# Patient Record
Sex: Female | Born: 1980 | Race: Black or African American | Hispanic: No | Marital: Married | Smoking: Never smoker
Health system: Southern US, Community
[De-identification: ages and names within clinical notes are randomized; demographics above are authoritative.]

## PROBLEM LIST (undated history)

## (undated) DIAGNOSIS — I639 Cerebral infarction, unspecified: Secondary | ICD-10-CM

## (undated) DIAGNOSIS — I10 Essential (primary) hypertension: Secondary | ICD-10-CM

## (undated) DIAGNOSIS — E785 Hyperlipidemia, unspecified: Secondary | ICD-10-CM

## (undated) DIAGNOSIS — I671 Cerebral aneurysm, nonruptured: Secondary | ICD-10-CM

## (undated) DIAGNOSIS — G935 Compression of brain: Secondary | ICD-10-CM

## (undated) DIAGNOSIS — I2699 Other pulmonary embolism without acute cor pulmonale: Secondary | ICD-10-CM

## (undated) DIAGNOSIS — J45909 Unspecified asthma, uncomplicated: Secondary | ICD-10-CM

## (undated) DIAGNOSIS — F419 Anxiety disorder, unspecified: Secondary | ICD-10-CM

## (undated) DIAGNOSIS — E039 Hypothyroidism, unspecified: Secondary | ICD-10-CM

## (undated) DIAGNOSIS — I499 Cardiac arrhythmia, unspecified: Secondary | ICD-10-CM

## (undated) DIAGNOSIS — I509 Heart failure, unspecified: Secondary | ICD-10-CM

## (undated) DIAGNOSIS — R0602 Shortness of breath: Secondary | ICD-10-CM

## (undated) DIAGNOSIS — M1990 Unspecified osteoarthritis, unspecified site: Secondary | ICD-10-CM

## (undated) HISTORY — DX: Compression of brain: G93.5

## (undated) HISTORY — DX: Anxiety disorder, unspecified: F41.9

## (undated) HISTORY — PX: CHOLECYSTECTOMY: SHX55

## (undated) HISTORY — DX: Cerebral infarction, unspecified: I63.9

## (undated) HISTORY — DX: Heart failure, unspecified: I50.9

## (undated) HISTORY — PX: TONSILLECTOMY: SUR1361

## (undated) HISTORY — DX: Shortness of breath: R06.02

## (undated) HISTORY — DX: Cerebral aneurysm, nonruptured: I67.1

## (undated) HISTORY — DX: Cardiac arrhythmia, unspecified: I49.9

## (undated) HISTORY — PX: EYE SURGERY: SHX253

## (undated) HISTORY — DX: Hyperlipidemia, unspecified: E78.5

## (undated) HISTORY — DX: Hypothyroidism, unspecified: E03.9

## (undated) HISTORY — DX: Essential (primary) hypertension: I10

## (undated) HISTORY — DX: Unspecified osteoarthritis, unspecified site: M19.90

---

## 2000-01-20 ENCOUNTER — Emergency Department (HOSPITAL_COMMUNITY): Admission: EM | Admit: 2000-01-20 | Discharge: 2000-01-20 | Payer: Self-pay | Admitting: Emergency Medicine

## 2000-01-21 ENCOUNTER — Emergency Department (HOSPITAL_COMMUNITY): Admission: EM | Admit: 2000-01-21 | Discharge: 2000-01-21 | Payer: Self-pay | Admitting: Emergency Medicine

## 2003-03-07 ENCOUNTER — Other Ambulatory Visit: Admission: RE | Admit: 2003-03-07 | Discharge: 2003-03-07 | Payer: Self-pay | Admitting: Gynecology

## 2003-09-04 ENCOUNTER — Other Ambulatory Visit: Admission: RE | Admit: 2003-09-04 | Discharge: 2003-09-04 | Payer: Self-pay | Admitting: Gynecology

## 2003-12-15 ENCOUNTER — Emergency Department (HOSPITAL_COMMUNITY): Admission: EM | Admit: 2003-12-15 | Discharge: 2003-12-15 | Payer: Self-pay | Admitting: Emergency Medicine

## 2004-03-25 ENCOUNTER — Other Ambulatory Visit: Admission: RE | Admit: 2004-03-25 | Discharge: 2004-03-25 | Payer: Self-pay | Admitting: Gynecology

## 2004-12-07 ENCOUNTER — Ambulatory Visit (HOSPITAL_COMMUNITY): Admission: RE | Admit: 2004-12-07 | Discharge: 2004-12-07 | Payer: Self-pay | Admitting: Gynecology

## 2004-12-07 ENCOUNTER — Encounter (INDEPENDENT_AMBULATORY_CARE_PROVIDER_SITE_OTHER): Payer: Self-pay | Admitting: Specialist

## 2004-12-07 ENCOUNTER — Ambulatory Visit (HOSPITAL_BASED_OUTPATIENT_CLINIC_OR_DEPARTMENT_OTHER): Admission: RE | Admit: 2004-12-07 | Discharge: 2004-12-07 | Payer: Self-pay | Admitting: Gynecology

## 2005-03-26 ENCOUNTER — Other Ambulatory Visit: Admission: RE | Admit: 2005-03-26 | Discharge: 2005-03-26 | Payer: Self-pay | Admitting: Gynecology

## 2005-11-12 ENCOUNTER — Other Ambulatory Visit: Admission: RE | Admit: 2005-11-12 | Discharge: 2005-11-12 | Payer: Self-pay | Admitting: Gynecology

## 2006-01-25 ENCOUNTER — Encounter (INDEPENDENT_AMBULATORY_CARE_PROVIDER_SITE_OTHER): Payer: Self-pay | Admitting: *Deleted

## 2006-01-25 ENCOUNTER — Inpatient Hospital Stay (HOSPITAL_COMMUNITY): Admission: AD | Admit: 2006-01-25 | Discharge: 2006-01-27 | Payer: Self-pay | Admitting: Gynecology

## 2006-03-11 ENCOUNTER — Other Ambulatory Visit: Admission: RE | Admit: 2006-03-11 | Discharge: 2006-03-11 | Payer: Self-pay | Admitting: Gynecology

## 2007-03-01 ENCOUNTER — Ambulatory Visit (HOSPITAL_COMMUNITY): Admission: RE | Admit: 2007-03-01 | Discharge: 2007-03-01 | Payer: Self-pay | Admitting: Gynecology

## 2007-04-10 ENCOUNTER — Other Ambulatory Visit: Admission: RE | Admit: 2007-04-10 | Discharge: 2007-04-10 | Payer: Self-pay | Admitting: Gynecology

## 2008-01-04 ENCOUNTER — Emergency Department (HOSPITAL_COMMUNITY): Admission: EM | Admit: 2008-01-04 | Discharge: 2008-01-04 | Payer: Self-pay | Admitting: Emergency Medicine

## 2008-04-18 ENCOUNTER — Other Ambulatory Visit: Admission: RE | Admit: 2008-04-18 | Discharge: 2008-04-18 | Payer: Self-pay | Admitting: Gynecology

## 2008-06-03 ENCOUNTER — Ambulatory Visit: Payer: Self-pay | Admitting: Gynecology

## 2008-07-24 ENCOUNTER — Ambulatory Visit: Payer: Self-pay | Admitting: Women's Health

## 2008-08-12 ENCOUNTER — Ambulatory Visit: Payer: Self-pay | Admitting: Women's Health

## 2008-09-30 ENCOUNTER — Ambulatory Visit: Payer: Self-pay | Admitting: Gynecology

## 2008-10-02 ENCOUNTER — Ambulatory Visit: Payer: Self-pay | Admitting: Gynecology

## 2009-04-21 ENCOUNTER — Other Ambulatory Visit: Admission: RE | Admit: 2009-04-21 | Discharge: 2009-04-21 | Payer: Self-pay | Admitting: Gynecology

## 2009-04-21 ENCOUNTER — Encounter: Payer: Self-pay | Admitting: Women's Health

## 2009-04-21 ENCOUNTER — Ambulatory Visit: Payer: Self-pay | Admitting: Women's Health

## 2009-06-02 ENCOUNTER — Ambulatory Visit: Payer: Self-pay | Admitting: Gynecology

## 2009-08-11 ENCOUNTER — Ambulatory Visit (HOSPITAL_COMMUNITY): Admission: RE | Admit: 2009-08-11 | Discharge: 2009-08-11 | Payer: Self-pay | Admitting: Obstetrics and Gynecology

## 2010-01-23 ENCOUNTER — Inpatient Hospital Stay (HOSPITAL_COMMUNITY): Admission: AD | Admit: 2010-01-23 | Discharge: 2010-01-23 | Payer: Self-pay | Admitting: Obstetrics and Gynecology

## 2010-02-01 ENCOUNTER — Inpatient Hospital Stay (HOSPITAL_COMMUNITY): Admission: AD | Admit: 2010-02-01 | Discharge: 2010-02-01 | Payer: Self-pay | Admitting: Obstetrics

## 2010-02-04 ENCOUNTER — Inpatient Hospital Stay (HOSPITAL_COMMUNITY): Admission: AD | Admit: 2010-02-04 | Discharge: 2010-02-06 | Payer: Self-pay | Admitting: Obstetrics and Gynecology

## 2010-02-17 ENCOUNTER — Inpatient Hospital Stay (HOSPITAL_COMMUNITY): Admission: AD | Admit: 2010-02-17 | Discharge: 2010-02-19 | Payer: Self-pay | Admitting: Obstetrics

## 2010-02-17 ENCOUNTER — Ambulatory Visit: Payer: Self-pay | Admitting: Critical Care Medicine

## 2010-02-17 ENCOUNTER — Ambulatory Visit: Payer: Self-pay | Admitting: Cardiology

## 2010-02-18 ENCOUNTER — Encounter (INDEPENDENT_AMBULATORY_CARE_PROVIDER_SITE_OTHER): Payer: Self-pay | Admitting: Obstetrics

## 2010-02-18 ENCOUNTER — Ambulatory Visit: Payer: Self-pay | Admitting: Vascular Surgery

## 2010-02-18 ENCOUNTER — Encounter (INDEPENDENT_AMBULATORY_CARE_PROVIDER_SITE_OTHER): Payer: Self-pay | Admitting: Obstetrics and Gynecology

## 2010-02-23 ENCOUNTER — Ambulatory Visit: Payer: Self-pay | Admitting: Cardiology

## 2010-02-23 LAB — CONVERTED CEMR LAB

## 2010-02-27 ENCOUNTER — Ambulatory Visit: Payer: Self-pay | Admitting: Cardiology

## 2010-02-27 LAB — CONVERTED CEMR LAB: POC INR: 2.2

## 2010-03-03 ENCOUNTER — Telehealth: Payer: Self-pay | Admitting: Critical Care Medicine

## 2010-03-03 ENCOUNTER — Ambulatory Visit: Payer: Self-pay | Admitting: Critical Care Medicine

## 2010-03-03 ENCOUNTER — Telehealth: Payer: Self-pay | Admitting: Cardiology

## 2010-03-03 ENCOUNTER — Ambulatory Visit: Payer: Self-pay | Admitting: Internal Medicine

## 2010-03-03 DIAGNOSIS — Z86718 Personal history of other venous thrombosis and embolism: Secondary | ICD-10-CM | POA: Insufficient documentation

## 2010-03-06 ENCOUNTER — Ambulatory Visit: Payer: Self-pay | Admitting: Cardiology

## 2010-03-16 ENCOUNTER — Ambulatory Visit: Payer: Self-pay | Admitting: Cardiology

## 2010-03-30 ENCOUNTER — Ambulatory Visit: Payer: Self-pay | Admitting: Cardiovascular Disease

## 2010-03-30 LAB — CONVERTED CEMR LAB: POC INR: 2.8

## 2010-04-14 ENCOUNTER — Telehealth (INDEPENDENT_AMBULATORY_CARE_PROVIDER_SITE_OTHER): Payer: Self-pay | Admitting: *Deleted

## 2010-04-20 ENCOUNTER — Ambulatory Visit: Payer: Self-pay | Admitting: Cardiology

## 2010-04-20 LAB — CONVERTED CEMR LAB: POC INR: 2.6

## 2010-05-01 ENCOUNTER — Telehealth (INDEPENDENT_AMBULATORY_CARE_PROVIDER_SITE_OTHER): Payer: Self-pay | Admitting: *Deleted

## 2010-05-05 ENCOUNTER — Encounter: Payer: Self-pay | Admitting: Critical Care Medicine

## 2010-05-05 ENCOUNTER — Ambulatory Visit: Payer: Self-pay | Admitting: Critical Care Medicine

## 2010-05-05 DIAGNOSIS — J45909 Unspecified asthma, uncomplicated: Secondary | ICD-10-CM | POA: Insufficient documentation

## 2010-05-18 ENCOUNTER — Ambulatory Visit: Payer: Self-pay | Admitting: Cardiovascular Disease

## 2010-05-19 ENCOUNTER — Ambulatory Visit: Payer: Self-pay | Admitting: Critical Care Medicine

## 2010-05-19 ENCOUNTER — Encounter: Payer: Self-pay | Admitting: Critical Care Medicine

## 2010-06-15 ENCOUNTER — Ambulatory Visit: Payer: Self-pay | Admitting: Cardiology

## 2010-06-17 ENCOUNTER — Telehealth: Payer: Self-pay | Admitting: Critical Care Medicine

## 2010-06-22 ENCOUNTER — Ambulatory Visit: Payer: Self-pay | Admitting: Critical Care Medicine

## 2010-07-13 ENCOUNTER — Ambulatory Visit: Payer: Self-pay | Admitting: Internal Medicine

## 2010-07-20 ENCOUNTER — Ambulatory Visit: Payer: Self-pay | Admitting: Cardiovascular Disease

## 2010-07-27 ENCOUNTER — Ambulatory Visit: Payer: Self-pay | Admitting: Cardiology

## 2010-08-03 ENCOUNTER — Ambulatory Visit: Payer: Self-pay | Admitting: Internal Medicine

## 2010-08-10 ENCOUNTER — Ambulatory Visit: Payer: Self-pay | Admitting: Cardiology

## 2010-08-10 LAB — CONVERTED CEMR LAB: POC INR: 3.3

## 2010-08-18 ENCOUNTER — Telehealth (INDEPENDENT_AMBULATORY_CARE_PROVIDER_SITE_OTHER): Payer: Self-pay | Admitting: *Deleted

## 2010-09-01 ENCOUNTER — Ambulatory Visit: Admission: RE | Admit: 2010-09-01 | Discharge: 2010-09-01 | Payer: Self-pay | Source: Home / Self Care

## 2010-09-01 LAB — CONVERTED CEMR LAB: POC INR: 3.3

## 2010-09-15 ENCOUNTER — Ambulatory Visit: Admission: RE | Admit: 2010-09-15 | Discharge: 2010-09-15 | Payer: Self-pay | Source: Home / Self Care

## 2010-09-29 NOTE — Assessment & Plan Note (Signed)
Summary: Pulmonary OV   Primary Kelsey Friedman Tool/Referring Kelsey Friedman:  [name]   CC:  2 wk follow up.  Pt states no improvement in breathing and still having "squeezing" feeling in chest.  .  History of Present Illness: Pulmonary OV  03/03/10: Hx of Pulmonary embolii  RUL.  Pt was postpartum.  The pt had prolonged bedrest due to dilated cervix prepartum.  Delivery was as expected vaginally.  Pt was d/c home only to return one week later with acute dyspnea 6/11.  Pt found to have RUL PE>  No DVT seen.  Pt rx with lovenox and coumadin.  Pt breastfeeding and cleared by pharmacy for coumadin.  Now noting more dyspnea and chest pain  starting one week ago and worse last two days. Notes some cough,  is dry.  Pain is on the L side.  BP is high. Pt is on the coumadin.  Notes some itching.  INR has been therapeutic per coumadin clinic.  May 05, 2010 2:50 PM When sleeps feels like throat will close off.  COmes and goes.  Still with L sided chest pain and some palpitations.  Stays on the coumadin.   INR 2.6 on 04/20/10. Notes some cough, and is dry.  No real pn drip.  No real wheeze.   03/03/10 CT was neg for PE vs 02/17/10 CT.  Pt notes some wheezing and dyspnea.  No prior hx of asthma.  No real mucus.  Notes some sinus drainage and sinus obstruction.  Pt works as a Office manager.  May 19, 2010 4:22 PM The pt feels the same.  There is not much improvement in dyspnea. No real wheeze.  Feels sore if takes a breath in.    Uses proair about twice a day at work.  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  The cough was present and now is gone.  The dyspnea is not as bad as before but is still present.    Asthma History    Initial Asthma Severity Rating:    Age range: 12+ years    Symptoms: daily    Nighttime Awakenings: 0-2/month    Interferes w/ normal activity: no limitations    SABA use (not for EIB): >2 days/week but not >1X/day    Exacerbations requiring oral systemic steroids: 0-1/year    Asthma Severity  Assessment: Moderate Persistent   Preventive Screening-Counseling & Management  Alcohol-Tobacco     Smoking Status: never  Current Medications (verified): 1)  Coumadin 5 Mg Tabs (Warfarin Sodium) .... Take As Directed By Anticoagulation Clinic. 2)  Pre-Natal Formula  Tabs (Prenatal Multivit-Min-Fe-Fa) .Marland Kitchen.. 1 Tablet By Mouth Daily 3)  Hydrochlorothiazide 25 Mg Tabs (Hydrochlorothiazide) .... Take 1 Tablet By Mouth Once A Day 4)  Meloxicam 15 Mg Tabs (Meloxicam) .... Take 1/2-1 Tablet Daily As Needed 5)  Asmanex 60 Metered Doses 220 Mcg/inh Aepb (Mometasone Furoate) .... Two Puff Daily 6)  Proair Hfa 108 (90 Base) Mcg/act  Aers (Albuterol Sulfate) .Marland Kitchen.. 1-2 Puffs Every 4-6 Hours As Needed  Allergies (verified): No Known Drug Allergies  Past History:  Past medical, surgical, family and social histories (including risk factors) reviewed, and no changes noted (except as noted below).  Past Medical History: Reviewed history from 03/03/2010 and no changes required. PULMONARY EMBOLISM, HX OF (ICD-V12.51)  Past Surgical History: Reviewed history from 03/03/2010 and no changes required. Tonsillectomy 2009 Fibroid removed 2005  Family History: Reviewed history from 03/03/2010 and no changes required. sister-cancer ?type father-deceased from heart attack  Social History: Reviewed history  from 03/03/2010 and no changes required. Patient never smoked.  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  The patient denies shortness of breath at rest, productive cough, coughing up blood, chest pain, irregular heartbeats, acid heartburn, indigestion, loss of appetite, weight change, abdominal pain, difficulty swallowing, sore throat, tooth/dental problems, headaches, nasal congestion/difficulty breathing through nose, sneezing, itching, ear ache, anxiety, depression, hand/feet swelling,  joint stiffness or pain, rash, change in color of mucus, and fever.    Vital Signs:  Patient profile:   30 year old female Height:      64 inches Weight:      182.38 pounds BMI:     31.42 O2 Sat:      98 % on Room air Temp:     98.3 degrees F oral Pulse rate:   89 / minute BP sitting:   118 / 88  (left arm) Cuff size:   regular  Vitals Entered By: Gweneth Dimitri RN (May 19, 2010 4:17 PM)  O2 Flow:  Room air CC: 2 wk follow up.  Pt states no improvement in breathing and still having "squeezing" feeling in chest.   Comments Medications reviewed with patient Daytime contact number verified with patient. Gweneth Dimitri RN  May 19, 2010 4:17 PM    Physical Exam  Additional Exam:  Gen: Pleasant, well-nourished, in no distress , normal affect ENT: no lesions, no post nasal drip Neck: No JVD, no TMG, no carotid bruits Lungs: No use of accessory muscles, no dullness to percussion,  no overt wheeze, improved  airflow Cardiovascular: RRR, heart sounds normal, no murmurs or gallops, no peripheral edema Abdomen: soft and non-tender, no HSM, BS normal Musculoskeletal: No deformities, no cyanosis or clubbing Neuro: alert, non-focal     Impression & Recommendations:  Problem # 1:  EXTRINSIC ASTHMA, UNSPECIFIED (ICD-493.00) Assessment Unchanged moderate persistent asthma,  HFA technique 50%  improved to 75% with coaching plan stay on asmanex two puff daily start foradil twice daily pulse prednisone  Problem # 2:  PULMONARY EMBOLISM, HX OF (ICD-V12.51) Assessment: Improved no evidence for pulmonary emboli recurrence plan cont coumadin Her updated medication list for this problem includes:    Coumadin 5 Mg Tabs (Warfarin sodium) .Marland Kitchen... Take as directed by anticoagulation clinic.  Orders: Est. Patient Level IV (81191)  Medications Added to Medication List This Visit: 1)  Foradil Aerolizer 12 Mcg Caps (Formoterol fumarate) .... One  capsule (1 puff) twice daily 2)   Prednisone 10 Mg Tabs (Prednisone) .... Take as directed take 4 daily for two days, then 3 daily for two days, then two daily for two days then one daily for two days then stop  Complete Medication List: 1)  Coumadin 5 Mg Tabs (Warfarin sodium) .... Take as directed by anticoagulation clinic. 2)  Pre-natal Formula Tabs (Prenatal multivit-min-fe-fa) .Marland Kitchen.. 1 tablet by mouth daily 3)  Hydrochlorothiazide 25 Mg Tabs (Hydrochlorothiazide) .... Take 1 tablet by mouth once a day 4)  Meloxicam 15 Mg Tabs (Meloxicam) .... Take 1/2-1 tablet daily as needed 5)  Asmanex 60 Metered Doses 220 Mcg/inh Aepb (Mometasone furoate) .... Two puff daily 6)  Proair Hfa 108 (90 Base) Mcg/act Aers (Albuterol sulfate) .Marland Kitchen.. 1-2 puffs every 4-6 hours as needed 7)  Foradil Aerolizer 12 Mcg Caps (Formoterol fumarate) .... One  capsule (1 puff) twice daily 8)  Prednisone 10 Mg Tabs (Prednisone) .... Take as  directed take 4 daily for two days, then 3 daily for two days, then two daily for two days then one daily for two days then stop  Other Orders: Spirometry w/Graph (94010) HFA Instruction [phone])  Patient Instructions: 1)  Start Foradil one capsule twice daily 2)  Start prednisone 10mg  Take 4 daily for two days, then 3 daily for two days, then two daily for two days then one daily for two days then stop 3)  Stay on asmanex two puff daily 4)  Return one month for recheck Prescriptions: PREDNISONE 10 MG  TABS (PREDNISONE) Take as directed Take 4 daily for two days, then 3 daily for two days, then two daily for two days then one daily for two days then stop  #20 x 0   Entered and Authorized by:   Storm Frisk MD   Signed by:   Storm Frisk MD on 05/19/2010   Method used:   [facility] to        [facility] [address] [phone]* (retail)       [address]       [place], [place]  [address]       Ph: [phone] or [phone]       Fax: [phone]   RxID:   [id number] FORADIL  AEROLIZER 12 MCG  CAPS (FORMOTEROL FUMARATE) One  capsule (1 puff) twice daily  #60 x 6   Entered and Authorized by:   Storm Frisk MD   Signed by:   Storm Frisk MD on 05/19/2010   Method used:   [facility]        [facility] [address] [phone]* (retail)       [address]       [place], [place]  [address]       Ph: [phone] or [phone]       Fax: [phone]   RxID:   [id number]   Appended Document: Pulmonary OV fax ron polite

## 2010-09-29 NOTE — Medication Information (Signed)
Summary: rov/ln  Anticoagulant Therapy  Managed by: Weston Brass, PharmD Referring MD: Ancil Boozer MD: Antoine Poche MD, Fayrene Fearing Indication 1: Pulmonary Embolism Lab Used: [facility] [place] Site: [address] INR POC 3.9 INR RANGE 2-3  Dietary changes: no    Health status changes: no    Bleeding/hemorrhagic complications: no    Recent/future hospitalizations: no    Any changes in medication regimen? no    Recent/future dental: no  Any missed doses?: no       Is patient compliant with meds? yes       Allergies: No Known Drug Allergies  Anticoagulation Management History:      The patient is taking warfarin and comes in today for a routine follow up visit.  Negative risk factors for bleeding include an age less than 54 years old.  The bleeding index is 'low risk'.  Negative CHADS2 values include Age > 17 years old.  Anticoagulation responsible provider: Antoine Poche MD, Fayrene Fearing.  INR POC: 3.9.  Cuvette Lot#: [id number].  Exp: 10/12.    Anticoagulation Management Assessment/Plan:      The patient's current anticoagulation dose is Coumadin 5 mg tabs: Take as directed by Anticoagulation Clinic..  The target INR is 2.0-3.0.  The next INR is due 03/30/2010.  Anticoagulation instructions were given to patient.  Results were reviewed/authorized by Weston Brass, PharmD.  She was notified by Dillard Cannon.         Prior Anticoagulation Instructions: INR 3.2  Take 1.5 tabs today. Change to 1.5 tabs on Sunday, Tuesday, Wednesday, Thursday, and Satuday and 2 tabs on Monday and Friday.    Current Anticoagulation Instructions: INR 3.9  Hold Coumadin today.  Then change to 1.5 tabs daily except for 2 tabs on Friday.  Re-check in 2 weeks.

## 2010-09-29 NOTE — Medication Information (Signed)
Summary: Kelsey Friedman  Anticoagulant Therapy  Managed by: Weston Brass, PharmD Referring MD: Delford Field PCP: [name]  Supervising MD: Excell Seltzer MD, Casimiro Needle Indication 1: Pulmonary Embolism Lab Used: [facility]  Site: [address] INR POC 2.4 INR RANGE 2-3  Dietary changes: no    Health status changes: yes       Details: Pt reports feeling cold lately in extremities.   Bleeding/hemorrhagic complications: yes       Details: Mirena inserted August 30th, and pt reports break through bleeding and spotting off and on since procedure.    Recent/future hospitalizations: no    Any changes in medication regimen? no    Recent/future dental: yes     Details: Oral surgery on Oct 21st (INR must be < 2.5  Any missed doses?: no       Is patient compliant with meds? yes       Allergies: No Known Drug Allergies  Anticoagulation Management History:      The patient is taking warfarin and comes in today for a routine follow up visit.  Negative risk factors for bleeding include an age less than 68 years old.  The bleeding index is 'low risk'.  Negative CHADS2 values include Age > 5 years old.  Anticoagulation responsible provider: Excell Seltzer MD, Casimiro Needle.  INR POC: 2.4.  Cuvette Lot#: [id number].  Exp: 07/2011.    Anticoagulation Management Assessment/Plan:      The patient's current anticoagulation dose is Coumadin 5 mg tabs: Take as directed by Anticoagulation Clinic..  The target INR is 2.0-3.0.  The next INR is due 07/13/2010.  Anticoagulation instructions were given to patient.  Results were reviewed/authorized by Weston Brass, PharmD.  She was notified by Haynes Hoehn, PharmD Candidate.         Prior Anticoagulation Instructions: INR 3.0  Take 1 tablet tonight (Monday). Then resume regular dose of 1 1/2 tablets everyday except 2 tablets on Friday. Re-check INR in 4 weeks.   Current Anticoagulation Instructions: INR 2.4  Continue Coumadin as scheduled:  1 and 1/2 tablets every  day of the week, except 2 tablets on Friday.  Return to clinic in 4 weeks.

## 2010-09-29 NOTE — Medication Information (Signed)
Summary: rov/mw  Anticoagulant Therapy  Managed by: Bethena Midget, RN, BSN Referring MD: Delford Field PCP: [name]  Supervising MD: Tenny Craw MD, Gunnar Fusi Indication 1: Pulmonary Embolism Lab Used: [facility] [place] Site: [address] INR POC 2.3 INR RANGE 2-3  Dietary changes: no    Health status changes: no    Bleeding/hemorrhagic complications: no    Recent/future hospitalizations: no    Any changes in medication regimen? no    Recent/future dental: no  Any missed doses?: no       Is patient compliant with meds? yes      Comments: Pt states she took 10mg s on Thursday of last week also  Allergies: No Known Drug Allergies  Anticoagulation Management History:      The patient is taking warfarin and comes in today for a routine follow up visit.  Negative risk factors for bleeding include an age less than 39 years old.  The bleeding index is 'low risk'.  Negative CHADS2 values include Age > 55 years old.  Anticoagulation responsible provider: Tenny Craw MD, Gunnar Fusi.  INR POC: 2.3.  Cuvette Lot#: [id number].  Exp: 05/2011.    Anticoagulation Management Assessment/Plan:      The patient's current anticoagulation dose is Coumadin 5 mg tabs: Take as directed by Anticoagulation Clinic..  The target INR is 2.0-3.0.  The next INR is due 08/10/2010.  Anticoagulation instructions were given to patient.  Results were reviewed/authorized by Bethena Midget, RN, BSN.  She was notified by Bethena Midget, RN, BSN.         Prior Anticoagulation Instructions: INR 1.1 Today take 2.5 tablets and tomorrow take 2 tablets. Take 1.5 tablets on tuesday and thursday. And 2 tablets all other days. recheck in 1 week.  Current Anticoagulation Instructions: INR 2.3 Change dose to 2 pills everyday except 1.5 pills on Tuesdays. Recheck in 7-10 days.

## 2010-09-29 NOTE — Medication Information (Signed)
Summary: rov/nb  Anticoagulant Therapy  Managed by: Lyna Poser, PharmD Referring MD: Delford Field PCP: [name]  Supervising MD: Riley Kill MD, Maisie Fus Indication 1: Pulmonary Embolism Lab Used: [facility] [place] Site: [address] INR POC 1.1 INR RANGE 2-3  Dietary changes: no    Health status changes: no    Bleeding/hemorrhagic complications: no    Recent/future hospitalizations: no    Any changes in medication regimen? no    Recent/future dental: no  Any missed doses?: no       Is patient compliant with meds? yes       Allergies: No Known Drug Allergies  Anticoagulation Management History:      The patient is taking warfarin and comes in today for a routine follow up visit.  Negative risk factors for bleeding include an age less than 37 years old.  The bleeding index is 'low risk'.  Negative CHADS2 values include Age > 22 years old.  Anticoagulation responsible provider: Riley Kill MD, Maisie Fus.  INR POC: 1.1.  Cuvette Lot#: [id number].  Exp: 07/2011.    Anticoagulation Management Assessment/Plan:      The patient's current anticoagulation dose is Coumadin 5 mg tabs: Take as directed by Anticoagulation Clinic..  The target INR is 2.0-3.0.  The next INR is due 08/03/2010.  Anticoagulation instructions were given to patient.  Results were reviewed/authorized by Lyna Poser, PharmD.         Prior Anticoagulation Instructions: INR 1.4 Take 2.5 tablets today, 2 tablets on Tuesdat then take 1.5 tablets everyday except 2 tablets on Monday, Wednesday, and Friday Recheck INR in 1 week  Current Anticoagulation Instructions: INR 1.1 Today take 2.5 tablets and tomorrow take 2 tablets. Take 1.5 tablets on tuesday and thursday. And 2 tablets all other days. recheck in 1 week.

## 2010-09-29 NOTE — Progress Notes (Signed)
Summary: having itching,sob, chest pain   Phone Note Call from Patient   Caller: Patient [phone] Reason for Call: Talk to Nurse Summary of Call: pt having itching, sob, and left sided chest pain x 1wk, worse last 2 days the itching kept her up last night-pls call [phone] Initial call taken by: Glynda Jaeger,  March 03, 2010 9:32 AM  Follow-up for Phone Call        I called and spoke with the pt. She states she developed itching about a week ago. She denies a rash. This started about the same time that she started her coumadin. She does take brand name coumadin. I attempted to contact [facility] and they are with pt's at this time. I will discuss the itching with our pharmacist and call her back. I have advised the pt to call Dr. Lynelle Doctor office regarding her SOB and left sided chest pain. She has an appt. with them tomorrow. I have advised her to call to make sure they do not want to see her today. I will call her back after I speak with the pharmacist.  Follow-up by: Sherri Rad, RN, BSN,  March 03, 2010 10:11 AM  Additional Follow-up for Phone Call Additional follow up Details #1::        I spoke with Sally-Pharm D. She states it is unlikely that the pt is having a rxn to coumadin since she is not having a rash and the itching is intermittent. I have relayed this to the pt. She will update [facility] on friday when she comes for a repeat INR check as to how she is feeling. Additional Follow-up by: Sherri Rad, RN, BSN,  March 04, 2010 2:45 PM

## 2010-09-29 NOTE — Medication Information (Signed)
Summary: rov/cs  Anticoagulant Therapy  Managed by: Weston Brass, PharmD Referring MD: Delford Field PCP: [name]  Supervising MD: Ladona Ridgel MD, Sharlot Gowda Indication 1: Pulmonary Embolism Lab Used: [facility] [place] Site: [address] INR POC 1.0 INR RANGE 2-3  Dietary changes: no    Health status changes: no    Bleeding/hemorrhagic complications: no    Recent/future hospitalizations: no    Any changes in medication regimen? no    Recent/future dental: no  Any missed doses?: no       Is patient compliant with meds? yes       Allergies (verified): No Known Drug Allergies  Anticoagulation Management History:      The patient is taking warfarin and comes in today for a routine follow up visit.  Negative risk factors for bleeding include an age less than 35 years old.  The bleeding index is 'low risk'.  Negative CHADS2 values include Age > 31 years old.  Anticoagulation responsible provider: Ladona Ridgel MD, Sharlot Gowda.  INR POC: 1.0.  Cuvette Lot#: [id number].  Exp: 07/2011.    Anticoagulation Management Assessment/Plan:      The patient's current anticoagulation dose is Coumadin 5 mg tabs: Take as directed by Anticoagulation Clinic..  The target INR is 2.0-3.0.  The next INR is due 07/20/2010.  Anticoagulation instructions were given to patient.  Results were reviewed/authorized by Weston Brass, PharmD.  She was notified by Hoy Register, PharmD Candidate.         Prior Anticoagulation Instructions: INR 2.4  Continue Coumadin as scheduled:  1 and 1/2 tablets every day of the week, except 2 tablets on Friday.  Return to clinic in 4 weeks.    Current Anticoagulation Instructions: INR 1.0 Take 2.5 tablets today and 2 tablets  tomorrow then resume previous dose of 1.5 tablets everyday except 2 tablets on Friday Recheck INR in 1 weeks

## 2010-09-29 NOTE — Medication Information (Signed)
Summary: rov/nb  Anticoagulant Therapy  Managed by: Weston Brass, PharmD Referring MD: Delford Field PCP: [name]  Supervising MD: Excell Seltzer MD, Casimiro Needle Indication 1: Pulmonary Embolism Lab Used: [facility] [place] Site: [address] INR POC 1.4 INR RANGE 2-3  Dietary changes: no    Health status changes: yes       Details: C/o SOB w/ chest pain today, resting resolved  Bleeding/hemorrhagic complications: no    Recent/future hospitalizations: no    Any changes in medication regimen? no    Recent/future dental: no  Any missed doses?: no       Is patient compliant with meds? yes      Comments: O2 sats: 98% to r/o PE; HR 98  Allergies: No Known Drug Allergies  Anticoagulation Management History:      The patient is taking warfarin and comes in today for a routine follow up visit.  Negative risk factors for bleeding include an age less than 37 years old.  The bleeding index is 'low risk'.  Negative CHADS2 values include Age > 31 years old.  Anticoagulation responsible provider: Excell Seltzer MD, Casimiro Needle.  INR POC: 1.4.  Cuvette Lot#: [id number].  Exp: 07/2011.    Anticoagulation Management Assessment/Plan:      The patient's current anticoagulation dose is Coumadin 5 mg tabs: Take as directed by Anticoagulation Clinic..  The target INR is 2.0-3.0.  The next INR is due 07/27/2010.  Anticoagulation instructions were given to patient.  Results were reviewed/authorized by Weston Brass, PharmD.  She was notified by Hoy Register, PharmD Candidate.         Prior Anticoagulation Instructions: INR 1.0 Take 2.5 tablets today and 2 tablets  tomorrow then resume previous dose of 1.5 tablets everyday except 2 tablets on Friday Recheck INR in 1 weeks  Current Anticoagulation Instructions: INR 1.4 Take 2.5 tablets today, 2 tablets on Tuesdat then take 1.5 tablets everyday except 2 tablets on Monday, Wednesday, and Friday Recheck INR in 1 week

## 2010-09-29 NOTE — Progress Notes (Signed)
Summary: waiting on coumadin rx  Phone Note Call from Patient   Caller: Patient Call For: wright Summary of Call: pt only has 1 tab of coumadin remaining. needs this called in asap. i advised pt that per crystal she will check w/ dr Delford Field and call pharmacy now. if not approved, please call pt at [phone] Initial call taken by: Tivis Ringer, CNA,  April 14, 2010 5:24 PM  Follow-up for Phone Call        [facility] per PW to fill coumadin as stated in coumadin rx request.  [facility] -- she verbalized understanding.    Follow-up by: Gweneth Dimitri RN,  April 14, 2010 5:27 PM

## 2010-09-29 NOTE — Medication Information (Signed)
Summary: rov/cb  Anticoagulant Therapy  Managed by: Weston Brass, PharmD Referring MD: Ancil Boozer MD: Juanda Chance MD, Janellie Tennison Indication 1: Pulmonary Embolism Lab Used: [facility] [place] Site: [address] INR POC 2.2 INR RANGE 2-3  Dietary changes: no    Health status changes: no    Bleeding/hemorrhagic complications: no    Recent/future hospitalizations: no    Any changes in medication regimen? yes       Details: lovenox d/c  Recent/future dental: no  Any missed doses?: no       Is patient compliant with meds? yes       Anticoagulation Management History:      The patient is taking warfarin and comes in today for a routine follow up visit.  Negative risk factors for bleeding include an age less than 98 years old.  The bleeding index is 'low risk'.  Negative CHADS2 values include Age > 38 years old.  Anticoagulation responsible provider: Juanda Chance MD, Smitty Cords.  INR POC: 2.2.  Cuvette Lot#: [id number].  Exp: 09/12.    Anticoagulation Management Assessment/Plan:      The patient's current anticoagulation dose is Coumadin 5 mg tabs: Take as directed by Anticoagulation Clinic..  The target INR is 2.0-3.0.  The next INR is due 03/06/2010.  Anticoagulation instructions were given to patient.  Results were reviewed/authorized by Weston Brass, PharmD.  She was notified by Weston Brass PharmD.         Prior Anticoagulation Instructions: INR 1.9. Give Lovenox 80 mg tonight and tomorrow morning, then stop Lovenox. Take Coumadin 10 mg tonight and tomorrow, then take 7.5 mg daily (1 1/2 tablets). Recheck on Friday at 10:15.  Current Anticoagulation Instructions: INR 2.2  Change dose to 1 1/2 tablets every day except 2 tablets on Monday, Wednesday and Friday.

## 2010-09-29 NOTE — Medication Information (Signed)
Summary: new to coumadin/PE/post partum 6/8  Anticoagulant Therapy  Managed by: Elaina Pattee, PharmD Supervising MD: Jens Som MD, Arlys John Indication 1: Pulmonary Embolism [place] Site: [address] INR POC 1.9 INR RANGE 2-3  Dietary changes: yes       Details: Educated on appropriate management of vit-K containing foods.  Health status changes: yes       Details: Educated on signs/sx to report. Pt is SOB at night since hospitalization. Explained this can be normal, but it worsens to call MD.  Bleeding/hemorrhagic complications: yes       Details: Educated on signs/sx of bleeding and what to do in case of falls.  Recent/future hospitalizations: yes       Details: Educated on informing providers of Coumadin.  Any changes in medication regimen? yes       Details: Educated on informing us of medication changes and appropriate OTC meds.  Recent/future dental: yes     Details: Educated on procedure for dental work.  Any missed doses?: yes     Details: Educated on importance of adherence to regimen.  Is patient compliant with meds? yes       Current Medications (verified): 1)  Coumadin 5 Mg Tabs (Warfarin Sodium) .... Take As Directed By Anticoagulation Clinic. 2)  Pre-Natal Formula  Tabs (Prenatal Multivit-Min-Fe-Fa) .Marland Kitchen.. 1 Tablet By Mouth Daily  Anticoagulation Management History:      The patient comes in today for her initial visit for anticoagulation therapy.  Negative risk factors for bleeding include an age less than 59 years old.  The bleeding index is 'low risk'.  Negative CHADS2 values include Age > 69 years old.  Anticoagulation responsible provider: Jens Som MD, Arlys John.  INR POC: 1.9.  Cuvette Lot#: [id number].  Exp: 03/2011.    Anticoagulation Management Assessment/Plan:      The patient's current anticoagulation dose is Coumadin 5 mg tabs: Take as directed by Anticoagulation Clinic..  The next INR is due 02/27/2010.  Results were reviewed/authorized by Elaina Pattee, PharmD.   She was notified by Elaina Pattee, PharmD.         Current Anticoagulation Instructions: INR 1.9. Give Lovenox 80 mg tonight and tomorrow morning, then stop Lovenox. Take Coumadin 10 mg tonight and tomorrow, then take 7.5 mg daily (1 1/2 tablets). Recheck on Friday at 10:15.

## 2010-09-29 NOTE — Assessment & Plan Note (Signed)
Summary: Pulmonary OV   Primary Yariana Hoaglund/Referring Alta Goding:  [name]   CC:  1 month follow up.  Pt states breathing has improved but still having a pulling sensation in upper left chest.  Denies wheezing, chest tightness, and cough.  .  History of Present Illness: Pulmonary OV  03/03/10: Hx of Pulmonary embolii  RUL.  Pt was postpartum.  The pt had prolonged bedrest due to dilated cervix prepartum.  Delivery was as expected vaginally.  Pt was d/c home only to return one week later with acute dyspnea 6/11.  Pt found to have RUL PE>  No DVT seen.  Pt rx with lovenox and coumadin.  Pt breastfeeding and cleared by pharmacy for coumadin.  Now noting more dyspnea and chest pain  starting one week ago and worse last two days. Notes some cough,  is dry.  Pain is on the L side.  BP is high. Pt is on the coumadin.  Notes some itching.  INR has been therapeutic per coumadin clinic.  May 05, 2010 2:50 PM When sleeps feels like throat will close off.  COmes and goes.  Still with L sided chest pain and some palpitations.  Stays on the coumadin.   INR 2.6 on 04/20/10. Notes some cough, and is dry.  No real pn drip.  No real wheeze.   03/03/10 CT was neg for PE vs 02/17/10 CT.  Pt notes some wheezing and dyspnea.  No prior hx of asthma.  No real mucus.  Notes some sinus drainage and sinus obstruction.  Pt works as a Office manager.  May 19, 2010 4:22 PM The pt feels the same.  There is not much improvement in dyspnea. No real wheeze.  Feels sore if takes a breath in.    Uses proair about twice a day at work.  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  The cough was present and now is gone.  The dyspnea is not as bad as before but is still present.  June 22, 2010 4:23 PM The pt feels some better.  There is now not much cough.  No real chest pain, but not as sore as before  The pt is now on foradil and has helped,  also on asmanex daily The pt is off prednisone.  No other new issues.   Current Medications  (verified): 1)  Coumadin 5 Mg Tabs (Warfarin Sodium) .... Take As Directed By Anticoagulation Clinic. 2)  Pre-Natal Formula  Tabs (Prenatal Multivit-Min-Fe-Fa) .Marland Kitchen.. 1 Tablet By Mouth Daily 3)  Hydrochlorothiazide 25 Mg Tabs (Hydrochlorothiazide) .... Take 1 Tablet By Mouth Once A Day 4)  Meloxicam 15 Mg Tabs (Meloxicam) .... Take 1/2-1 Tablet Daily As Needed 5)  Asmanex 60 Metered Doses 220 Mcg/inh Aepb (Mometasone Furoate) .... Two Puff Daily 6)  Proair Hfa 108 (90 Base) Mcg/act  Aers (Albuterol Sulfate) .Marland Kitchen.. 1-2 Puffs Every 4-6 Hours As Needed 7)  Foradil Aerolizer 12 Mcg Caps (Formoterol Fumarate) .... Inhale 1 Puff Two Times A Day  Allergies (verified): No Known Drug Allergies  Past History:  Past medical, surgical, family and social histories (including risk factors) reviewed, and no changes noted (except as noted below).  Past Medical History: Reviewed history from 03/03/2010 and no changes required. PULMONARY EMBOLISM, HX OF (ICD-V12.51)  Past Surgical History: Reviewed history from 03/03/2010 and no changes required. Tonsillectomy 2009 Fibroid removed 2005  Family History: Reviewed history from 03/03/2010 and no changes required. sister-cancer ?type father-deceased from heart attack  Social History: Reviewed history from 03/03/2010 and  no changes required. Patient never smoked.  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    Vital Signs:  Patient  profile:   30 year old female Height:      64 inches Weight:      185.13 pounds BMI:     31.89 O2 Sat:      98 % on Room air Temp:     97.7 degrees F oral Pulse rate:   85 / minute BP sitting:   120 / 84  (left arm) Cuff size:   regular  Vitals Entered By: Gweneth Dimitri RN (June 22, 2010 4:02 PM)  O2 Flow:  Room air CC: 1 month follow up.  Pt states breathing has improved but still having a pulling sensation in upper left chest.  Denies wheezing, chest tightness, cough.   Comments Medications reviewed with patient Daytime contact number verified with patient. Gweneth Dimitri RN  June 22, 2010 4:04 PM    Physical Exam  Additional Exam:  Gen: Pleasant, well-nourished, in no distress , normal affect ENT: no lesions, no post nasal drip Neck: No JVD, no TMG, no carotid bruits Lungs: No use of accessory muscles, no dullness to percussion,  no overt wheeze, improved  airflow Cardiovascular: RRR, heart sounds normal, no murmurs or gallops, no peripheral edema Abdomen: soft and non-tender, no HSM, BS normal Musculoskeletal: No deformities, no cyanosis or clubbing Neuro: alert, non-focal     Impression & Recommendations:  Problem # 1:  PULMONARY EMBOLISM, HX OF (ICD-V12.51) resolved plan  coumadin to continue to end of Jan 2012 Her updated medication list for this problem includes:    Coumadin 5 Mg Tabs (Warfarin sodium) .Marland Kitchen... Take as directed by anticoagulation clinic.  Orders: Est. Patient Level III (69629)  Problem # 2:  EXTRINSIC ASTHMA, UNSPECIFIED (ICD-493.00) Assessment: Improved  moderate persistent asthma,  improved with foradil  plan stay on asmanex two puff daily cont foradil twice daily  Medications Added to Medication List This Visit: 1)  Foradil Aerolizer 12 Mcg Caps (Formoterol fumarate) .... Inhale 1 puff two times a day  Complete Medication List: 1)  Coumadin 5 Mg Tabs (Warfarin sodium) .... Take as directed by anticoagulation clinic. 2)   Pre-natal Formula Tabs (Prenatal multivit-min-fe-fa) .Marland Kitchen.. 1 tablet by mouth daily 3)  Hydrochlorothiazide 25 Mg Tabs (Hydrochlorothiazide) .... Take 1 tablet by mouth once a day 4)  Meloxicam 15 Mg Tabs (Meloxicam) .... Take 1/2-1 tablet daily as needed 5)  Asmanex 60 Metered Doses 220 Mcg/inh Aepb (Mometasone furoate) .... Two puff daily 6)  Proair Hfa 108 (90 Base) Mcg/act Aers (Albuterol sulfate) .Marland Kitchen.. 1-2 puffs every 4-6 hours as needed 7)  Foradil Aerolizer 12 Mcg Caps (Formoterol fumarate) .... Inhale 1 puff two times a day  Patient Instructions: 1)  No change in medications 2)  Return in     3     months   Immunization History:  Influenza Immunization History:    Influenza:  historical (05/25/2010)   Appended Document: Pulmonary OV fax ron polite

## 2010-09-29 NOTE — Progress Notes (Signed)
Summary: call report-FYI  Phone Note Other Incoming   Caller: Rose from CT ext [phone] Summary of Call: Received call report from [facility] in [place] advising pt does not show PE or edema. CT report reviewed by "doc of the day" Dr. Kriste Basque and advised okay for pt to leave. I called Rose and advised same Initial call taken by: Zackery Barefoot CMA,  March 03, 2010 3:35 PM  Follow-up for Phone Call        noted pt aware of result  Follow-up by: Storm Frisk MD,  March 04, 2010 9:29 AM

## 2010-09-29 NOTE — Medication Information (Signed)
Summary: Kelsey Friedman  Anticoagulant Therapy  Managed by: Weston Brass, PharmD Referring MD: Delford Field PCP: [name]  Supervising MD: Excell Seltzer MD, Casimiro Needle Indication 1: Pulmonary Embolism Lab Used: [facility] [place] Site: [address] INR POC 3.0 INR RANGE 2-3  Dietary changes: no    Health status changes: no    Bleeding/hemorrhagic complications: no     Any changes in medication regimen? yes       Details: Initated Asmanex and Proair  Recent/future dental: no  Any missed doses?: no       Is patient compliant with meds? yes       Allergies: No Known Drug Allergies  Anticoagulation Management History:      The patient is taking warfarin and comes in today for a routine follow up visit.  Negative risk factors for bleeding include an age less than 3 years old.  The bleeding index is 'low risk'.  Negative CHADS2 values include Age > 18 years old.  Anticoagulation responsible provider: Excell Seltzer MD, Casimiro Needle.  INR POC: 3.0.  Cuvette Lot#: [id number].  Exp: 07/2011.    Anticoagulation Management Assessment/Plan:      The patient's current anticoagulation dose is Coumadin 5 mg tabs: Take as directed by Anticoagulation Clinic..  The target INR is 2.0-3.0.  The next INR is due 06/15/2010.  Anticoagulation instructions were given to patient.  Results were reviewed/authorized by Weston Brass, PharmD.  She was notified by Harrel Carina, PharmD candidate.         Prior Anticoagulation Instructions: INR 2.6  Continue taking 1.5 tablets (7.5mg ) every day except take 2 tablets (10mg ) on Fridays.  Recheck in 4 weeks.    Current Anticoagulation Instructions: INR 3.0  Take 1 tablet tonight (Monday). Then resume regular dose of 1 1/2 tablets everyday except 2 tablets on Friday. Re-check INR in 4 weeks.

## 2010-09-29 NOTE — Progress Notes (Signed)
Summary: OV  Phone Note Outgoing Call   Call placed by: Gweneth Dimitri RN,  May 01, 2010 4:55 PM Call placed to: Patient Summary of Call: Checking charts for Tuesday -- pt was scheduled yesterday for acute visit with PW for sxs of SOB and feeling like throat was closing.   ATC pt's home number but spoke with family member who stated she was out of town but I could reach her on cell. Called pt's cell number.  Spoke to her.  She stated she felt like she could wait until Tuesday to be seen.  Advised ER/Urgent Care if sxs worsen before OV.  She verbalized understanding.   Initial call taken by: Gweneth Dimitri RN,  May 01, 2010 4:58 PM

## 2010-09-29 NOTE — Medication Information (Signed)
Summary: rov/ln  Anticoagulant Therapy  Managed by: Cloyde Reams, RN, BSN Referring MD: Ancil Boozer MD: Eden Emms MD, Theron Arista Indication 1: Pulmonary Embolism Lab Used: [facility] [place] Site: [address] INR POC 2.8 INR RANGE 2-3  Dietary changes: no    Health status changes: no    Bleeding/hemorrhagic complications: no    Recent/future hospitalizations: no    Any changes in medication regimen? yes       Details: Hydrochlorothiazide 25mg  and Meloxicam 15mg .   Recent/future dental: no  Any missed doses?: no       Is patient compliant with meds? yes       Current Medications (verified): 1)  Coumadin 5 Mg Tabs (Warfarin Sodium) .... Take As Directed By Anticoagulation Clinic. 2)  Pre-Natal Formula  Tabs (Prenatal Multivit-Min-Fe-Fa) .Marland Kitchen.. 1 Tablet By Mouth Daily 3)  Hydrochlorothiazide 25 Mg Tabs (Hydrochlorothiazide) .... Take 1 Tablet By Mouth Once A Day 4)  Meloxicam 15 Mg Tabs (Meloxicam) .... Take 1/2-1 Tablet Daily  Allergies: No Known Drug Allergies  Anticoagulation Management History:      The patient is taking warfarin and comes in today for a routine follow up visit.  Negative risk factors for bleeding include an age less than 56 years old.  The bleeding index is 'low risk'.  Negative CHADS2 values include Age > 75 years old.  Anticoagulation responsible provider: Eden Emms MD, Theron Arista.  INR POC: 2.8.  Cuvette Lot#: [id number].  Exp: 05/2011.    Anticoagulation Management Assessment/Plan:      The patient's current anticoagulation dose is Coumadin 5 mg tabs: Take as directed by Anticoagulation Clinic..  The target INR is 2.0-3.0.  The next INR is due 04/20/2010.  Anticoagulation instructions were given to patient.  Results were reviewed/authorized by Cloyde Reams, RN, BSN.  She was notified by Cloyde Reams RN.         Prior Anticoagulation Instructions: INR 3.9  Hold Coumadin today.  Then change to 1.5 tabs daily except for 2 tabs on Friday.   Re-check in 2 weeks.    Current Anticoagulation Instructions: INR 2.8  Continue on same dosage 1.5 tablets daily except 2 tablets on Fridays.  Recheck in 3 weeks.

## 2010-09-29 NOTE — Medication Information (Signed)
Summary: rov/sp  Anticoagulant Therapy  Managed by: Weston Brass, PharmD Referring MD: Ancil Boozer MD: Antoine Poche MD, Fayrene Fearing Indication 1: Pulmonary Embolism Lab Used: [facility] [place] Site: [address] INR POC 3.2 INR RANGE 2-3  Dietary changes: no    Health status changes: no    Bleeding/hemorrhagic complications: no    Recent/future hospitalizations: no    Any changes in medication regimen? no    Recent/future dental: no  Any missed doses?: no       Is patient compliant with meds? yes       Allergies: No Known Drug Allergies  Anticoagulation Management History:      The patient is taking warfarin and comes in today for a routine follow up visit.  Negative risk factors for bleeding include an age less than 46 years old.  The bleeding index is 'low risk'.  Negative CHADS2 values include Age > 46 years old.  Anticoagulation responsible provider: Antoine Poche MD, Fayrene Fearing.  INR POC: 3.2.  Cuvette Lot#: [id number].  Exp: 09/12.    Anticoagulation Management Assessment/Plan:      The patient's current anticoagulation dose is Coumadin 5 mg tabs: Take as directed by Anticoagulation Clinic..  The target INR is 2.0-3.0.  The next INR is due 03/16/2010.  Anticoagulation instructions were given to patient.  Results were reviewed/authorized by Weston Brass, PharmD.  She was notified by Dillard Cannon.         Prior Anticoagulation Instructions: INR 2.2  Change dose to 1 1/2 tablets every day except 2 tablets on Monday, Wednesday and Friday.   Current Anticoagulation Instructions: INR 3.2  Take 1.5 tabs today. Change to 1.5 tabs on Sunday, Tuesday, Wednesday, Thursday, and Satuday and 2 tabs on Monday and Friday.

## 2010-09-29 NOTE — Assessment & Plan Note (Signed)
Summary: Pulmonary OV   CC:  Post Hospital Follow up.  Pt states breathing is worse since discharge.  States she is having increased SOB at rest, with activity, and and at night.  Pt also c/o left sided chest pain.  States pain does radiate at times to back and arm.  Marland Kitchen  History of Present Illness: Pulmonary Post Hospital  Hx of Pulmonary embolii  RUL.  Pt was postpartum.  The pt had prolonged bedrest due to dilated cervix prepartum.  Delivery was as expected vaginally.  Pt was d/c home only to return one week later with acute dyspnea 6/11.  Pt found to have RUL PE>  No DVT seen.  Pt rx with lovenox and coumadin.  Pt breastfeeding and cleared by pharmacy for coumadin.  Now noting more dyspnea and chest pain  starting one week ago and worse last two days. Notes some cough,  is dry.  Pain is on the L side.  BP is high. Pt is on the coumadin.  Notes some itching.  INR has been therapeutic per coumadin clinic.  Preventive Screening-Counseling & Management  Alcohol-Tobacco     Smoking Status: never  Current Medications (verified): 1)  Coumadin 5 Mg Tabs (Warfarin Sodium) .... Take As Directed By Anticoagulation Clinic. 2)  Pre-Natal Formula  Tabs (Prenatal Multivit-Min-Fe-Fa) .Marland Kitchen.. 1 Tablet By Mouth Daily  Allergies (verified): No Known Drug Allergies  Past History:  Past medical, surgical, family and social histories (including risk factors) reviewed, and no changes noted (except as noted below).  Past Medical History: PULMONARY EMBOLISM, HX OF (ICD-V12.51)  Past Surgical History: Tonsillectomy 2009 Fibroid removed 2005  Family History: Reviewed history and no changes required. sister-cancer ?type father-deceased from heart attack  Social History: Reviewed history and no changes required. Patient never smoked.  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  The patient denies productive cough, coughing up blood, irregular heartbeats, acid heartburn, indigestion, loss of appetite, weight change, abdominal pain, difficulty swallowing, sore throat, tooth/dental problems, headaches, nasal congestion/difficulty breathing through nose, sneezing, itching, ear ache, anxiety, depression, hand/feet swelling, joint stiffness or pain, rash, change in color of mucus, and fever.    Vital Signs:  Patient profile:   30 year old female Height:      64 inches Weight:      172 pounds BMI:     29.63 O2 Sat:      97 % on Room air Temp:     98.3 degrees F oral Pulse rate:   82 / minute BP sitting:   140 / 100  (left arm) Cuff size:   regular  Vitals Entered By: Gweneth Dimitri RN (March 03, 2010 10:59 AM)  O2 Flow:  Room air CC: [facility] Follow up.  Pt states breathing is worse since discharge.  States she is having increased SOB at rest, with activity, and at night.  Pt also c/o left sided chest pain.  States pain does radiate at times to back and arm.   Comments Medications reviewed with patient Daytime contact number verified with patient. Crystal Jones RN  March 03, 2010 10:58 AM    Physical Exam  Additional Exam:  Gen: Pleasant, well-nourished, in no distress , normal affect ENT: no lesions, no post nasal drip Neck: No JVD, no TMG, no carotid  bruits Lungs: No use of accessory muscles, no dullness to percussion, clear without rales or rhonchi Cardiovascular: RRR, heart sounds normal, no murmurs or gallops, no peripheral edema Abdomen: soft and non-tender, no HSM, BS normal Musculoskeletal: No deformities, no cyanosis or clubbing Neuro: alert, non-focal     CT of Chest  Procedure date:  03/03/2010  Findings:      IMPRESSION:   1.  No CT findings for pulmonary embolism. 2.  No significant pulmonary findings. 3.  Normal thoracic aorta.   Impression & Recommendations:  Problem # 1:   PULMONARY EMBOLISM, HX OF (ICD-V12.51) Assessment Improved Hx of Pulmonary embolism postpartum.  Dyspnea continues worrisome for recurrent PE despite ongoing coumadin use. However urgent CT angio today demonstrates RESOLUTION of PE and no new clots. suspect current symptoms due to postpartum atelectasis or gerd plan cont coumadin  no other change in treatment needs coumadin for 6months Her updated medication list for this problem includes:    Coumadin 5 Mg Tabs (Warfarin sodium) .Marland Kitchen... Take as directed by anticoagulation clinic.  Orders: Est. Patient Level IV (16109) Radiology Referral (Radiology)  Complete Medication List: 1)  Coumadin 5 Mg Tabs (Warfarin sodium) .... Take as directed by anticoagulation clinic. 2)  Pre-natal Formula Tabs (Prenatal multivit-min-fe-fa) .Marland Kitchen.. 1 tablet by mouth daily  Patient Instructions: 1)  Stay on coumadin 2)  Obtain a CT chest today, we will notify you of the results after the scan is completed prior to your release from the scanner site   Immunization History:  Influenza Immunization History:    Influenza:  historical (06/30/2009)

## 2010-09-29 NOTE — Assessment & Plan Note (Signed)
Summary: Pulmonary OV   Primary [name]/Referring [name]:  Kelsey Friedman   CC:  Acute Visit.  increased SOB "all the time, "  palpitations, achy pains in chest, and dizziness at times with activity x 1 1/2 wks.  Feeling of throat closing x 1 night approx 1 1/2 wks ago.  Would like flu vac today.Kelsey Friedman  History of Present Illness: Pulmonary OV  03/03/10: Hx of Pulmonary embolii  RUL.  Pt was postpartum.  The pt had prolonged bedrest due to dilated cervix prepartum.  Delivery was as expected vaginally.  Pt was d/c home only to return one week later with acute dyspnea 6/11.  Pt found to have RUL PE>  No DVT seen.  Pt rx with lovenox and coumadin.  Pt breastfeeding and cleared by pharmacy for coumadin.  Now noting more dyspnea and chest pain  starting one week ago and worse last two days. Notes some cough,  is dry.  Pain is on the L side.  BP is high. Pt is on the coumadin.  Notes some itching.  INR has been therapeutic per coumadin clinic.  May 05, 2010 2:50 PM When sleeps feels like throat will close off.  COmes and goes.  Still with L sided chest pain and some palpitations.  Stays on the coumadin.   INR 2.6 on 04/20/10. Notes some cough, and is dry.  No real pn drip.  No real wheeze.   03/03/10 CT was neg for PE vs 02/17/10 CT.  Pt notes some wheezing and dyspnea.  No prior hx of asthma.  No real mucus.  Notes some sinus drainage and sinus obstruction.  Pt works as a Office manager.    Preventive Screening-Counseling & Management  Alcohol-Tobacco     Smoking Status: never  Current Medications (verified): 1)  Coumadin 5 Mg Tabs (Warfarin Sodium) .... Take As Directed By Anticoagulation Clinic. 2)  Pre-Natal Formula  Tabs (Prenatal Multivit-Min-Fe-Fa) .Kelsey Friedman.. 1 Tablet By Mouth Daily 3)  Hydrochlorothiazide 25 Mg Tabs (Hydrochlorothiazide) .... Take 1 Tablet By Mouth Once A Day 4)  Meloxicam 15 Mg Tabs (Meloxicam) .... Take 1/2-1 Tablet Daily As Needed  Allergies (verified): No Known Drug  Allergies  Past History:  Past medical, surgical, family and social histories (including risk factors) reviewed, and no changes noted (except as noted below).  Past Medical History: Reviewed history from 03/03/2010 and no changes required. PULMONARY EMBOLISM, HX OF (ICD-V12.51)  Past Surgical History: Reviewed history from 03/03/2010 and no changes required. Tonsillectomy 2009 Fibroid removed 2005  Family History: Reviewed history from 03/03/2010 and no changes required. sister-cancer ?type father-deceased from heart attack  Social History: Reviewed history from 03/03/2010 and no changes required. Patient never smoked.  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  The patient denies productive cough, coughing up blood, irregular heartbeats, acid heartburn, indigestion, loss of appetite, weight change, abdominal pain, difficulty swallowing, sore throat, tooth/dental problems, headaches, sneezing, itching, ear ache, anxiety, depression, hand/feet swelling, joint stiffness or pain, rash, change in color of mucus, and fever.    Vital Signs:  Patient profile:   30 year old female Height:      64 inches Weight:      180.13 pounds BMI:     31.03 O2 Sat:      98 % on Room air  Temp:     98.2 degrees F oral Pulse rate:   84 / minute BP sitting:   108 / 84  (right arm) Cuff size:   regular  Vitals Entered By: Gweneth Dimitri RN (May 05, 2010 2:44 PM)  O2 Flow:  Room air  Serial Vital Signs/Assessments:  Comments: 3:31 PM Ambulatory Pulse Oximetry  Resting; HR_83____    02 Sat__98% RA___  Lap1 (185 feet)   HR__101___   02 Sat__99% RA___ Lap2 (185 feet)   HR__98___   02 Sat__98% RA___    Lap3 (185 feet)   HR_____   02 Sat_____  ___Test Completed without  Difficulty _x__Test Stopped due to: Pt requesting to rest d/t SOB and chest soreness. Gweneth Dimitri RN  May 05, 2010 3:31 PM  By: Gweneth Dimitri RN   CC: Acute Visit.  increased SOB "all the time,"  palpitations, achy pains in chest, dizziness at times with activity x 1 1/2 wks.  Feeling of throat closing x 1 night approx 1 1/2 wks ago.  Would like flu vac today. Comments Medications reviewed with patient Daytime contact number verified with patient. Gweneth Dimitri RN  May 05, 2010 2:43 PM    Physical Exam  Additional Exam:  Gen: Pleasant, well-nourished, in no distress , normal affect ENT: no lesions, no post nasal drip Neck: No JVD, no TMG, no carotid bruits Lungs: No use of accessory muscles, no dullness to percussion,  no overt wheeze, poor airflow Cardiovascular: RRR, heart sounds normal, no murmurs or gallops, no peripheral edema Abdomen: soft and non-tender, no HSM, BS normal Musculoskeletal: No deformities, no cyanosis or clubbing Neuro: alert, non-focal     Impression & Recommendations:  Problem # 1:  EXTRINSIC ASTHMA, UNSPECIFIED (ICD-493.00) Assessment Deteriorated Suspect ongoing airway obstruction with probable asthma as cause with airway obstruction on spirometry Doubt recurrent PE plan trial asmanex two puff daily as needed SABA pt instructed as to proper use of inhaler  Problem # 2:  PULMONARY EMBOLISM, HX OF (ICD-V12.51) Assessment: Improved Recent spells of chest pain and dyspnea likely due to asthma and note recent 03/03/10 CT angio neg for PE recurrence plan cont coumadin , 6 month therapy is plan  Her updated medication list for this problem includes:    Coumadin 5 Mg Tabs (Warfarin sodium) .Kelsey Friedman... Take as directed by anticoagulation clinic.  Medications Added to Medication List This Visit: 1)  Meloxicam 15 Mg Tabs (Meloxicam) .... Take 1/2-1 tablet daily as needed 2)  Asmanex 60 Metered Doses 220 Mcg/inh Aepb (Mometasone furoate) .... Two  puff daily 3)  Proair Hfa 108 (90 Base) Mcg/act Aers (Albuterol sulfate) .Kelsey Friedman.. 1-2 puffs every 4-6 hours as needed  Complete Medication List: 1)  Coumadin 5 Mg Tabs (Warfarin sodium) .... Take as directed by anticoagulation clinic. 2)  Pre-natal Formula Tabs (Prenatal multivit-min-fe-fa) .Kelsey Friedman.. 1 tablet by mouth daily 3)  Hydrochlorothiazide 25 Mg Tabs (Hydrochlorothiazide) .... Take 1 tablet by mouth once a day 4)  Meloxicam 15 Mg Tabs (Meloxicam) .... Take 1/2-1 tablet daily as needed 5)  Asmanex 60 Metered Doses 220 Mcg/inh Aepb (Mometasone furoate) .... Two puff daily 6)  Proair Hfa 108 (90 Base) Mcg/act Aers (Albuterol sulfate) .Kelsey Friedman.. 1-2 puffs every 4-6 hours as needed  Other Orders: Spirometry w/Graph (94010) Pulse Oximetry, Ambulatory (16109) Est. Patient Level IV (60454)  Patient Instructions: 1)  Start Asmanex two puff daily 2)  Use proair 1-2 puffs every 4hours as needed 3)  Stay on coumadin 4)  Return  two weeks for recheck Prescriptions: PROAIR HFA 108 (90 BASE) MCG/ACT  AERS (ALBUTEROL SULFATE) 1-2 puffs every 4-6 hours as needed  #1 x 6   Entered and Authorized by:   Storm Frisk MD   Signed by:   Storm Frisk MD on 05/05/2010   Method used:   Electron[facility] to        [facility] [address] [phone]* (retail)       [address]       [place], [place]  [address]       Ph: [phone] or [phone]       Fax: [phone]   RxID:   [id number] ASMANEX 60 METERED DOSES 220 MCG/INH AEPB (MOMETASONE FUROATE) Two puff daily  #1 x 6   Entered and Authorized by:   Storm Frisk MD   Signed by:   Storm Frisk MD on 05/05/2010   Method used:   [facility]        [facility] [address] [phone]* (retail)       [address]       [place], [place]  [address]       Ph: [phone] or [phone]       Fax: [phone]   RxID:   [id number]     Appended Document: Pulmonary OV fax ron polite

## 2010-09-29 NOTE — Progress Notes (Signed)
Summary: coumadin refill  Phone Note Call from Patient   Caller: Patient Call For: wright Summary of Call: pt needs rx for coumadin asap [facility] [address] pt # [phone] Initial call taken by: Tivis Ringer, CNA,  June 17, 2010 12:30 PM  Follow-up for Phone Call        refill was not received electronically, so I called in refill pt aware.Carron Curie CMA  June 17, 2010 12:45 PM

## 2010-09-29 NOTE — Medication Information (Signed)
Summary: rov/ewj  Anticoagulant Therapy  Managed by: Cloyde Reams, RN, BSN Referring MD: Ancil Boozer MD: Antoine Poche MD, Fayrene Fearing Indication 1: Pulmonary Embolism Lab Used: [facility] [place] Site: [address] INR POC 2.6 INR RANGE 2-3  Dietary changes: no    Health status changes: yes       Details: Pt has felt light-headed and dizzy as if she is going to faint (4 times / week).  She also says her veins feel cold sometimes.  Bleeding/hemorrhagic complications: no    Recent/future hospitalizations: no    Any changes in medication regimen? no    Recent/future dental: no  Any missed doses?: no       Is patient compliant with meds? yes       Allergies: No Known Drug Allergies  Anticoagulation Management History:      The patient comes in today for the following reason: e.  Negative risk factors for bleeding include an age less than 88 years old.  The bleeding index is 'low risk'.  Negative CHADS2 values include Age > 66 years old.  Anticoagulation responsible provider: Antoine Poche MD, Fayrene Fearing.  INR POC: 2.6.  Cuvette Lot#: [id number].  Exp: 05/2011.    Anticoagulation Management Assessment/Plan:      The patient's current anticoagulation dose is Coumadin 5 mg tabs: Take as directed by Anticoagulation Clinic..  The target INR is 2.0-3.0.  The next INR is due 05/18/2010.  Anticoagulation instructions were given to patient.  Results were reviewed/authorized by Cloyde Reams, RN, BSN.  She was notified by Gweneth Fritter, PharmD Candidate.         Prior Anticoagulation Instructions: INR 2.8  Continue on same dosage 1.5 tablets daily except 2 tablets on Fridays.  Recheck in 3 weeks.    Current Anticoagulation Instructions: INR 2.6  Continue taking 1.5 tablets (7.5mg ) every day except take 2 tablets (10mg ) on Fridays.  Recheck in 4 weeks.

## 2010-10-01 NOTE — Medication Information (Signed)
Summary: rov/tm  Anticoagulant Therapy  Managed by: Bethena Midget, RN, BSN Referring MD: Delford Field PCP: [name]  Supervising MD: Juanda Chance MD, Bruce Indication 1: Pulmonary Embolism Lab Used: [facility] [place] Site: [address] INR POC 3.3 INR RANGE 2-3  Dietary changes: no    Health status changes: no    Bleeding/hemorrhagic complications: no    Recent/future hospitalizations: no    Any changes in medication regimen? no    Recent/future dental: no  Any missed doses?: no       Is patient compliant with meds? yes      Comments: Pt states she is going out of town on 12/21 and will not be back til 08/31/09. She is aware of bleeding risk and to seek medical atten immediately if need be.  Allergies: No Known Drug Allergies  Anticoagulation Management History:      The patient is taking warfarin and comes in today for a routine follow up visit.  Negative risk factors for bleeding include an age less than 47 years old.  The bleeding index is 'low risk'.  Negative CHADS2 values include Age > 52 years old.  Anticoagulation responsible provider: Juanda Chance MD, Smitty Cords.  INR POC: 3.3.  Cuvette Lot#: [id number].  Exp: 08/2011.    Anticoagulation Management Assessment/Plan:      The patient's current anticoagulation dose is Coumadin 5 mg tabs: Take as directed by Anticoagulation Clinic..  The target INR is 2.0-3.0.  The next INR is due 09/01/2010.  Anticoagulation instructions were given to patient.  Results were reviewed/authorized by Bethena Midget, RN, BSN.  She was notified by Bethena Midget, RN, BSN.         Prior Anticoagulation Instructions: INR 2.3 Change dose to 2 pills everyday except 1.5 pills on Tuesdays. Recheck in 7-10 days.   Current Anticoagulation Instructions: INR 3.3 Skip today's dose then change dose to 2 pills everyday except 1.5 pills on Tuesdays and Saturdays.

## 2010-10-01 NOTE — Medication Information (Signed)
Summary: rov/tp  Anticoagulant Therapy  Managed by: Bethena Midget, RN, BSN Referring MD: Delford Field PCP: [name]  Supervising MD: Riley Kill MD, Maisie Fus Indication 1: Pulmonary Embolism Lab Used: [facility] [place] Site: [address] INR POC 1.9 INR RANGE 2-3  Dietary changes: no    Health status changes: no    Bleeding/hemorrhagic complications: no    Recent/future hospitalizations: no    Any changes in medication regimen? no    Recent/future dental: no  Any missed doses?: no       Is patient compliant with meds? yes       Allergies: No Known Drug Allergies  Anticoagulation Management History:      The patient is taking warfarin and comes in today for a routine follow up visit.  Negative risk factors for bleeding include an age less than 69 years old.  The bleeding index is 'low risk'.  Negative CHADS2 values include Age > 61 years old.  Anticoagulation responsible provider: Riley Kill MD, Maisie Fus.  INR POC: 1.9.  Cuvette Lot#: [id number].  Exp: 08/2011.    Anticoagulation Management Assessment/Plan:      The patient's current anticoagulation dose is Coumadin 5 mg tabs: Take as directed by Anticoagulation Clinic..  The target INR is 2.0-3.0.  The next INR is due 10/05/2010.  Anticoagulation instructions were given to patient.  Results were reviewed/authorized by Bethena Midget, RN, BSN.         Prior Anticoagulation Instructions: INR 3.3 Skip today's dose then change dose to 2 pills everyday except 1.5 pills on Tuesdays, Thursdays and Saturdays. Recheck in 2 weeks.   Current Anticoagulation Instructions: INR: 1.9  (goal 2-3)  Your INR is slightly low today.  Take 2 tablets tonight then resume your normal schedule of 2 tablets everyda except 1.5 tablets on Tue, Thur and Sat.  Return to clinic for another INR check in 3 weeks.

## 2010-10-01 NOTE — Progress Notes (Signed)
Summary: refill  Phone Note Call from Patient Call back at [phone]   Caller: Patient Call For: Dr. Delford Field Summary of Call: Pt phoned stated that she has called her pharmacy to request the refill for her [place] and pharmacy advised the patient to call our office told the paitent that the request had been sent to our office. Patient can be reached at [phone] Initial call taken by: Vedia Coffer,  August 18, 2010 4:50 PM  Follow-up for Phone Call        Rx refill sent to pharm.  Spoke with pt and notified this was done. Follow-up by: Vernie Murders,  August 18, 2010 5:13 PM

## 2010-10-01 NOTE — Medication Information (Signed)
Summary: rov/tm  Anticoagulant Therapy  Managed by: Bethena Midget, RN, BSN Referring MD: Delford Field PCP: [name]  Supervising MD: Jens Som MD, Arlys John Indication 1: Pulmonary Embolism Lab Used: [facility] [place] Site: [address] INR POC 3.3 INR RANGE 2-3  Dietary changes: no    Health status changes: no    Bleeding/hemorrhagic complications: no    Recent/future hospitalizations: no    Any changes in medication regimen? no    Recent/future dental: no  Any missed doses?: no       Is patient compliant with meds? yes       Allergies: No Known Drug Allergies  Anticoagulation Management History:      The patient is taking warfarin and comes in today for a routine follow up visit.  Negative risk factors for bleeding include an age less than 38 years old.  The bleeding index is 'low risk'.  Negative CHADS2 values include Age > 63 years old.  Anticoagulation responsible provider: Jens Som MD, Arlys John.  INR POC: 3.3.  Cuvette Lot#: [id number].  Exp: 10/2011.    Anticoagulation Management Assessment/Plan:      The patient's current anticoagulation dose is Coumadin 5 mg tabs: Take as directed by Anticoagulation Clinic..  The target INR is 2.0-3.0.  The next INR is due 09/15/2010.  Anticoagulation instructions were given to patient.  Results were reviewed/authorized by Bethena Midget, RN, BSN.  She was notified by Bethena Midget, RN, BSN.         Prior Anticoagulation Instructions: INR 3.3 Skip today's dose then change dose to 2 pills everyday except 1.5 pills on Tuesdays and Saturdays.   Current Anticoagulation Instructions: INR 3.3 Skip today's dose then change dose to 2 pills everyday except 1.5 pills on Tuesdays, Thursdays and Saturdays. Recheck in 2 weeks.

## 2010-10-06 ENCOUNTER — Encounter: Payer: Self-pay | Admitting: Cardiovascular Disease

## 2010-10-06 ENCOUNTER — Ambulatory Visit (INDEPENDENT_AMBULATORY_CARE_PROVIDER_SITE_OTHER): Payer: BC Managed Care – PPO | Admitting: Critical Care Medicine

## 2010-10-06 ENCOUNTER — Encounter: Payer: Self-pay | Admitting: Critical Care Medicine

## 2010-10-06 DIAGNOSIS — Z86718 Personal history of other venous thrombosis and embolism: Secondary | ICD-10-CM

## 2010-10-06 DIAGNOSIS — J45909 Unspecified asthma, uncomplicated: Secondary | ICD-10-CM

## 2010-10-15 NOTE — Assessment & Plan Note (Addendum)
Summary: Pulmonary OV   Primary Provider/Referring Provider:  [name]   CC:  3 month follow up.  Pt states breathing is doing well overall.  Occas tightness/pulling sensation in chest.  Would like to discuss stopping coumadin.Marland Kitchen  History of Present Illness: Pulmonary OV  03/03/10: Hx of Pulmonary embolii  RUL.  Pt was postpartum.  The pt had prolonged bedrest due to dilated cervix prepartum.  Delivery was as expected vaginally.  Pt was d/c home only to return one week later with acute dyspnea 6/11.  Pt found to have RUL PE>  No DVT seen.  Pt rx with lovenox and coumadin.  Pt breastfeeding and cleared by pharmacy for coumadin.  Now noting more dyspnea and chest pain  starting one week ago and worse last two days. Notes some cough,  is dry.  Pain is on the L side.  BP is high. Pt is on the coumadin.  Notes some itching.  INR has been therapeutic per coumadin clinic.  May 05, 2010 2:50 PM When sleeps feels like throat will close off.  COmes and goes.  Still with L sided chest pain and some palpitations.  Stays on the coumadin.   INR 2.6 on 04/20/10. Notes some cough, and is dry.  No real pn drip.  No real wheeze.   03/03/10 CT was neg for PE vs 02/17/10 CT.  Pt notes some wheezing and dyspnea.  No prior hx of asthma.  No real mucus.  Notes some sinus drainage and sinus obstruction.  Pt works as a Office manager.  May 19, 2010 4:22 PM The pt feels the same.  There is not much improvement in dyspnea. No real wheeze.  Feels sore if takes a breath in.    Uses proair about twice a day at work.  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  The cough was present and now is gone.  The dyspnea is not as bad as before but is still present.  June 22, 2010 4:23 PM The pt feels some better.  There is now not much cough.  No real chest pain, but not as sore as before  The pt is now on foradil and has helped,  also on asmanex daily The pt is off prednisone.  No other new issues.  October 06, 2010 3:10 PM doing  better,  still tightness, not everyday.  no real cough.  notes some wheezing.  notes some pulling sensation   Asthma History    Asthma Control Assessment:    Age range: 12+ years    Symptoms: 0-2 days/week    Nighttime Awakenings: 0-2/month    Interferes w/ normal activity: no limitations    SABA use (not for EIB): 0-2 days/week    ATAQ questionnaire: 0    FEV1 Pred: 2.718 liters (today)    Exacerbations requiring oral systemic steroids: 0-1/year    Asthma Control Assessment: Well Controlled   Current Medications (verified): 1)  Coumadin 5 Mg Tabs (Warfarin Sodium) .... Take As Directed By Anticoagulation Clinic. 2)  Pre-Natal Formula  Tabs (Prenatal Multivit-Min-Fe-Fa) .Marland Kitchen.. 1 Tablet By Mouth Daily 3)  Meloxicam 15 Mg Tabs (Meloxicam) .... Take 1/2-1 Tablet Daily As Needed 4)  Asmanex 60 Metered Doses 220 Mcg/inh Aepb (Mometasone Furoate) .... Two Puff Daily 5)  Proair Hfa 108 (90 Base) Mcg/act  Aers (Albuterol Sulfate) .Marland Kitchen.. 1-2 Puffs Every 4-6 Hours As Needed 6)  Foradil Aerolizer 12 Mcg Caps (Formoterol Fumarate) .... Inhale 1 Puff Two Times A Day  Allergies (verified):  No Known Drug Allergies  Past History:  Past medical, surgical, family and social histories (including risk factors) reviewed, and no changes noted (except as noted below).  Past Medical History: PULMONARY EMBOLISM, HX OF (ICD-V12.51) Asthma    -on LABA/ICS Dx 2011  Past Surgical History: Reviewed history from 03/03/2010 and no changes required. Tonsillectomy 2009 Fibroid removed 2005  Family History: Reviewed history from 03/03/2010 and no changes required. sister-cancer ?type father-deceased from heart attack  Social History: Reviewed history from 03/03/2010 and no changes required. Patient never smoked.  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  The patient denies shortness of breath at rest, productive  cough, non-productive cough, coughing up blood, chest pain, irregular heartbeats, acid heartburn, indigestion, loss of appetite, weight change, abdominal pain, difficulty swallowing, sore throat, tooth/dental problems, headaches, nasal congestion/difficulty breathing through nose, sneezing, itching, ear ache, anxiety, depression, hand/feet swelling, joint stiffness or pain, rash, change in color of mucus, and fever.    Vital Signs:  Patient profile:   30 year old female Height:      64 inches Weight:      182.38 pounds BMI:     31.42 O2 Sat:      99 % on Room air Temp:     98.1 degrees F oral Pulse rate:   100 / minute BP sitting:   100 / 70  (left arm) Cuff size:   large  Vitals Entered By: Gweneth Dimitri RN (October 06, 2010 3:08 PM)  O2 Flow:  Room air CC: 3 month follow up.  Pt states breathing is doing well overall.  Occas tightness/pulling sensation in chest.  Would like to discuss stopping coumadin. Comments Medications reviewed with patient Daytime contact number verified with patient. Gweneth Dimitri RN  October 06, 2010 3:08 PM    Physical Exam  Additional Exam:  Gen: Pleasant, well-nourished, in no distress , normal affect ENT: no lesions, no post nasal drip Neck: No JVD, no TMG, no carotid bruits Lungs: No use of accessory muscles, no dullness to percussion,  no overt wheeze, improved  airflow Cardiovascular: RRR, heart sounds normal, no murmurs or gallops, no peripheral edema Abdomen: soft and non-tender, no HSM, BS normal Musculoskeletal: No deformities, no cyanosis or clubbing Neuro: alert, non-focal     Pre-Spirometry FEV1    Pred: 2.718 L     Impression & Recommendations:  Problem # 1:  PULMONARY EMBOLISM, HX OF (ICD-V12.51) Assessment Improved Has improved with now 6months into rx ,  now ready to d/c coumadin plan d/c coumadin  The following medications were removed from the medication list:    Coumadin 5 Mg Tabs (Warfarin sodium) .Marland Kitchen... Take as  directed by anticoagulation clinic.  Orders: Est. Patient Level III ([address]) [address] [facility] Referral (Coumadin clinic)  Problem # 2:  EXTRINSIC ASTHMA, UNSPECIFIED (ICD-493.00) Assessment: Improved  moderate persistent asthma,  improved with foradil  plan stay on asmanex two puff daily cont foradil twice daily  Complete Medication List: 1)  Pre-natal Formula Tabs (Prenatal multivit-min-fe-fa) .Marland Kitchen.. 1 tablet by mouth daily 2)  Meloxicam 15 Mg Tabs (Meloxicam) .... Take 1/2-1 tablet daily as needed 3)  Asmanex 60 Metered Doses 220 Mcg/inh Aepb (Mometasone furoate) .... Two puff daily 4)  Proair Hfa 108 (90 Base) Mcg/act Aers (Albuterol sulfate) .Marland Kitchen.. 1-2 puffs every 4-6 hours as needed 5)  Foradil Aerolizer 12 Mcg Caps (Formoterol  fumarate) .... Inhale 1 puff two times a day  Patient Instructions: 1)  Stop coumadin 2)  Stay on Foradil and Asmanex 3)  Return 4 months  Prescriptions: FORADIL AEROLIZER 12 MCG CAPS (FORMOTEROL FUMARATE) Inhale 1 puff two times a day  #60 x 6   Entered and Authorized by:   Storm Frisk MD   Signed by:   Storm Frisk MD on 10/06/2010   Method used:   [facility] to        [facility] [address] [phone]* (retail)       [address]       [place], [place]  [address]       Ph: [phone] or [phone]       Fax: [phone]   RxID:   [id number] PROAIR HFA 108 (90 BASE) MCG/ACT  AERS (ALBUTEROL SULFATE) 1-2 puffs every 4-6 hours as needed  #1 x 6   Entered and Authorized by:   Storm Frisk MD   Signed by:   Storm Frisk MD on 10/06/2010   Method used:   [facility]        [facility] [address] [phone]* (retail)       [address]       [place], [place]  [address]       Ph: [phone] or [phone]       Fax: [phone]   RxID:   [id number] ASMANEX 60 METERED DOSES 220 MCG/INH AEPB (MOMETASONE FUROATE) Two puff daily  #1 x 6   Entered and Authorized by:    Storm Frisk MD   Signed by:   Storm Frisk MD on 10/06/2010   Method used:   [facility]        [facility] [address] [phone]* (retail)       [address]       [place], [place]  [address]       Ph: [phone] or [phone]       Fax: [phone]   RxID:   [id number]     Appended Document: Pulmonary OV fax ron polite

## 2010-10-15 NOTE — Medication Information (Signed)
Summary: Coumadin Clinic  Anticoagulant Therapy  Managed by: Inactive Referring MD: Delford Field PCP: [name]  Supervising MD: Eden Emms MD, Theron Arista Indication 1: Pulmonary Embolism Lab Used: [facility] [place] Site: [address] INR RANGE 2-3          Comments: Coumadin discontinued  per Dr Jonni Sanger.   Allergies: No Known Drug Allergies  Anticoagulation Management History:      Negative risk factors for bleeding include an age less than 16 years old.  The bleeding index is 'low risk'.  Negative CHADS2 values include Age > 71 years old.  Anticoagulation responsible provider: Eden Emms MD, Theron Arista.  Exp: 08/2011.    Anticoagulation Management Assessment/Plan:      The target INR is 2.0-3.0.  The next INR is due 10/05/2010.  Anticoagulation instructions were given to patient.  Results were reviewed/authorized by Inactive.         Prior Anticoagulation Instructions: INR: 1.9  (goal 2-3)  Your INR is slightly low today.  Take 2 tablets tonight then resume your normal schedule of 2 tablets everyda except 1.5 tablets on Tue, Thur and Sat.  Return to clinic for another INR check in 3 weeks.

## 2010-10-28 ENCOUNTER — Encounter: Payer: Self-pay | Admitting: Critical Care Medicine

## 2010-10-28 DIAGNOSIS — I2699 Other pulmonary embolism without acute cor pulmonale: Secondary | ICD-10-CM

## 2010-10-28 DIAGNOSIS — Z7901 Long term (current) use of anticoagulants: Secondary | ICD-10-CM | POA: Insufficient documentation

## 2010-11-15 LAB — HOMOCYSTEINE: Homocysteine: 8.4 umol/L (ref 4.0–15.4)

## 2010-11-15 LAB — CBC
HCT: 40.1 % (ref 36.0–46.0)
MCHC: 33.5 g/dL (ref 30.0–36.0)
MCV: 91.3 fL (ref 78.0–100.0)
RBC: 4.39 MIL/uL (ref 3.87–5.11)
RDW: 13.5 % (ref 11.5–15.5)
WBC: 7.7 10*3/uL (ref 4.0–10.5)

## 2010-11-15 LAB — PROTHROMBIN GENE MUTATION

## 2010-11-15 LAB — COMPREHENSIVE METABOLIC PANEL
ALT: 27 U/L (ref 0–35)
Albumin: 3.2 g/dL — ABNORMAL LOW (ref 3.5–5.2)
Creatinine, Ser: 0.77 mg/dL (ref 0.4–1.2)
Total Bilirubin: 0.9 mg/dL (ref 0.3–1.2)

## 2010-11-15 LAB — LUPUS ANTICOAGULANT PANEL
DRVVT: 44 secs (ref 36.2–44.3)
PTT Lupus Anticoagulant: 39.3 secs (ref 30.0–45.6)

## 2010-11-15 LAB — PROTEIN S ACTIVITY: Protein S Activity: 78 % (ref 69–129)

## 2010-11-15 LAB — BETA-2-GLYCOPROTEIN I ABS, IGG/M/A
Beta-2-Glycoprotein I IgA: 3 A Units (ref <20)
Beta-2-Glycoprotein I IgM: 0 M Units (ref <20)

## 2010-11-15 LAB — PROTEIN C ACTIVITY: Protein C Activity: 121 % (ref 75–133)

## 2010-11-15 LAB — FACTOR 5 LEIDEN

## 2010-11-15 LAB — PROTEIN S, TOTAL: Protein S Ag, Total: 95 % (ref 70–140)

## 2010-11-15 LAB — PROTEIN C, TOTAL: Protein C, Total: 78 % (ref 70–140)

## 2010-11-16 LAB — URINE CULTURE: Special Requests: NEGATIVE

## 2010-11-16 LAB — COMPREHENSIVE METABOLIC PANEL
Alkaline Phosphatase: 171 U/L — ABNORMAL HIGH (ref 39–117)
BUN: 3 mg/dL — ABNORMAL LOW (ref 6–23)
CO2: 19 mEq/L (ref 19–32)
Calcium: 8.9 mg/dL (ref 8.4–10.5)
GFR calc non Af Amer: >60 mL/min (ref >60)
Glucose, Bld: 93 mg/dL (ref 70–99)
Total Protein: 6.2 g/dL (ref 6.0–8.3)

## 2010-11-16 LAB — CBC
HCT: 35.2 % — ABNORMAL LOW (ref 36.0–46.0)
HCT: 40.6 % (ref 36.0–46.0)
Hemoglobin: 12.3 g/dL (ref 12.0–15.0)
Hemoglobin: 14.2 g/dL (ref 12.0–15.0)
MCHC: 34.9 g/dL (ref 30.0–36.0)
MCV: 90.1 fL (ref 78.0–100.0)
Platelets: 211 10*3/uL (ref 150–400)
RDW: 14.7 % (ref 11.5–15.5)
WBC: 22 10*3/uL — ABNORMAL HIGH (ref 4.0–10.5)

## 2010-11-16 LAB — RPR: RPR Ser Ql: NONREACTIVE

## 2010-11-16 LAB — LACTATE DEHYDROGENASE: LDH: 180 U/L (ref 94–250)

## 2010-12-01 LAB — CBC
HCT: 42 % (ref 36.0–46.0)
Hemoglobin: 14.2 g/dL (ref 12.0–15.0)
MCHC: 33.9 g/dL (ref 30.0–36.0)
Platelets: 312 10*3/uL (ref 150–400)
RDW: 13 % (ref 11.5–15.5)

## 2010-12-03 ENCOUNTER — Emergency Department (INDEPENDENT_AMBULATORY_CARE_PROVIDER_SITE_OTHER): Payer: BC Managed Care – PPO

## 2010-12-03 ENCOUNTER — Inpatient Hospital Stay (HOSPITAL_COMMUNITY): Payer: BC Managed Care – PPO

## 2010-12-03 ENCOUNTER — Ambulatory Visit (HOSPITAL_COMMUNITY)
Admission: RE | Admit: 2010-12-03 | Discharge: 2010-12-04 | Disposition: A | Discharge status: Home / Self Care | Payer: BC Managed Care – PPO | Source: Ambulatory Visit | Attending: General Surgery | Admitting: General Surgery

## 2010-12-03 ENCOUNTER — Emergency Department (HOSPITAL_BASED_OUTPATIENT_CLINIC_OR_DEPARTMENT_OTHER)
Admission: EM | Admit: 2010-12-03 | Discharge: 2010-12-03 | Disposition: A | Discharge status: Nursing Home (Includes ICF) | Payer: BC Managed Care – PPO | Source: Home / Self Care | Attending: Emergency Medicine | Admitting: Emergency Medicine

## 2010-12-03 ENCOUNTER — Other Ambulatory Visit: Payer: Self-pay | Admitting: General Surgery

## 2010-12-03 DIAGNOSIS — R1031 Right lower quadrant pain: Secondary | ICD-10-CM | POA: Insufficient documentation

## 2010-12-03 DIAGNOSIS — R1013 Epigastric pain: Secondary | ICD-10-CM | POA: Insufficient documentation

## 2010-12-03 DIAGNOSIS — K8000 Calculus of gallbladder with acute cholecystitis without obstruction: Principal | ICD-10-CM | POA: Insufficient documentation

## 2010-12-03 DIAGNOSIS — R112 Nausea with vomiting, unspecified: Secondary | ICD-10-CM | POA: Insufficient documentation

## 2010-12-03 DIAGNOSIS — Z79899 Other long term (current) drug therapy: Secondary | ICD-10-CM | POA: Insufficient documentation

## 2010-12-03 DIAGNOSIS — K810 Acute cholecystitis: Secondary | ICD-10-CM

## 2010-12-03 DIAGNOSIS — J45909 Unspecified asthma, uncomplicated: Secondary | ICD-10-CM | POA: Insufficient documentation

## 2010-12-03 DIAGNOSIS — R1011 Right upper quadrant pain: Secondary | ICD-10-CM | POA: Insufficient documentation

## 2010-12-03 DIAGNOSIS — Z86718 Personal history of other venous thrombosis and embolism: Secondary | ICD-10-CM | POA: Insufficient documentation

## 2010-12-03 DIAGNOSIS — R110 Nausea: Secondary | ICD-10-CM | POA: Insufficient documentation

## 2010-12-03 LAB — COMPREHENSIVE METABOLIC PANEL WITH GFR
ALT: 25 U/L (ref 0–35)
AST: 20 U/L (ref 0–37)
Albumin: 4.5 g/dL (ref 3.5–5.2)
Alkaline Phosphatase: 102 U/L (ref 39–117)
BUN: 7 mg/dL (ref 6–23)
CO2: 23 meq/L (ref 19–32)
Calcium: 9.1 mg/dL (ref 8.4–10.5)
Chloride: 109 meq/L (ref 96–112)
Creatinine, Ser: 0.7 mg/dL (ref 0.4–1.2)
GFR calc Af Amer: >60 mL/min (ref >60)
GFR calc non Af Amer: >60 mL/min (ref >60)
Glucose, Bld: 83 mg/dL (ref 70–99)
Potassium: 3.7 meq/L (ref 3.5–5.1)
Sodium: 147 meq/L — ABNORMAL HIGH (ref 135–145)
Total Bilirubin: 2 mg/dL — ABNORMAL HIGH (ref 0.3–1.2)
Total Protein: 8.2 g/dL (ref 6.0–8.3)

## 2010-12-03 LAB — DIFFERENTIAL
Basophils Relative: 0 % (ref 0–1)
Eosinophils Absolute: 0.1 10*3/uL (ref 0.0–0.7)
Eosinophils Relative: 1 % (ref 0–5)
Lymphs Abs: 2.1 10*3/uL (ref 0.7–4.0)
Monocytes Absolute: 0.6 10*3/uL (ref 0.1–1.0)
Monocytes Relative: 7 % (ref 3–12)
Neutrophils Relative %: 70 % (ref 43–77)

## 2010-12-03 LAB — CBC
MCH: 28.7 pg (ref 26.0–34.0)
MCHC: 34.5 g/dL (ref 30.0–36.0)
MCV: 83 fL (ref 78.0–100.0)
Platelets: 364 10*3/uL (ref 150–400)
RBC: 5.06 MIL/uL (ref 3.87–5.11)

## 2010-12-03 LAB — PROTIME-INR
INR: 1.02 (ref 0.00–1.49)
Prothrombin Time: 13.6 seconds (ref 11.6–15.2)

## 2010-12-03 LAB — URINALYSIS, ROUTINE W REFLEX MICROSCOPIC
Bilirubin Urine: NEGATIVE
Hgb urine dipstick: NEGATIVE
Ketones, ur: NEGATIVE mg/dL
Nitrite: NEGATIVE
Protein, ur: NEGATIVE mg/dL
Specific Gravity, Urine: 1.017 (ref 1.005–1.030)
Urobilinogen, UA: 0.2 mg/dL (ref 0.0–1.0)

## 2010-12-03 LAB — SURGICAL PCR SCREEN
MRSA, PCR: NEGATIVE
Staphylococcus aureus: NEGATIVE

## 2010-12-03 LAB — LIPASE, BLOOD: Lipase: 57 U/L (ref 23–300)

## 2010-12-14 NOTE — H&P (Signed)
NAME:  Kelsey Friedman, Kelsey Friedman NO.:  [id number]  MEDICAL RECORD NO.:  [id number]           PATIENT TYPE:  O  LOCATION:  1528                         FACILITY:  [facility]  PHYSICIAN:  Anselm Pancoast. Destenie Ingber, M.D.DATE OF BIRTH:  February 07, 1981  DATE OF ADMISSION:  12/03/2010 DATE OF DISCHARGE:                             HISTORY & PHYSICAL   CHIEF COMPLAINT:  Abdominal pain.  HISTORY:  Kelsey Friedman is a 30 year old female mother of one 10 months ago who this morning awoke at about 4:00 a.m. with severe epigastric pain and with the pain not subsiding went to the Urgent Care over on [address]. She had laboratory studies then later this morning had an ultrasound of the gallbladder that showed a stone kind of impacted in the neck of the gallbladder with some edematous changes.  The patient has a history of PE approximately 10 months ago, but was taken off of her Coumadin as no further problems in February.  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  The patient was seen.  The pain appeared to be recurrent and they called [place] at about 12:15 about could they send her on in for consideration and surgical management at this time.  I was in agreement and it was 4 hours getting her here, even though it was only from 68, but she finally arrived and we put her in kind of a short stay. We converted her over to our computer system, Melvern Banker and then on physical examination she is mildly tender in the right upper quadrant, but certainly not an acute surgical abdomen.  Her laboratory studies, serum electrolytes were normal.  White count was 9500, hematocrit was 42.  They had done a prothrombin time and a urinalysis that was unremarkable.  I recommended that we go ahead and proceed with a laparoscopy of the gallbladder with a cholangiogram and the patient was in agreement.  PAST MEDICAL HISTORY:  The PE, which was documented with a chest angiogram or actually a chest CT and then  she has had another little episode of pain 4 or 5 months ago that showed no evidence of any clots in the lungs at that time.  MEDICATIONS:  She is on an albuterol inhaler and prenatal vitamins.  PHYSICAL EXAMINATION:  GENERAL:  She is a pleasant, slightly overweight female in no acute distress. VITAL SIGNS:  In the emergency room at 68.  She is not febrile.  Pulse was 70 to 85.  Blood pressure was 128/87, respirations 16.  EYES, EARS, NOSE AND THROAT:  Unremarkable. LUNGS:  Clear. CARDIOVASCULAR:  Normal sinus rhythm. ABDOMEN:  Mildly tender in the right upper quadrant, not tender in the lower abdomen. PELVIC/RECTAL:  No pelvic or rectal examination was performed. EXTREMITIES:  Unremarkable.  ADMISSION IMPRESSION:  Acute cholecystitis, early.  PLAN:  Plan on a laparoscopy of the gallbladder with cholangiogram this evening.  She will probably spend the night, but we will let that decision be postoperatively.  She is not allergic to penicillin.     Anselm Pancoast. Zachery Dakins, M.D.     WJW/MEDQ  D:  12/03/2010  T:  12/04/2010  Job:  [id number]  Electronically Signed by Consuello Bossier M.D. on 12/14/2010 08:54:57 AM

## 2010-12-14 NOTE — Op Note (Signed)
NAME:  [name], [name] NO.:  [id number]  MEDICAL RECORD NO.:  [id number]           PATIENT TYPE:  O  LOCATION:  1528                         FACILITY:  [facility]  PHYSICIAN:  Anselm Pancoast. Govanni Plemons, M.D.DATE OF BIRTH:  1981/01/05  DATE OF PROCEDURE:  12/03/2010 DATE OF DISCHARGE:                              OPERATIVE REPORT   PREOPERATIVE DIAGNOSIS:  Acute cholecystitis with stones.  POSTOPERATIVE DIAGNOSIS:  Acute cholecystitis with stones.  OPERATION:  Laparoscopic cholecystectomy with cholangiogram.  SURGEON:  Anselm Pancoast. Zachery Dakins, M.D.  ASSISTANT:  Nurse.  HISTORY:  Kelsey Friedman is a 30 year old female, mother of one 20-month- old who had an acute onset of abdominal pain this morning at approximately 4:00 a.m.  The pain persisted.  She went to the emergency room over on [address] and the patient's laboratory studies were basically unremarkable.  She has a past history of a pulmonary embolus and her pain was all in the right upper quadrant and not the chest.  They got an ultrasound of the gallbladder that showed a stone with an edematous gallbladder and we were called at about 12:15.  Her pain was reoccurring and nausea when they called and she desired to be transferred over so that we could go ahead and proceed with her cholecystectomy today.  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  She is not allergic to penicillin.  PROCEDURE IN DETAIL:  She was given 3 grams of Unasyn and PAS stockings. The patient was taken to the operative suite.  Induction of general anesthesia by Dr. Council Mechanic and the abdomen was prepped with Betadine surgical scrub solution and draped in the sterile manner.  A small incision was made below the umbilicus.  She weighs about 180 pounds. The fascia was identified, picked up between two Kochers and a small opening carefully made into the peritoneal cavity.  A pursestring suture of 0 Vicryl was placed  and a Hasson cannula was introduced.  The gallbladder was quite edematous, but not tense and the upper 10-mm trocar was placed in the subxiphoid after anesthetizing the fascia and two lateral 5-mm trocars.  The gallbladder retracted upward and outward. The peritoneum over the proximal portion of the gallbladder was opened. The cystic duct was identified and a clip was placed at the junction of the cystic duct and gallbladder.  I placed a cholangiocath through a little stab wound and on trying to open the cystic duct, the scissors were dull and I could not get any cut in and then we switched to a new pair and a little opening was made.  Threading the catheter initially was difficult because it had kind of been chewed where the little opening had been made, but finally I was able to kind of open it up and get a catheter in place, held in place with a clip.  The cholangiogram showed a very small biliary system, good flow into the duodenum and you could see the bifurcation in the liver.  The catheter was removed. Three clips were placed across the cystic duct and the duct was then divided.  The  cystic artery was identified proximally and clipped x2, singly, distally and divided and then the gallbladder was freed from its bed predominantly with the hook electrocautery.  Good hemostasis was obtained and the gallbladder was placed in an EndoCatch bag.  The camera was switched to the upper 10-mm trocar after the fluid had been aspirated and then the bag containing the gallbladder withdrawn.  There was kind of a large stone within the gallbladder, but it came out without enlarging the fascia and then the pursestring suture was tied. I did place an additional stitch in the umbilicus and then anesthetized the fascia of the umbilicus.  The subcutaneous wounds after the ports had been removed, there was no evidence of any bleeding.  The little site where the cholangiocath had been inserted was slightly  bleeding and I touched it with cautery.  I put a single stitch in the fascia in the upper 10-mm trocar.  The subcutaneous wounds were closed with 4-0 Vicryl.  Benzoin and Steri-Strips on the skin.  The patient tolerated the procedure nicely.  The estimated blood loss was minimal.  Sponge and needle counts were correct x2.  I think that the patient is planning on spending the night and should be able to be discharged tomorrow. Hopefully we will have no problems.  I do not think that she needs any additional antibiotics.     Anselm Pancoast. Zachery Dakins, M.D.     WJW/MEDQ  D:  12/03/2010  T:  12/04/2010  Job:  [id number]  Electronically Signed by Consuello Bossier M.D. on 12/14/2010 08:55:06 AM

## 2010-12-14 NOTE — H&P (Signed)
NAME:  Kelsey Friedman, Kelsey Friedman NO.:  [id number]  MEDICAL RECORD NO.:  [id number]           PATIENT TYPE:  O  LOCATION:  1528                         FACILITY:  [facility]  PHYSICIAN:  Anselm Pancoast. Tonie Elsey, M.D.DATE OF BIRTH:  [birth year]  DATE OF ADMISSION:  12/03/2010 DATE OF DISCHARGE:                             HISTORY & PHYSICAL   CHIEF COMPLAINT:  Abdominal pain.  BRIEF HISTORY:  The patient is a 30 year old African-American female who has had 3 to 4 days of pain, it kind of waxes and wanes.  It was not related to food or activity.  This morning, she woke up around 4:30 a.m., had a bad taste in her mouth, had pain in her right upper quadrant and nausea and vomiting.  Despite this, she went on to work but continued to have discomfort and saw the school nurse who sent her to the ER.  Labs at the [facility] ER facility showed  protime of 13.6 and INR 1.02, a lipase 57.  Sodium 147, potassium is 3.7, chloride is 109, CO2 is 23, BUN is 7, creatinine 0.7, glucose 83, total bilirubin is 2. Alk phos is 102, SGPT is 20, SGOT is 25.  White count is 9.5, hemoglobin is 14.5, hematocrit is 42, platelets are 164,000.  Abdominal ultrasound shows gallbladder was contracted with shadowing stones, increased echogenicity, gallbladder wall thickened 6 mm, positive Murphy's sign, bile duct was 3 mm.  PAST MEDICAL HISTORY: 1. History of right PE, June 2011. 2. History of asthma, the last time she was given inhaler was 2 weeks     ago.  SURGICAL HISTORY:  She had one hysteroscopic myomectomy in 2006.  She has had 1 child by vaginal birth.  FAMILY HISTORY:  Mother is living with high blood pressure.  Father died at 19 with acute MI.  She has 4 brothers and 2 sisters, all are in good health except for one sister who has had cancer and is currently doing well.  SOCIAL HISTORY:  Tobacco, none.  Alcohol, none.  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  REVIEW OF SYSTEMS:  Fever:  None.  Skin:   No changes.  PSYCH:  No changes.  Weight:  No changes.  CEREBROVASCULAR:  No headaches, dizziness, syncope, presyncope, stroke or seizure.  PULMONARY:  No orthopnea, PND, DOE.  GI:  Negative for GERD, nausea, vomiting, diarrhea, constipation or blood prior to today.  GU:  Negative. CARDIAC:  No history of chest pain or palpitations, cardiac workup. LOWER EXTREMITIES: Without edema or claudication.  MUSCULOSKELETAL:  No joint changes.  ENDOCRINE:  No changes.  CURRENT MEDICATIONS:  She has albuterol inhaler she uses p.r.n.  ALLERGIES:  None.  PHYSICAL EXAMINATION:  GENERAL:  This is a well-nourished, well- developed African-American female, in no acute distress. VITAL SIGNS:  Height is 64 inches, weight is 176 pounds.  Temperature is 96.9, heart rate is 70, blood pressure is 124/87, respiratory rate is 18, sats 98% on room air. HEENT:  Head:  Normocephalic.  Eyes, ears, nose and throat are all within normal limits. NECK:  No bruits, no JVD, no thyromegaly. CHEST:  Clear to  auscultation percussion.  Normal respiratory effort. No tenderness on palpation. CARDIAC:  Normal S1 and S2. ABDOMEN:  Soft, nontender.  Positive bowel sounds.  No palpable splenomegaly.  She has had pain medicines and right now it does not hurt to press on her.  No abscesses, hernias, or masses noted. GU:  Deferred. LYMPHADENOPATHY:  None palpated. MUSCULOSKELETAL:  No changes. SKIN:  No changes. NEUROLOGIC: No changes. PSYCH:  No changes.  IMPRESSION: 1. Cholelithiasis/cholecystitis with elevated liver function tests and     pain. 2. Asthma. 3. History of pulmonary embolism.  PLAN:  She is going to the OR as soon as we can get her there.     Eber Hong, P.A.   ______________________________ Anselm Pancoast. Zachery Dakins, M.D.    WDJ/MEDQ  D:  12/03/2010  T:  12/04/2010  Job:  [id number]  Electronically Signed by Sherrie George P.A. on 12/09/2010 06:00:21 PM Electronically Signed by Consuello Bossier M.D. on 12/14/2010 08:54:46 AM

## 2010-12-21 ENCOUNTER — Ambulatory Visit
Admission: RE | Admit: 2010-12-21 | Discharge: 2010-12-21 | Disposition: A | Discharge status: Home / Self Care | Payer: BC Managed Care – PPO | Source: Ambulatory Visit | Attending: Internal Medicine | Admitting: Internal Medicine

## 2010-12-21 ENCOUNTER — Other Ambulatory Visit: Payer: Self-pay | Admitting: Internal Medicine

## 2010-12-21 DIAGNOSIS — R0602 Shortness of breath: Secondary | ICD-10-CM

## 2010-12-21 DIAGNOSIS — G809 Cerebral palsy, unspecified: Secondary | ICD-10-CM

## 2010-12-21 MED ORDER — IOHEXOL 300 MG/ML  SOLN
125.0000 mL | Freq: Once | INTRAMUSCULAR | Status: AC | PRN
  Administered 2010-12-21: 125 mL via INTRAVENOUS

## 2011-01-15 NOTE — Discharge Summary (Signed)
NAME:  Kelsey Friedman, SEELIG NO.:  [id number]   MEDICAL RECORD NO.:  [id number]          PATIENT TYPE:  INP   LOCATION:  9317                          FACILITY:  [facility]   PHYSICIAN:  Juan H. Lily Peer, M.D.DATE OF BIRTH:  12-Nov-1980   DATE OF ADMISSION:  01/25/2006  DATE OF DISCHARGE:  01/27/2006                                 DISCHARGE SUMMARY   HISTORY:  A 30 year old gravida 1 at 22-1/[redacted] weeks gestation was seen in the  office for anatomical screen was found to have an hydramnios and on further  questioning the patient may have been leaking fluid for quite some time for  a few days and had a viral syndrome.  She was admitted with the cervix 2 cm  dilated, 90% effaced, minus one station.  GC, chlamydia culture and GBS  culture pending at time of this dictation.   The patient was admitted and induced.  The patient was induced.  She had  been placed on Unasyn 3 grams IV q.6 h for prophylaxis.  The patient  delivered a female infant with Apgars of one and one.  The  baby  subsequently succumbed.  The the fetus was shown to the parents and passed  off.  They have requested for burial services and will plan on doing so  after the death certificate is signed.  The patient then was switched from  Unasyn to Augmentin 875 mg p.o. b.i.d. due to the fact she has mild  leukocytosis.  Her hemoglobin and hematocrit postop day #1 was 11.9 and 34.6  respectively.  White blood count 19,000, platelet count 371,000.  She was  afebrile.  Her lochia decreased and she was ready to be discharged home.   ASSESSMENT:  A 30 year old gravida 1, para 1, preterm premature rupture of  membranes.  Was induced and delivered a nonviable fetus.  The patient been  placed on prophylactic antibiotic and will be discharged home on Augmentin  875 mg p.o. b.i.d. for 5 days.  She was given a prescription of Lortab to  take one tablet q.4-6 h p.r.n. pain (7.5/500).  She is to follow up in 2  weeks for postop  visit.  She is instructed continue her prenatal vitamins as  well.  Her blood type was O+.  RPR was negative.  GC and chlamydia culture  GBS culture pending at time of dictation.      Juan H. Lily Peer, M.D.  Electronically Signed     JHF/MEDQ  D:  01/27/2006  T:  01/27/2006  Job:  [id number]

## 2011-01-15 NOTE — H&P (Signed)
NAME:  Kelsey Friedman, Kelsey Friedman NO.:  [id number]   MEDICAL RECORD NO.:  [id number]          PATIENT TYPE:  AMB   LOCATION:  [facility]                         FACILITY:  [facility]   PHYSICIAN:  Timothy P. Fontaine, M.D.DATE OF BIRTH:  1981/03/22   DATE OF ADMISSION:  DATE OF DISCHARGE:                                HISTORY & PHYSICAL   The patient being admitted to [facility] for surgery December 07, 2004 at 7:30 a.m.   CHIEF COMPLAINT:  Irregular vaginal bleeding.   HISTORY OF PRESENT ILLNESS:  A 30 year old G0 on oral contraceptives who  presents complaining of irregular vaginal bleeding. The patient underwent  initial ultrasound evaluation with suspicion of an intracavitary  abnormality. She underwent a subsequent sonohysterogram which showed a 14 x  9 x 7 mm intracavitary mass, questionable myoma versus large polyp. The  remainder of the ultrasound was normal and she is admitted at this time for  a hysteroscopic evaluation and resection.   PAST MEDICAL HISTORY:  Uncomplicated.   PAST SURGICAL HISTORY:  None.   ALLERGIES:  No known medicines.   CURRENT MEDICATIONS:  Oral contraceptives.   REVIEW OF SYSTEMS:  Noncontributory.   ADMISSION PHYSICAL EXAMINATION:  HEENT:  Normal.  LUNGS:  Clear.  CARDIAC:  Regular rate; no rubs, murmurs, or gallops.  ABDOMEN:  Benign.  PELVIC:  External, BUS, vagina normal. Cervix normal. Uterus normal size,  midline, mobile, nontender. Adnexa without masses or tenderness.   ASSESSMENT AND PLAN:  A 30 year old gravida 0 with irregular bleeding,  ultrasound suggestive of a submucous myoma versus polyp. The situation was  reviewed with the patient and her husband and the plan is for a  hysteroscopic evaluation and resection. I reviewed what is involved with the  procedure to include the expected intraoperative/postoperative courses. I  discussed the risks to include bleeding; transfusion; infection; uterine  perforation;  damage to internal organs including bowel, bladder, ureters,  vessels and nerves necessitating major  exploratory reparative surgeries and future reparative surgeries including  ostomy formation. I also reviewed the issues of distention media absorption  and the potential metabolic complications associated with this. The  patient's questions were answered to her satisfaction and she is ready to  proceed with the surgery.      TPF/MEDQ  D:  12/04/2004  T:  12/04/2004  Job:  [id number]

## 2011-01-15 NOTE — Op Note (Signed)
NAME:  Kelsey Friedman, Kelsey Friedman NO.:  [id number]   MEDICAL RECORD NO.:  [id number]          PATIENT TYPE:  AMB   LOCATION:  [facility]                         FACILITY:  [facility]   PHYSICIAN:  Timothy P. Fontaine, M.D.DATE OF BIRTH:  09-04-1980   DATE OF PROCEDURE:  12/07/2004  DATE OF DISCHARGE:                                 OPERATIVE REPORT   PREOPERATIVE DIAGNOSES:  SubMucous myoma.   POSTOPERATIVE DIAGNOSES:  SubMucous myoma.   PROCEDURE:  Hysteroscopic myomectomy D&C.   SURGEON:  Timothy P. Fontaine, M.D.   ANESTHESIA:  General.   ESTIMATED BLOOD LOSS:  Minimal.   COMPLICATIONS:  None.   SORBITOL DISCREPANCY:  40 mL.   SPECIMENS:  1.  Endometrial curetting.  2.  Myoma fragments.   FINDINGS:  EUA uterus normal size, midline and mobile. Adnexa without  masses. Hysteroscopic single submucous myoma filling cavity resected in its  completeness. Cavity otherwise noted to be normal noting tubal ostia not  visualized.   DESCRIPTION OF PROCEDURE:  The patient was taken to the operating room,  underwent general anesthesia and was placed in the low dorsal lithotomy  position and received a perineal vaginal preparation with Betadine solution.  The bladder emptied with in and out Foley catheterization per nursing  personnel and the patient was draped in the usual fashion. An EUA was  performed. The cervix was visualized with a speculum, anterior lip grasped  with a single tooth tenaculum and the cervix was gently and gradually  dilated to admit the operative hysteroscope. Hysteroscopy was performed with  findings of a single myoma filling the cavity. This myoma was resected using  the right angle loop resectoscope in sequential passes in its entirety.  Subsequent hysteroscopy showed an empty normal appearing cavity although the  tubal ostia were not visualized. A sharp curettage was then performed.  Rehysteroscopy showed an empty cavity, good distention, no evidence of  perforation. The instruments were removed, adequate hemostasis visualized,  patient placed in supine position, awakened without difficulty having  tolerated the procedure well.      TPF/MEDQ  D:  12/07/2004  T:  12/07/2004  Job:  [id number]

## 2011-01-15 NOTE — H&P (Signed)
NAME:  Kelsey Friedman, Kelsey Friedman NO.:  [id number]   MEDICAL RECORD NO.:  [id number]          PATIENT TYPE:  INP   LOCATION:  9169                          FACILITY:  [facility]   PHYSICIAN:  Ivor Costa. Farrel Gobble, M.D. DATE OF BIRTH:  1980-11-05   DATE OF ADMISSION:  01/25/2006  DATE OF DISCHARGE:                                HISTORY & PHYSICAL   CHIEF COMPLAINT:  PPROM with labor at 22 weeks.   HISTORY OF PRESENT ILLNESS:  The patient is a 30 year old G1 with an LMP of  August 22, 2005, estimated date of confinement was May 29, 2006,  estimated gestational age is 65 and 1/7 weeks, who presented to office for a  followup anatomy screen.  Her previous anatomy screen done at 53 and 3 was  incomplete on the cardiacum profile.  The patient, when she presented,  reported some abdominal pain.  Her ultrasound was remarkable for  anhydramnios.  The patient was then transferred to the examining room.  On  further discussion with the patient, she had what sounds like a viral  illness with nausea, vomiting, that began on the 18th for a day or two, then  sometime around that time had a low-grade fever as well.  She has presented  now with some diarrhea.  The patient states she noticed an increase in  discharge, mostly noted with coughing and/or vomiting.  The patient states  that she began having waves of pain last evening which became more intense  upon presentation today.  Her pregnancy was essentially uncomplicated.  She  had first trimester screening which was normal, including a normal second  trimester AFP.   PAST OBSTETRIC/GYNECOLOGIC HISTORY:  Significant for a hysteroscopic  myomectomy in 2006.  Otherwise, her cycles were regular.   PAST MEDICAL HISTORY:  Negative.   PAST SURGICAL HISTORY:  As mentioned above.   She is O positive, antibody negative, RPR nonreactive, rubella immune,  hepatitis B surface antigen nonreactive, HIV nonreactive, refer to the  [place].   PHYSICAL EXAMINATION:  VITAL SIGNS:  She is afebrile.  Her vitals were  stable.  HEART:  Regular rate.  LUNGS:  Clear to auscultation.  ABDOMEN:  Gravid, soft, nontender, without rebound.  SPECULUM EXAM:  There is no discharge in the vault.  The cervix could not be  visualized in its entirety.  Gonorrhea and chlamydia cultures were done as  well as GBS via sterile speculum.  A sterile vaginal exam showed the cervix  to be 2 cm, 90%, -1 in posterior.   ASSESSMENT:  Preterm premature rupture of fetal membranes at 22 weeks with  signs of labor with cervical dilation.  The findings were discussed with the  patient and her husband who are both appropriately upset.  Delivery  at this point would be inevitable as the cervix is dilated and effaced.  We  will, therefore, send them over to labor and delivery for IV antibiotics and  induction of labor.  The nonviable nature of this pregnancy was also  discussed with them and they are not interested in resuscitative efforts.  Ivor Costa. Farrel Gobble, M.D.  Electronically Signed     THL/MEDQ  D:  01/25/2006  T:  01/25/2006  Job:  [id number]

## 2011-04-27 ENCOUNTER — Other Ambulatory Visit: Payer: Self-pay | Admitting: Internal Medicine

## 2011-04-27 ENCOUNTER — Ambulatory Visit
Admission: RE | Admit: 2011-04-27 | Discharge: 2011-04-27 | Disposition: A | Discharge status: Home / Self Care | Payer: BC Managed Care – PPO | Source: Ambulatory Visit | Attending: Internal Medicine | Admitting: Internal Medicine

## 2011-04-27 DIAGNOSIS — R52 Pain, unspecified: Secondary | ICD-10-CM

## 2011-05-26 LAB — POCT I-STAT, CHEM 8
BUN: 8
Calcium, Ion: 1.15
Chloride: 107
Creatinine, Ser: 0.8
Glucose, Bld: 108 — ABNORMAL HIGH

## 2011-05-26 LAB — COMPREHENSIVE METABOLIC PANEL
Albumin: 3.7
BUN: 7
Calcium: 9
Chloride: 106
Creatinine, Ser: 0.87
Total Bilirubin: 1

## 2011-05-26 LAB — CBC
HCT: 40.6
MCHC: 33.8
MCV: 88.4
Platelets: 298
RDW: 13.1

## 2011-05-26 LAB — DIFFERENTIAL
Basophils Absolute: 0
Lymphocytes Relative: 20
Monocytes Absolute: 0.5
Neutro Abs: 5.8

## 2011-05-26 LAB — LIPASE, BLOOD: Lipase: 20

## 2011-05-26 LAB — POCT PREGNANCY, URINE: Operator id: 285841

## 2011-05-26 LAB — URINE CULTURE

## 2011-05-26 LAB — URINALYSIS, ROUTINE W REFLEX MICROSCOPIC
Bilirubin Urine: NEGATIVE
Glucose, UA: NEGATIVE
Ketones, ur: NEGATIVE
Protein, ur: NEGATIVE
pH: 5.5

## 2011-05-26 LAB — URINE MICROSCOPIC-ADD ON

## 2011-06-22 ENCOUNTER — Ambulatory Visit: Payer: Self-pay | Admitting: Internal Medicine

## 2011-06-22 DIAGNOSIS — I2699 Other pulmonary embolism without acute cor pulmonale: Secondary | ICD-10-CM

## 2011-06-22 DIAGNOSIS — Z7901 Long term (current) use of anticoagulants: Secondary | ICD-10-CM

## 2011-07-10 IMAGING — RF DG CHOLANGIOGRAM OPERATIVE
1 series · 3 of 3 positions shown · non-contrast
Comparison: Abdominal ultrasound from the same day.

Fluoroscopy time of 0.2 minutes was utilized.

CLINICAL DATA: 30-year-old female undergoing cholecystectomy.

INTRAOPERATIVE CHOLANGIOGRAM
TECHNIQUE: Cholangiographic images from the C-arm fluoroscopic
device were submitted for interpretation post-operatively.  Please
see the procedural report for the amount of contrast and the
fluoroscopy time utilized.

[Series 1: run · 3 of 65 frames shown]
[frame 10/65]
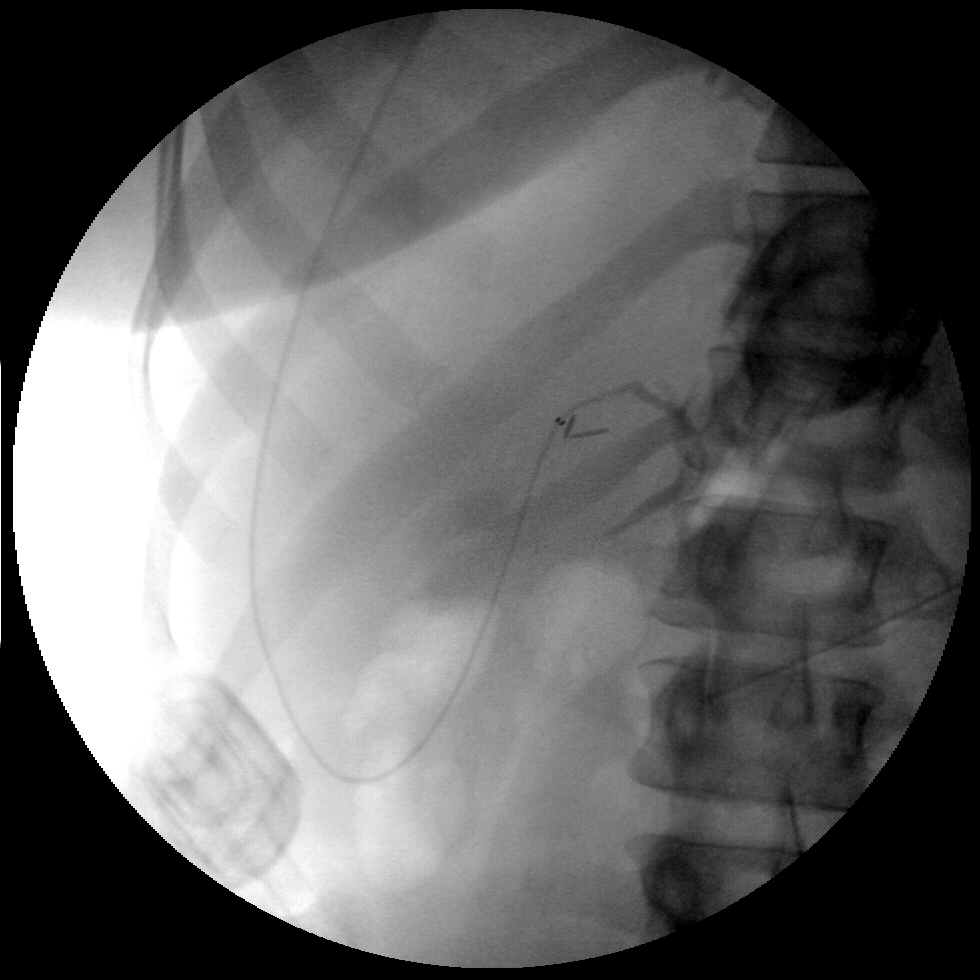
[frame 33/65]
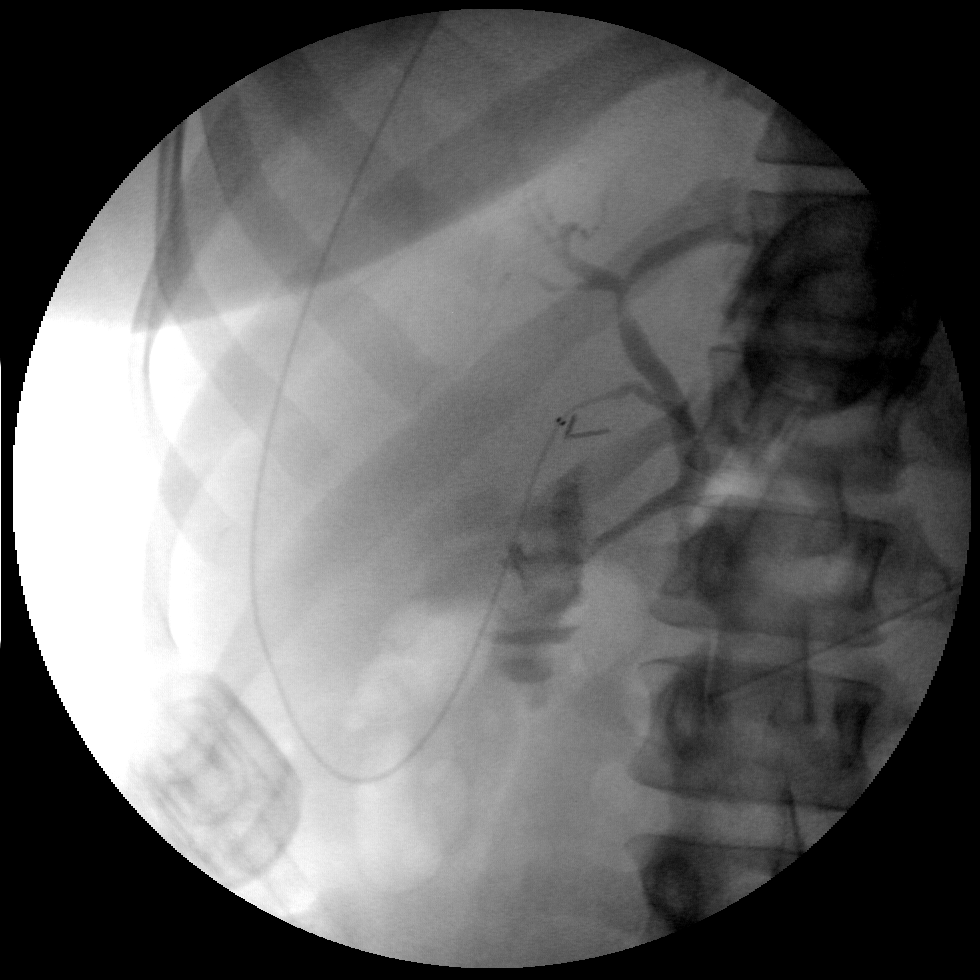
[frame 56/65]
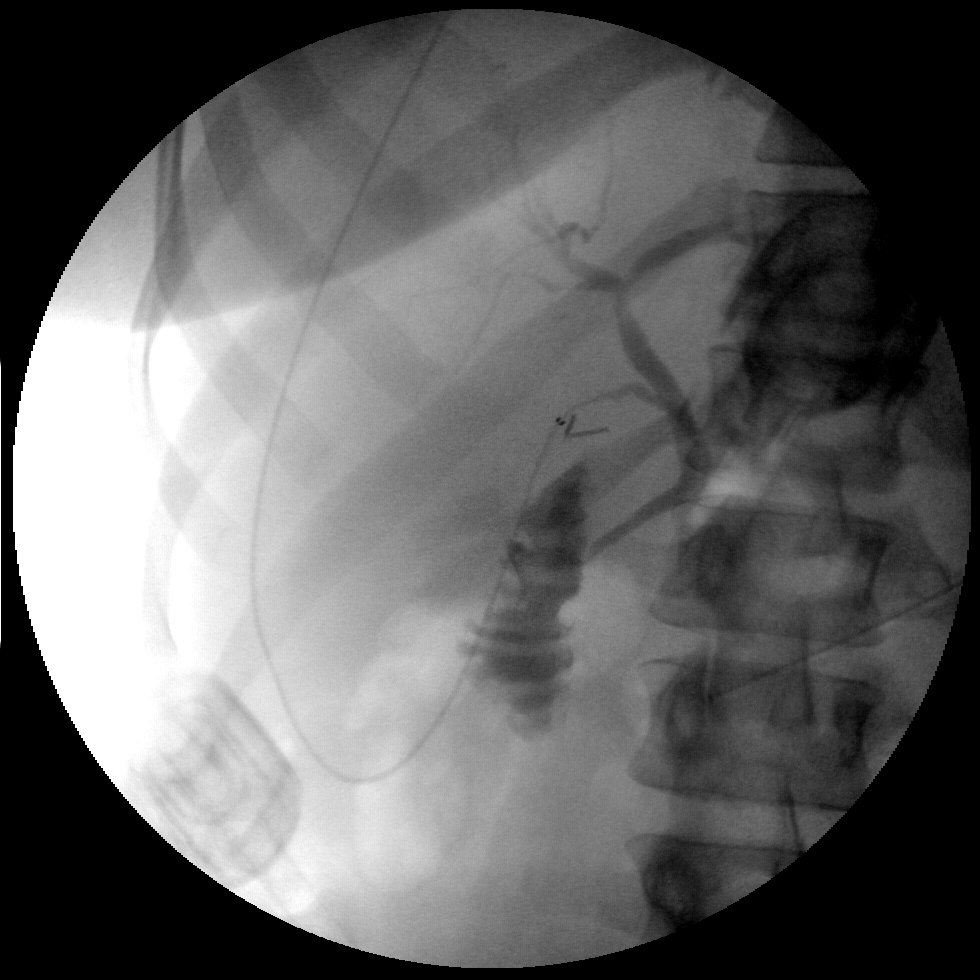

[3 of 3 positions shown; findings below may reference images not displayed]

FINDINGS: Multiple intraoperative fluoroscopic images of the right
upper quadrant.  Surgical clips at the level of the cystic duct
which is cannulated.  Contrast is injected.  Intrahepatic and
extrahepatic biliary tree are nondilated.  There is prompt emptying
of contrast to the duodenum.  No filling defect or extravasation
identified.
IMPRESSION: Negative intraoperative cholangiogram.

## 2013-04-23 ENCOUNTER — Other Ambulatory Visit: Payer: Self-pay | Admitting: Internal Medicine

## 2013-04-23 DIAGNOSIS — R209 Unspecified disturbances of skin sensation: Secondary | ICD-10-CM

## 2013-04-28 ENCOUNTER — Ambulatory Visit
Admission: RE | Admit: 2013-04-28 | Discharge: 2013-04-28 | Disposition: A | Discharge status: Home / Self Care | Payer: BC Managed Care – PPO | Source: Ambulatory Visit | Attending: Internal Medicine | Admitting: Internal Medicine

## 2013-04-28 DIAGNOSIS — R209 Unspecified disturbances of skin sensation: Secondary | ICD-10-CM

## 2014-03-04 ENCOUNTER — Encounter (HOSPITAL_COMMUNITY): Payer: Self-pay | Admitting: Emergency Medicine

## 2014-03-04 ENCOUNTER — Emergency Department (HOSPITAL_COMMUNITY)
Admission: EM | Admit: 2014-03-04 | Discharge: 2014-03-04 | Disposition: A | Discharge status: Home / Self Care | Payer: BC Managed Care – PPO | Attending: Emergency Medicine | Admitting: Emergency Medicine

## 2014-03-04 ENCOUNTER — Emergency Department (HOSPITAL_COMMUNITY): Payer: BC Managed Care – PPO

## 2014-03-04 ENCOUNTER — Telehealth: Payer: Self-pay | Admitting: Internal Medicine

## 2014-03-04 DIAGNOSIS — Z3202 Encounter for pregnancy test, result negative: Secondary | ICD-10-CM | POA: Insufficient documentation

## 2014-03-04 DIAGNOSIS — Z791 Long term (current) use of non-steroidal anti-inflammatories (NSAID): Secondary | ICD-10-CM | POA: Insufficient documentation

## 2014-03-04 DIAGNOSIS — Z86711 Personal history of pulmonary embolism: Secondary | ICD-10-CM | POA: Insufficient documentation

## 2014-03-04 DIAGNOSIS — R0602 Shortness of breath: Secondary | ICD-10-CM

## 2014-03-04 DIAGNOSIS — J45901 Unspecified asthma with (acute) exacerbation: Secondary | ICD-10-CM | POA: Insufficient documentation

## 2014-03-04 HISTORY — DX: Unspecified asthma, uncomplicated: J45.909

## 2014-03-04 HISTORY — DX: Other pulmonary embolism without acute cor pulmonale: I26.99

## 2014-03-04 LAB — I-STAT TROPONIN, ED: Troponin i, poc: 0 ng/mL (ref 0.00–0.08)

## 2014-03-04 LAB — CBC
HCT: 41.6 % (ref 36.0–46.0)
Hemoglobin: 13.9 g/dL (ref 12.0–15.0)
MCH: 29.3 pg (ref 26.0–34.0)
MCHC: 33.4 g/dL (ref 30.0–36.0)
MCV: 87.6 fL (ref 78.0–100.0)
PLATELETS: 318 10*3/uL (ref 150–400)
RBC: 4.75 MIL/uL (ref 3.87–5.11)
RDW: 12.5 % (ref 11.5–15.5)
WBC: 8.6 10*3/uL (ref 4.0–10.5)

## 2014-03-04 LAB — PREGNANCY, URINE: PREG TEST UR: NEGATIVE

## 2014-03-04 LAB — BASIC METABOLIC PANEL
Anion gap: 15 (ref 5–15)
BUN: 11 mg/dL (ref 6–23)
CALCIUM: 9.2 mg/dL (ref 8.4–10.5)
CO2: 23 meq/L (ref 19–32)
Chloride: 102 mEq/L (ref 96–112)
Creatinine, Ser: 0.79 mg/dL (ref 0.50–1.10)
GFR calc Af Amer: >90 mL/min (ref >90)
GFR calc non Af Amer: >90 mL/min (ref >90)
Glucose, Bld: 81 mg/dL (ref 70–99)
POTASSIUM: 3.8 meq/L (ref 3.7–5.3)
SODIUM: 140 meq/L (ref 137–147)

## 2014-03-04 LAB — PRO B NATRIURETIC PEPTIDE: PRO B NATRI PEPTIDE: 46.1 pg/mL (ref 0–125)

## 2014-03-04 LAB — D-DIMER, QUANTITATIVE: D-Dimer, Quant: <0.27 ug/mL-FEU (ref 0.00–0.48)

## 2014-03-04 MED ORDER — ACETAMINOPHEN 500 MG PO TABS
1000.0000 mg | ORAL_TABLET | Freq: Once | ORAL | Status: AC
  Administered 2014-03-04: 1000 mg via ORAL
  Filled 2014-03-04: qty 2

## 2014-03-04 NOTE — ED Notes (Signed)
Pt c/o chest pain and SOB, states about 4 years ago she had a PE and this feels the same. Pt states last night her left arm was slightly numb.

## 2014-03-04 NOTE — ED Provider Notes (Signed)
CSN: [id number]     Arrival date & time 03/04/14  1127 History   First MD Initiated Contact with Patient 03/04/14 1135     Chief Complaint  Patient presents with  . Chest Pain  . Shortness of Breath     (Consider location/radiation/quality/duration/timing/severity/associated sxs/prior Treatment) Patient is a 33 y.o. female presenting with shortness of breath.  Shortness of Breath Severity:  Moderate Onset quality:  Gradual Duration:  3 days Timing:  Constant Progression:  Unchanged Chronicity:  Recurrent Context comment:  Started with right side chest pain a few days ago, that symptom has resolved, but SOB has persisted.  Relieved by:  Nothing Exacerbated by: worse at night. Associated symptoms: chest pain (Initially sharp and right sided.  That resolved.  Now central and pressure like. )   Associated symptoms: no diaphoresis   Associated symptoms comment:  Palpitations, left arm tingling (both now resolved)   Past Medical History  Diagnosis Date  . PE (pulmonary embolism)   . Asthma    Past Surgical History  Procedure Laterality Date  . Cholecystectomy    . Tonsillectomy     No family history on file. History  Substance Use Topics  . Smoking status: Never Smoker   . Smokeless tobacco: Not on file  . Alcohol Use: Yes   OB History   Grav Para Term Preterm Abortions TAB SAB Ect Mult Living                 Review of Systems  Constitutional: Negative for diaphoresis.  Respiratory: Positive for shortness of breath.   Cardiovascular: Positive for chest pain (Initially sharp and right sided.  That resolved.  Now central and pressure like. ).  All other systems reviewed and are negative.     Allergies  Review of patient's allergies indicates no known allergies.  Home Medications   Prior to Admission medications   Medication Sig Start Date End Date Taking? Authorizing Provider  naproxen sodium (ANAPROX) 220 MG tablet Take 220 mg by mouth 2 (two) times daily as  needed (pain).   Yes Historical Provider, MD   BP 126/95  Pulse 75  Temp(Src) 98.6 F (37 C) (Oral)  Resp 20  SpO2 100% Physical Exam  Nursing note and vitals reviewed. Constitutional: She is oriented to person, place, and time. She appears well-developed and well-nourished. No distress.  HENT:  Head: Normocephalic and atraumatic.  Mouth/Throat: Oropharynx is clear and moist.  Eyes: Conjunctivae are normal. Pupils are equal, round, and reactive to light. No scleral icterus.  Neck: Neck supple.  Cardiovascular: Normal rate, regular rhythm, normal heart sounds and intact distal pulses.   No murmur heard. Pulmonary/Chest: Effort normal and breath sounds normal. No stridor. No respiratory distress. She has no rales.  Abdominal: Soft. Bowel sounds are normal. She exhibits no distension. There is no tenderness.  Musculoskeletal: Normal range of motion.  Neurological: She is alert and oriented to person, place, and time.  Skin: Skin is warm and dry. No rash noted.  Psychiatric: She has a normal mood and affect. Her behavior is normal.    ED Course  Procedures (including critical care time) Labs Review Labs Reviewed  CBC  BASIC METABOLIC PANEL  PRO B NATRIURETIC PEPTIDE  PREGNANCY, URINE  D-DIMER, QUANTITATIVE  I-STAT TROPOININ, ED    Imaging Review Dg Chest 2 View  03/04/2014   CLINICAL DATA:  Shortness of breath and chest pain with history of asthma and pulmonary embolism  EXAM: CHEST  2 VIEW  COMPARISON:  PA and lateral chest x-ray of February 17, 2010 and CT scan of the chest of December 21, 2010.  FINDINGS: The lungs are adequately inflated. There is no focal infiltrate. The heart and pulmonary vascularity are within the limits of normal. There is no pleural effusion. The bony thorax is unremarkable.  IMPRESSION: There is no acute cardiopulmonary abnormality.   Electronically Signed   By: David  [place]   On: 03/04/2014 13:09  All radiology studies independently viewed by me.      EKG  Interpretation   Date/Time:  Monday March 04 2014 11:38:04 EDT Ventricular Rate:  73 PR Interval:  144 QRS Duration: 81 QT Interval:  391 QTC Calculation: 431 R Axis:   55 Text Interpretation:  Sinus rhythm Baseline wander in lead(s) V1 No old  tracing to compare Confirmed by [facility]  MD, TREY ([id number]) on 03/04/2014  12:51:40 PM      MDM   Final diagnoses:  Shortness of breath    33 yo female with shortness of breath and chest pain.  Hx of PE associated with pregnancy, completed course of coumadin 3+years ago.  Her symptoms are atypical for PE, ACS, dissection.  Unremarkable exam.    I think PE unlikely, but due to prior PE, will check D Dimer.    Workup negative.  Pt remains well appearing, no respiratory distress, clear lung sounds.  Appears stable for dc.  Return precautions given and PCP follow up advised.   Candyce ChurnJohn David Sherryl Valido III, MD 03/04/14 [phone]

## 2014-03-04 NOTE — Telephone Encounter (Signed)
Called spoke with pt. appt scheduled to see RA tomorrow at 3:30. Nothing fruther needed

## 2014-03-04 NOTE — Discharge Instructions (Signed)

## 2014-03-05 ENCOUNTER — Ambulatory Visit (INDEPENDENT_AMBULATORY_CARE_PROVIDER_SITE_OTHER): Payer: BC Managed Care – PPO | Admitting: Pulmonary Disease

## 2014-03-05 ENCOUNTER — Encounter: Payer: Self-pay | Admitting: Pulmonary Disease

## 2014-03-05 ENCOUNTER — Other Ambulatory Visit: Payer: BC Managed Care – PPO

## 2014-03-05 VITALS — BP 110/76 | HR 98 | Temp 98.0°F | Ht 64.25 in | Wt 175.2 lb

## 2014-03-05 DIAGNOSIS — J45909 Unspecified asthma, uncomplicated: Secondary | ICD-10-CM

## 2014-03-05 MED ORDER — ALBUTEROL SULFATE 108 (90 BASE) MCG/ACT IN AEPB: 2.0000 | INHALATION_SPRAY | Freq: Four times a day (QID) | RESPIRATORY_TRACT | Status: DC | PRN

## 2014-03-05 MED ORDER — PREDNISONE 10 MG PO TABS: ORAL_TABLET | ORAL | Status: DC

## 2014-03-05 MED ORDER — BUDESONIDE-FORMOTEROL FUMARATE 160-4.5 MCG/ACT IN AERO: 2.0000 | INHALATION_SPRAY | Freq: Two times a day (BID) | RESPIRATORY_TRACT | Status: DC

## 2014-03-05 NOTE — Assessment & Plan Note (Addendum)
You have persistent asthma -it is surprising that she has not had more symptoms. No clear trigger apparent. Take 4 tabs  daily with food x 4 days, then 3 tabs daily x 4 days, then 2 tabs daily x 4 days, then 1 tab daily x4 days then stop. #40 Symbicort 160 2 puffs twice daily (sample) - Rinse mouth after use Albuterol MDI 2 puffs every 6h as needed Call for Rx if this works. Allergy blood test

## 2014-03-05 NOTE — Progress Notes (Signed)
Subjective:    Patient ID: Kelsey Friedman, female    DOB: [birth year], 33 y.o.   MRN: [id number]  HPI  PCP - polite  33 year old never smoker presents for evaluation of dyspnea and heavy breathing with occasional wheezing. She has a history of pulmonary embolism in the right upper lobe noted on CT angiogram 02/17/10, immediately postpartum. Curiously, followup CT in June 03/03/10 did not show any emboli. Venous duplex was negative for DVT. Spirometry on 05/05/10 showed moderate airway obstruction with FEV1 of 1.47-54% with ratio 64. She was diagnosed with asthma, started on inhaled steroid and Foradil and followup spirometry on 05/19/10 showed FEV1 of 1.65-61% with ratio 71.  Spirometry shows severe airway obstruction with FEV1 of 0.75 and ratio of 37. There is some flattening of the expiratory loop, which was also noted on the spirometry from 05/05/10 She returned from [place] to [place] 2 weeks ago and developed increased dyspnea. Has been noted heavy breathing and occasional wheezing. She denies sputum production, or fevers. She developed substernal chest pain on 03/04/10 and went to the emergency room, d-dimer was negative, chest x-ray did not show any infiltrates or effusions Is no history of leg swelling, cough or hemoptysis She denies obvious symptoms of heartburn or postnasal drip  Past Medical History  Diagnosis Date  . PE (pulmonary embolism)   . Asthma     Past Surgical History  Procedure Laterality Date  . Cholecystectomy    . Tonsillectomy      No Known Allergies  History   Social History  . Marital Status: Married    Spouse Name: N/A    Number of Children: N/A  . Years of Education: N/A   Occupational History  . Not on file.   Social History Main Topics  . Smoking status: Never Smoker   . Smokeless tobacco: Not on file  . Alcohol Use: Yes     Comment: socially  . Drug Use: No  . Sexual Activity: Not on file   Other Topics Concern  . Not on file    Social History Narrative  . No narrative on file    Family History  Problem Relation Age of Onset  . Emphysema Maternal Uncle   . Allergies Daughter   . Cancer Sister     hodgkin     Review of Systems  Constitutional: Positive for unexpected weight change. Negative for fever.  HENT: Negative for congestion, dental problem, ear pain, nosebleeds, postnasal drip, rhinorrhea, sinus pressure, sneezing, sore throat and trouble swallowing.   Eyes: Negative for redness and itching.  Respiratory: Positive for cough and shortness of breath. Negative for chest tightness and wheezing.   Cardiovascular: Positive for chest pain and palpitations. Negative for leg swelling.  Gastrointestinal: Negative for nausea and vomiting.  Genitourinary: Negative for dysuria.  Musculoskeletal: Negative for joint swelling.  Skin: Negative for rash.  Neurological: Negative for headaches.  Hematological: Does not bruise/bleed easily.  Psychiatric/Behavioral: Negative for dysphoric mood. The patient is not nervous/anxious.        Objective:   Physical Exam  Gen. Pleasant, well-nourished, in no distress, normal affect ENT - no lesions, no post nasal drip Neck: No JVD, no thyromegaly, no carotid bruits Lungs: no use of accessory muscles, no dullness to percussion, decreased right base without rales or rhonchi  Cardiovascular: Rhythm regular, heart sounds  normal, no murmurs or gallops, no peripheral edema Abdomen: soft and non-tender, no hepatosplenomegaly, BS normal. Musculoskeletal: No deformities, no cyanosis or clubbing  Neuro:  alert, non focal       Assessment & Plan:

## 2014-03-05 NOTE — Patient Instructions (Signed)
You have persistent asthma Take 4 tabs  daily with food x 4 days, then 3 tabs daily x 4 days, then 2 tabs daily x 4 days, then 1 tab daily x4 days then stop. #40 Symbicort 160 2 puffs twice daily (sample) - Rinse mouth after use Albuterol MDI 2 puffs every 6h as needed Call for Rx if this works. Allergy blood test

## 2014-03-06 ENCOUNTER — Institutional Professional Consult (permissible substitution): Payer: BC Managed Care – PPO | Admitting: Internal Medicine

## 2014-03-06 LAB — ALLERGY FULL PROFILE
ALTERNARIA ALTERNATA: 15.8 kU/L — AB
Allergen, D pternoyssinus,d7: 1.75 kU/L — ABNORMAL HIGH
Allergen,Goose feathers, e70: <0.1 kU/L
Aspergillus fumigatus, m3: 3.25 kU/L — ABNORMAL HIGH
Bahia Grass: 5.79 kU/L — ABNORMAL HIGH
Bermuda Grass: 0.9 kU/L — ABNORMAL HIGH
Box Elder IgE: 2.11 kU/L — ABNORMAL HIGH
CANDIDA ALBICANS: 16 kU/L — AB
CURVULARIA LUNATA: 1.91 kU/L — AB
Common Ragweed: 0.63 kU/L — ABNORMAL HIGH
D. farinae: 2.61 kU/L — ABNORMAL HIGH
DOG DANDER: 0.14 kU/L — AB
Elm IgE: 5.14 kU/L — ABNORMAL HIGH
FESCUE: 7.22 kU/L — AB
G005 Rye, Perennial: 6.02 kU/L — ABNORMAL HIGH
G009 RED TOP: 6.85 kU/L — AB
GOLDENROD: 0.88 kU/L — AB
HELMINTHOSPORIUM HALODES: 3.5 kU/L — AB
House Dust Hollister: 0.11 kU/L — ABNORMAL HIGH
IgE (Immunoglobulin E), Serum: 266.8 IU/mL — ABNORMAL HIGH (ref 0.0–180.0)
LAMB'S QUARTERS CLASS: 0.38 kU/L — AB
Oak: 2.07 kU/L — ABNORMAL HIGH
Plantain: 2.56 kU/L — ABNORMAL HIGH
Stemphylium Botryosum: 10.3 kU/L — ABNORMAL HIGH
Sycamore Tree: 3.68 kU/L — ABNORMAL HIGH
Timothy Grass: 6.2 kU/L — ABNORMAL HIGH

## 2014-03-07 ENCOUNTER — Telehealth: Payer: Self-pay | Admitting: Pulmonary Disease

## 2014-03-07 NOTE — Telephone Encounter (Signed)
Strong positive to grass, mold Start zyrtec daily (if not on)   I spoke with patient about results and she verbalized understanding and had no questions

## 2014-03-07 NOTE — Progress Notes (Signed)
Quick Note:  lmomtcb for pt ______ 

## 2014-03-18 ENCOUNTER — Ambulatory Visit (INDEPENDENT_AMBULATORY_CARE_PROVIDER_SITE_OTHER): Payer: BC Managed Care – PPO | Admitting: Adult Health

## 2014-03-18 ENCOUNTER — Other Ambulatory Visit: Payer: Self-pay | Admitting: Pulmonary Disease

## 2014-03-18 ENCOUNTER — Encounter: Payer: Self-pay | Admitting: Adult Health

## 2014-03-18 VITALS — BP 106/80 | HR 88 | Temp 98.0°F | Ht 64.0 in | Wt 177.0 lb

## 2014-03-18 DIAGNOSIS — J45909 Unspecified asthma, uncomplicated: Secondary | ICD-10-CM

## 2014-03-18 MED ORDER — BUDESONIDE-FORMOTEROL FUMARATE 160-4.5 MCG/ACT IN AERO: 2.0000 | INHALATION_SPRAY | Freq: Two times a day (BID) | RESPIRATORY_TRACT | Status: DC

## 2014-03-18 MED ORDER — LEVALBUTEROL HCL 0.63 MG/3ML IN NEBU
0.6300 mg | INHALATION_SOLUTION | Freq: Once | RESPIRATORY_TRACT | Status: AC
  Administered 2014-03-18: 0.63 mg via RESPIRATORY_TRACT

## 2014-03-18 MED ORDER — MONTELUKAST SODIUM 10 MG PO TABS: 10.0000 mg | ORAL_TABLET | Freq: Every day | ORAL | Status: DC

## 2014-03-18 NOTE — Progress Notes (Signed)
Reviewed & agree with plan  

## 2014-03-18 NOTE — Addendum Note (Signed)
Addended by: Nicanor AlconNAGLE, Teng Decou K on: 03/18/2014 11:29 AM   Modules accepted: Orders

## 2014-03-18 NOTE — Assessment & Plan Note (Addendum)
Slow to resolve asthma flare w/ severe airflow obstruction on PFT  Allergy profile very posiitive with high IgE xopenex neb x 1   Plan  Increase Symbicort 160/4.535mcg 2 puffs Twice daily . -Rinse after use.  Add Singulair 10mg  At bedtime   Continue on Zyrtec 10mg  At bedtime  .  We are referring you to Allergy doctor.  Use ProAir HFA 2 puffs every 4hr as needed. -this is your rescue inhaler  Prednisone taper over next week.  follow up Dr. Vassie LollAlva  In 3 weeks and As needed   Please contact office for sooner follow up if symptoms do not improve or worsen or seek emergency care

## 2014-03-18 NOTE — Patient Instructions (Signed)
Increase Symbicort 160/4.355mcg 2 puffs Twice daily . -Rinse after use.  Add Singulair 10mg  At bedtime   Continue on Zyrtec 10mg  At bedtime  .  We are referring you to Allergy doctor.  Use ProAir HFA 2 puffs every 4hr as needed. -this is your rescue inhaler  Prednisone taper over next week.  follow up Dr. Vassie LollAlva  In 3 weeks and As needed   Please contact office for sooner follow up if symptoms do not improve or worsen or seek emergency care

## 2014-03-18 NOTE — Progress Notes (Signed)
Subjective:    Patient ID: Kelsey Friedman, female    DOB: 07/24/1981, 34 y.o.   MRN: [id number]  HPI  PCP - polite  33 year old never smoker presents for evaluation of dyspnea and heavy breathing with occasional wheezing. She has a history of pulmonary embolism in the right upper lobe noted on CT angiogram 02/17/10, immediately postpartum. Curiously, followup CT in June 03/03/10 did not show any emboli. Venous duplex was negative for DVT. Spirometry on 05/05/10 showed moderate airway obstruction with FEV1 of 1.47-54% with ratio 64. She was diagnosed with asthma, started on inhaled steroid and Foradil and followup spirometry on 05/19/10 showed FEV1 of 1.65-61% with ratio 71.  Spirometry shows severe airway obstruction with FEV1 of 0.75 and ratio of 37. There is some flattening of the expiratory loop, which was also noted on the spirometry from 05/05/10 She returned from a family vacation to [place] 2 weeks ago and developed increased dyspnea. Has been noted heavy breathing and occasional wheezing. She denies sputum production, or fevers. She developed substernal chest pain on 03/04/10 and went to the emergency room, d-dimer was negative, chest x-ray did not show any infiltrates or effusions Is no history of leg swelling, cough or hemoptysis She denies obvious symptoms of heartburn or postnasal drip >>pred taper , rx symbicort   03/18/2014 Follow up  Patient returns for a one-week followup. Patient had an asthma exacerbation. Last visit. Was given a prednisone taper. Started on Symbicort Twice daily   Since last visit. Patient is feeling better but not back to baseline. Only taking Symbicort daily . Using ProAir daily .  Patient denies any hemoptysis, orthopnea, PND, or leg swelling Chest x-ray on July 6 showed no acute process Allergy profile was strongly positive, IgE 266 Started on Zyrtec daily Was on recent vacation, returned with cough, wheezing Seen in ER , CXR neg , D. Dimer neg.  She denies  any calf pain or leg swelling.   Review of Systems Constitutional:   No  weight loss, night sweats,  Fevers, chills, fatigue, or  lassitude.  HEENT:   No headaches,  Difficulty swallowing,  Tooth/dental problems, or  Sore throat,                No sneezing, itching, ear ache,  +nasal congestion, post nasal drip,   CV:  No chest pain,  Orthopnea, PND, swelling in lower extremities, anasarca, dizziness, palpitations, syncope.   GI  No heartburn, indigestion, abdominal pain, nausea, vomiting, diarrhea, change in bowel habits, loss of appetite, bloody stools.   Resp:   No chest wall deformity  Skin: no rash or lesions.  GU: no dysuria, change in color of urine, no urgency or frequency.  No flank pain, no hematuria   MS:  No joint pain or swelling.  No decreased range of motion.  No back pain.  Psych:  No change in mood or affect. No depression or anxiety.  No memory loss.         Objective:   Physical Exam  GEN: A/Ox3; pleasant , NAD, well nourished   HEENT:  [place]/AT,  EACs-clear, TMs-wnl, NOSE-clear, THROAT-clear, no lesions, no postnasal drip or exudate noted.   NECK:  Supple w/ fair ROM; no JVD; normal carotid impulses w/o bruits; no thyromegaly or nodules palpated; no lymphadenopathy.  RESP  Faint exp wheeze no accessory muscle use, no dullness to percussion  CARD:  RRR, no m/r/g  , no peripheral edema, pulses intact, no cyanosis or clubbing.  GI:  Soft & nt; nml bowel sounds; no organomegaly or masses detected.  Musco: Warm bil, no deformities or joint swelling noted.   Neuro: alert, no focal deficits noted.    Skin: Warm, no lesions or rashes        Assessment & Plan:

## 2014-03-18 NOTE — Addendum Note (Signed)
Addended by: Nicanor AlconNAGLE, Nikolay Demetriou K on: 03/18/2014 11:33 AM   Modules accepted: Orders

## 2014-03-19 ENCOUNTER — Other Ambulatory Visit: Payer: Self-pay | Admitting: Pulmonary Disease

## 2014-03-19 ENCOUNTER — Encounter: Payer: Self-pay | Admitting: Adult Health

## 2014-03-19 MED ORDER — ALBUTEROL SULFATE 108 (90 BASE) MCG/ACT IN AEPB: 2.0000 | INHALATION_SPRAY | Freq: Four times a day (QID) | RESPIRATORY_TRACT | Status: DC | PRN

## 2014-03-19 NOTE — Telephone Encounter (Signed)
Please advise Dr. Alva thanks 

## 2014-03-20 ENCOUNTER — Other Ambulatory Visit: Payer: Self-pay | Admitting: Pulmonary Disease

## 2014-03-20 NOTE — Telephone Encounter (Signed)
Pt refill request sent by patient: Message     Original authorizing provider: Oretha MilchALVA,RAKESH V., MD        Kelsey Friedman would like a refill of the following medications:    predniSONE (DELTASONE) 10 MG tablet Oretha Milch[ALVA,RAKESH V., MD]        Preferred pharmacy: [id number] - [place], [place] - [address]      Disp Refills Start End    predniSONE (DELTASONE) 10 MG tablet 40 tablet 0 03/20/2014     Sig: Take 4 tabs daily x 4 days, 3 tabs daily x 4 days, 2 tabs daily x 4 days, 1 tab daily x 4 days then stop-take w/ food    Pt just seen 7.20.15 by TP and given another pred taper.  Email sent to pt questioning this.

## 2014-03-21 ENCOUNTER — Encounter: Payer: Self-pay | Admitting: Pulmonary Disease

## 2014-03-21 ENCOUNTER — Ambulatory Visit (INDEPENDENT_AMBULATORY_CARE_PROVIDER_SITE_OTHER): Payer: BC Managed Care – PPO

## 2014-03-21 DIAGNOSIS — J45909 Unspecified asthma, uncomplicated: Secondary | ICD-10-CM

## 2014-03-21 MED ORDER — PREDNISONE 10 MG PO TABS: ORAL_TABLET | ORAL | Status: DC

## 2014-03-21 NOTE — Telephone Encounter (Signed)
SUre- OK to give sample or Rx

## 2014-03-26 ENCOUNTER — Encounter: Payer: Self-pay | Admitting: Pulmonary Disease

## 2014-03-28 ENCOUNTER — Other Ambulatory Visit: Payer: Self-pay | Admitting: Adult Health

## 2014-04-01 ENCOUNTER — Ambulatory Visit (INDEPENDENT_AMBULATORY_CARE_PROVIDER_SITE_OTHER)
Admission: RE | Admit: 2014-04-01 | Discharge: 2014-04-01 | Disposition: A | Discharge status: Home / Self Care | Payer: BC Managed Care – PPO | Source: Ambulatory Visit | Attending: Adult Health | Admitting: Adult Health

## 2014-04-01 ENCOUNTER — Encounter: Payer: Self-pay | Admitting: Adult Health

## 2014-04-01 ENCOUNTER — Other Ambulatory Visit (INDEPENDENT_AMBULATORY_CARE_PROVIDER_SITE_OTHER): Payer: BC Managed Care – PPO

## 2014-04-01 ENCOUNTER — Ambulatory Visit (INDEPENDENT_AMBULATORY_CARE_PROVIDER_SITE_OTHER): Payer: BC Managed Care – PPO | Admitting: Adult Health

## 2014-04-01 VITALS — BP 118/78 | HR 97 | Temp 98.3°F | Ht 64.25 in | Wt 179.2 lb

## 2014-04-01 DIAGNOSIS — R0989 Other specified symptoms and signs involving the circulatory and respiratory systems: Secondary | ICD-10-CM

## 2014-04-01 DIAGNOSIS — J45909 Unspecified asthma, uncomplicated: Secondary | ICD-10-CM

## 2014-04-01 DIAGNOSIS — R0609 Other forms of dyspnea: Secondary | ICD-10-CM

## 2014-04-01 DIAGNOSIS — R0600 Dyspnea, unspecified: Secondary | ICD-10-CM

## 2014-04-01 LAB — CBC WITH DIFFERENTIAL/PLATELET
Basophils Absolute: 0 10*3/uL (ref 0.0–0.1)
Basophils Relative: 0.3 % (ref 0.0–3.0)
EOS PCT: 2 % (ref 0.0–5.0)
Eosinophils Absolute: 0.2 10*3/uL (ref 0.0–0.7)
HCT: 43.7 % (ref 36.0–46.0)
Hemoglobin: 14.6 g/dL (ref 12.0–15.0)
Lymphocytes Relative: 19 % (ref 12.0–46.0)
Lymphs Abs: 2.1 10*3/uL (ref 0.7–4.0)
MCHC: 33.4 g/dL (ref 30.0–36.0)
MCV: 90.1 fl (ref 78.0–100.0)
MONO ABS: 0.9 10*3/uL (ref 0.1–1.0)
MONOS PCT: 7.9 % (ref 3.0–12.0)
Neutro Abs: 7.8 10*3/uL — ABNORMAL HIGH (ref 1.4–7.7)
Neutrophils Relative %: 70.8 % (ref 43.0–77.0)
PLATELETS: 308 10*3/uL (ref 150.0–400.0)
RBC: 4.85 Mil/uL (ref 3.87–5.11)
RDW: 14.1 % (ref 11.5–15.5)
WBC: 11 10*3/uL — AB (ref 4.0–10.5)

## 2014-04-01 LAB — D-DIMER, QUANTITATIVE (NOT AT ARMC): D DIMER QUANT: 0.35 ug{FEU}/mL (ref 0.00–0.48)

## 2014-04-01 MED ORDER — LEVALBUTEROL HCL 0.63 MG/3ML IN NEBU
0.6300 mg | INHALATION_SOLUTION | Freq: Once | RESPIRATORY_TRACT | Status: AC
  Administered 2014-04-01: 0.63 mg via RESPIRATORY_TRACT

## 2014-04-01 NOTE — Progress Notes (Signed)
Reviewed & agree with plan  

## 2014-04-01 NOTE — Assessment & Plan Note (Signed)
Slow to resolve Exacerbation w/ severe airflow obstruction previous on spirometry -spirometry today  04/01/2014 is much improved  She has a very strong allergy profile which we discussed may be a strong trigger for her. Keep allergy referral as directed.  Would continue on current regimen  xopenex neb given in office  Will hold on additional steroids as no audible wheezes on exam today and spirometry is improved , advised on steroid dangers  Will repeat cxr today to r/o acute process.  Check d dimer as she has hx of PE and has IUD , if positive check ct chest angio  Check cbc w/ diff looking at eosinophil count   Plan  Continue Symbicort 160/4.565mcg 2 puffs Twice daily . -Rinse after use.  Continue Singulair 10mg  At bedtime   Continue on Zyrtec 10mg  At bedtime  .  Keep you referral with Allergy doctor.  Use ProAir HFA 2 puffs every 4hr as needed. -this is your rescue inhaler  Need to get office visit with family doctor regarding persistent chest wall pain.  Labs and chest xray today  Follow up Dr. Vassie LollAlva  In 6 weeks and As needed   Please contact office for sooner follow up if symptoms do not improve or worsen or seek emergency care

## 2014-04-01 NOTE — Patient Instructions (Signed)
Continue Symbicort 160/4.215mcg 2 puffs Twice daily . -Rinse after use.  Continue Singulair 10mg  At bedtime   Continue on Zyrtec 10mg  At bedtime  .  Keep you referral with Allergy doctor.  Use ProAir HFA 2 puffs every 4hr as needed. -this is your rescue inhaler  Need to get office visit with family doctor regarding persistent chest wall pain.  Labs and chest xray today  Follow up Dr. Vassie LollAlva  In 6 weeks and As needed   Please contact office for sooner follow up if symptoms do not improve or worsen or seek emergency care

## 2014-04-01 NOTE — Progress Notes (Signed)
Subjective:    Patient ID: Kelsey Friedman, female    DOB: 1981/05/03, 33 y.o.   MRN: [id number]  HPI  PCP - polite  33 year old never smoker with IOV for  evaluation of dyspnea and heavy breathing with occasional wheezing. She has a history of pulmonary embolism in the right upper lobe noted on CT angiogram 02/17/10, immediately postpartum.  Curiously, followup CT in June 03/03/10 did not show any emboli.  Venous duplex was negative for DVT. Spirometry on 05/05/10 showed moderate airway obstruction with FEV1 of 1.47-54% with ratio 64.  She was diagnosed with asthma, started on inhaled steroid and Foradil and followup spirometry on 05/19/10 showed FEV1 of 1.65-61% with ratio 71.  03/05/14 IOV  Spirometry shows severe airway obstruction with FEV1 of 0.75 and ratio of 37. There is some flattening of the expiratory loop, which was also noted on the spirometry from 05/05/10 She returned from a family vacation to [place] 2 weeks ago and developed increased dyspnea. Has been noted heavy breathing and occasional wheezing. She denies sputum production, or fevers. She developed substernal chest pain on 03/04/14 and went to the emergency room, d-dimer was negative, chest x-ray did not show any infiltrates or effusions Is no history of leg swelling, cough or hemoptysis She denies obvious symptoms of heartburn or postnasal drip >>pred taper , rx symbicort   03/18/14 Follow up  Patient returns for a one-week followup. Patient had an asthma exacerbation. Last visit. Was given a prednisone taper. Started on Symbicort Twice daily   Since last visit. Patient is feeling better but not back to baseline. Only taking Symbicort daily . Using ProAir daily .  Patient denies any hemoptysis, orthopnea, PND, or leg swelling Chest x-ray on July 6 showed no acute process Allergy profile was strongly positive, IgE 266 Started on Zyrtec daily Was on recent vacation, returned with cough, wheezing Seen in ER , CXR neg , D. Dimer  neg.  She denies any calf pain or leg swelling. >>increased Symbicort 160 Twice daily , added singulair  And referred to Allergist , tx pred taper   04/01/2014 Acute OV  Patient presents today for an acute office visit. Patient returns for persistent symptoms of cough, wheezing, and shortness, of breath. Last visit. Patient was instructed to increase her Symbicort 2 puffs twice daily. Singulair was added to her regimen. She was continued on Zyrtec. Allergy profile was strongly positive with an elevated IgE at 266. She was referred to our allergy specialist. She was given another prednisone taper .  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 She has been doing her peak flow meters with target goal. Around 450 her best peak flow has been around 250. In office spirometry today. Has improved with an FEV1 at 1.62 L, 61%, ratio of 70, mid-flows are improved but remained decreased at 39% (. This has improved substantially from her last spirometry with an FEV1 at 28% and mid-flows at, 13%) Chest x-ray was repeated today and showed no acute process noted. Previous labs showed a negative d-dimer as patient has a history of PE and has a current IUD. She denies any exertional chest pain, orthopnea, PND, leg swelling, calf pain, fever or discolored mucus. She says, that she is taking her medications as directed. Patient is using her rescue inhaler on average 1-2 times daily. Patient complains of intermittent chest wall pain, along the upper left  side of her chest for last few months.    It is my belief pt recorded our visit today on her phone without asking my permission or notifying me prior to this.    Review of Systems  Constitutional:   No  weight loss, night sweats,  Fevers, chills, fatigue, or  lassitude.  HEENT:   No headaches,  Difficulty swallowing,  Tooth/dental problems, or   Sore throat,                No sneezing, itching, ear ache,  +nasal congestion, post nasal drip,   CV:  No    Orthopnea, PND, swelling in lower extremities, anasarca, dizziness, palpitations, syncope.   GI  No heartburn, indigestion, abdominal pain, nausea, vomiting, diarrhea, change in bowel habits, loss of appetite, bloody stools.   Resp:   No chest wall deformity  Skin: no rash or lesions.  GU: no dysuria, change in color of urine, no urgency or frequency.  No flank pain, no hematuria   MS:  No joint pain or swelling.  No decreased range of motion.  No back pain.  Psych:  No change in mood or affect. No depression or anxiety.  No memory loss.         Objective:   Physical Exam   GEN: A/Ox3; pleasant , NAD, well nourished   HEENT:  [place]/AT,  EACs-clear, TMs-wnl, NOSE-clear, THROAT-clear, no lesions, no postnasal drip or exudate noted.   NECK:  Supple w/ fair ROM; no JVD; normal carotid impulses w/o bruits; no thyromegaly or nodules palpated; no lymphadenopathy.  RESP  Improved aeration, no wheezing noted.  no accessory muscle use, no dullness to percussion  CARD:  RRR, no m/r/g  , no peripheral edema, pulses intact, no cyanosis or clubbing.  GI:   Soft & nt; nml bowel sounds; no organomegaly or masses detected.  Musco: Warm bil, no deformities or joint swelling noted.   Neuro: alert, no focal deficits noted.    Skin: Warm, no lesions or rashes        Assessment & Plan:

## 2014-04-03 NOTE — Progress Notes (Signed)
Quick Note:  Called spoke with patient, advised of cxr results / recs as stated by TP. Pt verbalized her understanding and denied any questions. ______ 

## 2014-04-03 NOTE — Progress Notes (Signed)
Quick Note:  Called spoke with patient, advised of lab results / recs as stated by TP. Pt verbalized her understanding and denied any questions. ______ 

## 2014-04-29 ENCOUNTER — Ambulatory Visit (INDEPENDENT_AMBULATORY_CARE_PROVIDER_SITE_OTHER): Payer: BC Managed Care – PPO | Admitting: Internal Medicine

## 2014-04-29 ENCOUNTER — Other Ambulatory Visit: Payer: BC Managed Care – PPO

## 2014-04-29 ENCOUNTER — Other Ambulatory Visit: Payer: Self-pay | Admitting: Pulmonary Disease

## 2014-04-29 ENCOUNTER — Encounter: Payer: Self-pay | Admitting: Internal Medicine

## 2014-04-29 ENCOUNTER — Ambulatory Visit (INDEPENDENT_AMBULATORY_CARE_PROVIDER_SITE_OTHER): Payer: BC Managed Care – PPO | Admitting: Pulmonary Disease

## 2014-04-29 VITALS — BP 146/90 | HR 89 | Ht 64.0 in | Wt 183.0 lb

## 2014-04-29 DIAGNOSIS — R0602 Shortness of breath: Secondary | ICD-10-CM

## 2014-04-29 DIAGNOSIS — R0600 Dyspnea, unspecified: Secondary | ICD-10-CM

## 2014-04-29 DIAGNOSIS — J45909 Unspecified asthma, uncomplicated: Secondary | ICD-10-CM

## 2014-04-29 DIAGNOSIS — J3089 Other allergic rhinitis: Secondary | ICD-10-CM

## 2014-04-29 DIAGNOSIS — J309 Allergic rhinitis, unspecified: Secondary | ICD-10-CM

## 2014-04-29 DIAGNOSIS — J302 Other seasonal allergic rhinitis: Secondary | ICD-10-CM

## 2014-04-29 DIAGNOSIS — I2699 Other pulmonary embolism without acute cor pulmonale: Secondary | ICD-10-CM

## 2014-04-29 LAB — PULMONARY FUNCTION TEST
DL/VA % pred: 129 %
DL/VA: 6.28 ml/min/mmHg/L
DLCO UNC % PRED: 86 %
DLCO UNC: 21.3 ml/min/mmHg
FEF 25-75 PRE: 2.31 L/s
FEF 25-75 Post: 3.53 L/sec
FEF2575-%Change-Post: 53 %
FEF2575-%PRED-POST: 116 %
FEF2575-%Pred-Pre: 76 %
FEV1-%Change-Post: 13 %
FEV1-%Pred-Post: 86 %
FEV1-%Pred-Pre: 76 %
FEV1-Post: 2.3 L
FEV1-Pre: 2.02 L
FEV1FVC-%Change-Post: 9 %
FEV1FVC-%Pred-Pre: 99 %
FEV6-%Change-Post: 7 %
FEV6-%Pred-Post: 79 %
FEV6-%Pred-Pre: 74 %
FEV6-POST: 2.49 L
FEV6-PRE: 2.32 L
FEV6FVC-%PRED-POST: 102 %
FEV6FVC-%Pred-Pre: 102 %
FVC-%CHANGE-POST: 3 %
FVC-%PRED-POST: 78 %
FVC-%Pred-Pre: 75 %
FVC-PRE: 2.41 L
FVC-Post: 2.49 L
POST FEV6/FVC RATIO: 100 %
PRE FEV1/FVC RATIO: 84 %
PRE FEV6/FVC RATIO: 100 %
Post FEV1/FVC ratio: 92 %
RV % pred: 66 %
RV: 1.01 L
TLC % PRED: 69 %
TLC: 3.54 L

## 2014-04-29 NOTE — Progress Notes (Signed)
04/29/14- 33 yoF never smoker referred for allergy evaluation by Dr Alva/ [facility].  Husband and daughter here Complains of chest tightness, easy DOE, weakness with onset around time of PE 2011. Some nasal congestion but no recognized problem with exposure to dust, animals, seasonal pollens, and not much change with weather. 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 Better on prednisone and while vacationing in [place]. Mila [place] may bother her eyes. Zyrtec doesn't do much.  She tries to keep house clean- daughter has asthma/ allergy.  Spirometry 04/01/14- moderate obstruction, FEV1 61%. Pizza made throat tight, eyes itch.  Environment- house, dog, no smokers, no mold, has CA, cat. Allergy Profile 03/05/14- Total IgE 266.8w with elevation of most allergens on the panel.  Prior to Admission medications   Medication Sig Start Date End Date Taking? Authorizing Provider  Albuterol Sulfate (PROAIR RESPICLICK) 108 (90 BASE) MCG/ACT AEPB Inhale 2 puffs into the lungs every 6 (six) hours as needed. 03/19/14  Yes Oretha Milch, MD  budesonide-formoterol (SYMBICORT) 160-4.5 MCG/ACT inhaler Inhale 2 puffs into the lungs 2 (two) times daily. 03/18/14  Yes Tammy S Parrett, NP  cetirizine (ZYRTEC) 10 MG tablet Take 10 mg by mouth daily.   Yes Historical Provider, MD  montelukast (SINGULAIR) 10 MG tablet Take 1 tablet (10 mg total) by mouth at bedtime. 03/18/14  Yes Tammy S Parrett, NP  Multiple Vitamin (MULTIVITAMIN) tablet Take 1 tablet by mouth daily.   Yes Historical Provider, MD   Past Medical History  Diagnosis Date  . PE (pulmonary embolism)   . Asthma    Past Surgical History  Procedure Laterality Date  . Cholecystectomy    . Tonsillectomy     Family History  Problem Relation Age of Onset  . Emphysema Maternal Uncle   . Allergies Daughter   . Cancer Sister     hodgkin   History   Social History  . Marital Status: Married    Spouse  Name: N/A    Number of Children: N/A  . Years of Education: N/A   Occupational History  . Not on file.   Social History Main Topics  . Smoking status: Never Smoker   . Smokeless tobacco: Never Used  . Alcohol Use: Yes     Comment: socially  . Drug Use: No  . Sexual Activity: Not on file   Other Topics Concern  . Not on file   Social History Narrative  . No narrative on file   ROS-see HPI Constitutional:   No-   weight loss, night sweats, fevers, chills, fatigue, lassitude. HEENT:   No-  headaches, difficulty swallowing, tooth/dental problems, sore throat,       No-  sneezing, itching, ear ache, nasal congestion, post nasal drip,  CV:  No-   chest pain, orthopnea, PND, swelling in lower extremities, anasarca,                                  dizziness, palpitations Resp: +shortness of breath with exertion or at rest.              No-   productive cough,  No non-productive cough,  No- coughing up of blood.              No-   change in color of mucus.  No- wheezing.   Skin: No-   rash or  lesions. GI:  No-   heartburn, indigestion, abdominal pain, nausea, vomiting, diarrhea,                 change in bowel habits, loss of appetite GU: No-   dysuria, change in color of urine, no urgency or frequency.  No- flank pain. MS:  No-   joint pain or swelling.  No- decreased range of motion.  No- back pain. Neuro-     nothing unusual Psych:  No- change in mood or affect. No depression or anxiety.  No memory loss.  OBJ- Physical Exam General- Alert, Oriented, Affect-appropriate, Distress- none acute Skin- rash-none, lesions- none, excoriation- none Lymphadenopathy- none Head- atraumatic            Eyes- Gross vision intact, PERRLA, conjunctivae and secretions clear            Ears- Hearing, canals-normal            Nose- Clear, no-Septal dev, mucus, polyps, erosion, perforation             Throat- Mallampati III , mucosa clear , drainage- none, tonsils- atrophic Neck- flexible ,  trachea midline, no stridor , thyroid nl, carotid no bruit Chest - symmetrical excursion , unlabored           Heart/CV- RRR , no murmur , no gallop  , no rub, nl s1 s2                           - JVD- none , edema- none, stasis changes- none, varices- none           Lung- clear to P&A, wheeze- none, cough- none , dullness-none, rub- none           Chest wall-  Abd- tender-no, distended-no, bowel sounds-present, HSM- no Br/ Gen/ Rectal- Not done, not indicated Extrem- cyanosis- none, clubbing, none, atrophy- none, strength- nl Neuro- grossly intact to observation

## 2014-04-29 NOTE — Assessment & Plan Note (Signed)
She felt better in [place], with rescue inhaler,and while on prednisone, suggesting an asthma/ bronchitis. Watch change of seasons pending office f/u/ Consider full PFT.

## 2014-04-29 NOTE — Patient Instructions (Signed)
We are going to be watching to try to understand if allergies actually trigger your breathing problems. See if you notice that you are worse in some places, or with some kinds of activity or exposures.  Order- lab- Food IgE profile  Consider the environmental allergy precautions for your own bedroom.

## 2014-04-29 NOTE — Progress Notes (Signed)
PFT done today. 

## 2014-04-29 NOTE — Assessment & Plan Note (Signed)
She doesn't recognize obvious symptom association with the allergens elevated on her profile. Unless it will make a difference, it is harder to push testing with either traditional allergy shots or Xolair in mind. Plan- Food allergy profile, reviewed environmental allergy controls. She will watch for patterns and we will reconsider at next ov.

## 2014-04-29 NOTE — Assessment & Plan Note (Signed)
She dates chest discomfort, rightly or wrongly, to around the time she had PE. She doesn't have R side failure signs. All clot had resolved on CT 2012. Symptoms may no longer represent an organic process.

## 2014-04-30 ENCOUNTER — Encounter: Payer: Self-pay | Admitting: Adult Health

## 2014-04-30 LAB — ALLERGEN FOOD PROFILE SPECIFIC IGE
APPLE: 0.39 kU/L — AB
CORN: 0.79 kU/L — AB
Chicken IgE: <0.1 kU/L
Egg White IgE: <0.1 kU/L
Fish Cod: <0.1 kU/L
IgE (Immunoglobulin E), Serum: 329.8 IU/mL — ABNORMAL HIGH (ref 0.0–180.0)
PEANUT IGE: 0.49 kU/L — AB
SOYBEAN IGE: 0.59 kU/L — AB
Shrimp IgE: 0.92 kU/L — ABNORMAL HIGH
TOMATO IGE: 0.26 kU/L — AB
Tuna IgE: <0.1 kU/L
Wheat IgE: 0.95 kU/L — ABNORMAL HIGH

## 2014-04-30 NOTE — Progress Notes (Signed)
Quick Note:  LMOM TCB x1. ______ 

## 2014-05-08 ENCOUNTER — Telehealth: Payer: Self-pay | Admitting: Internal Medicine

## 2014-05-08 NOTE — Telephone Encounter (Signed)
Notes Recorded by Ronny Bacon, CMA on 05/08/2014 at 9:15 AM LMTCB Notes Recorded by Marcellus Scott, CMA on 05/03/2014 at 5:07 PM lmomtcb x 2 Notes Recorded by Ronny Bacon, CMA on 04/30/2014 at 5:13 PM LMTCB Notes Recorded by Waymon Budge, MD on 04/30/2014 at 4:55 PM Food IgE profile- higher than expected allergy antibodies to a number of common food groups. Consider this a "watch list" to pay attention to. If you find a food causes you problems, then you avoid that food. Ok to eat these and any foods that don't cause symptoms like itching, swelling, cramps, tight throat, wheeze or fainting.   Pt aware of results. Ans will "watch" for any symptoms of foods listed.

## 2014-05-15 ENCOUNTER — Ambulatory Visit: Payer: BC Managed Care – PPO | Admitting: Pulmonary Disease

## 2014-06-18 ENCOUNTER — Ambulatory Visit: Payer: BC Managed Care – PPO | Admitting: Internal Medicine

## 2014-08-07 ENCOUNTER — Other Ambulatory Visit: Payer: Self-pay | Admitting: Nurse Practitioner

## 2014-08-07 ENCOUNTER — Ambulatory Visit
Admission: RE | Admit: 2014-08-07 | Discharge: 2014-08-07 | Disposition: A | Discharge status: Home / Self Care | Payer: BC Managed Care – PPO | Source: Ambulatory Visit | Attending: Nurse Practitioner | Admitting: Nurse Practitioner

## 2014-08-07 DIAGNOSIS — M549 Dorsalgia, unspecified: Secondary | ICD-10-CM

## 2014-08-07 DIAGNOSIS — M542 Cervicalgia: Secondary | ICD-10-CM

## 2014-08-07 DIAGNOSIS — M545 Low back pain: Secondary | ICD-10-CM

## 2014-08-26 ENCOUNTER — Other Ambulatory Visit: Payer: Self-pay | Admitting: Pulmonary Disease

## 2015-06-30 ENCOUNTER — Emergency Department: Payer: BC Managed Care – PPO

## 2015-06-30 ENCOUNTER — Encounter: Payer: Self-pay | Admitting: *Deleted

## 2015-06-30 ENCOUNTER — Emergency Department
Admission: EM | Admit: 2015-06-30 | Discharge: 2015-06-30 | Disposition: A | Discharge status: Home / Self Care | Payer: BC Managed Care – PPO | Attending: Emergency Medicine | Admitting: Emergency Medicine

## 2015-06-30 DIAGNOSIS — Y9389 Activity, other specified: Secondary | ICD-10-CM | POA: Insufficient documentation

## 2015-06-30 DIAGNOSIS — Z79899 Other long term (current) drug therapy: Secondary | ICD-10-CM | POA: Insufficient documentation

## 2015-06-30 DIAGNOSIS — Y998 Other external cause status: Secondary | ICD-10-CM | POA: Insufficient documentation

## 2015-06-30 DIAGNOSIS — Y92410 Unspecified street and highway as the place of occurrence of the external cause: Secondary | ICD-10-CM | POA: Diagnosis not present

## 2015-06-30 DIAGNOSIS — S199XXA Unspecified injury of neck, initial encounter: Secondary | ICD-10-CM | POA: Insufficient documentation

## 2015-06-30 DIAGNOSIS — S29001A Unspecified injury of muscle and tendon of front wall of thorax, initial encounter: Secondary | ICD-10-CM | POA: Diagnosis not present

## 2015-06-30 DIAGNOSIS — S39012A Strain of muscle, fascia and tendon of lower back, initial encounter: Secondary | ICD-10-CM | POA: Insufficient documentation

## 2015-06-30 DIAGNOSIS — T148XXA Other injury of unspecified body region, initial encounter: Secondary | ICD-10-CM

## 2015-06-30 DIAGNOSIS — S79912A Unspecified injury of left hip, initial encounter: Secondary | ICD-10-CM | POA: Diagnosis not present

## 2015-06-30 DIAGNOSIS — S3992XA Unspecified injury of lower back, initial encounter: Secondary | ICD-10-CM | POA: Diagnosis present

## 2015-06-30 DIAGNOSIS — Z7951 Long term (current) use of inhaled steroids: Secondary | ICD-10-CM | POA: Insufficient documentation

## 2015-06-30 MED ORDER — KETOROLAC TROMETHAMINE 60 MG/2ML IM SOLN
30.0000 mg | Freq: Once | INTRAMUSCULAR | Status: AC
  Administered 2015-06-30: 30 mg via INTRAMUSCULAR
  Filled 2015-06-30: qty 2

## 2015-06-30 MED ORDER — IBUPROFEN 200 MG PO TABS: 600.0000 mg | ORAL_TABLET | Freq: Four times a day (QID) | ORAL | Status: DC | PRN

## 2015-06-30 MED ORDER — DIAZEPAM 5 MG PO TABS: 5.0000 mg | ORAL_TABLET | Freq: Two times a day (BID) | ORAL | Status: DC | PRN

## 2015-06-30 MED ORDER — OXYCODONE-ACETAMINOPHEN 5-325 MG PO TABS: 1.0000 | ORAL_TABLET | Freq: Four times a day (QID) | ORAL | Status: DC | PRN

## 2015-06-30 NOTE — ED Notes (Signed)
Patient transported to X-ray 

## 2015-06-30 NOTE — Discharge Instructions (Signed)
You were prescribed a medication that is potentially sedating. Do not drink alcohol, drive or participate in any other potentially dangerous activities while taking this medication as it may make you sleepy. Do not take this medication with any other sedating medications, either prescription or over-the-counter. If you were prescribed Percocet or Vicodin, do not take these with acetaminophen (Tylenol) as it is already contained within these medications.   Opioid pain medications (or "narcotics") can be habit forming.  Use it as little as possible to achieve adequate pain control.  Do not use or use it with extreme caution if you have a history of opiate abuse or dependence.  If you are on a pain contract with your primary care doctor or a pain specialist, be sure to let them know you were prescribed this medication today from the [facility] Department.  This medication is intended for your use only - do not give any to anyone else and keep it in a secure place where nobody else, especially children and pets, have access to it.  It will also cause or worsen constipation, so you may want to consider taking an over-the-counter stool softener while you are taking this medication.  Muscle Strain A muscle strain is an injury that occurs when a muscle is stretched beyond its normal length. Usually a small number of muscle fibers are torn when this happens. Muscle strain is rated in degrees. First-degree strains have the least amount of muscle fiber tearing and pain. Second-degree and third-degree strains have increasingly more tearing and pain.  Usually, recovery from muscle strain takes 1-2 weeks. Complete healing takes 5-6 weeks.  CAUSES  Muscle strain happens when a sudden, violent force placed on a muscle stretches it too far. This may occur with lifting, sports, or a fall.  RISK FACTORS Muscle strain is especially common in athletes.  SIGNS AND SYMPTOMS At the site of the muscle strain,  there may be:  Pain.  Bruising.  Swelling.  Difficulty using the muscle due to pain or lack of normal function. DIAGNOSIS  Your health care provider will perform a physical exam and ask about your medical history. TREATMENT  Often, the best treatment for a muscle strain is resting, icing, and applying cold compresses to the injured area.  HOME CARE INSTRUCTIONS   Use the PRICE method of treatment to promote muscle healing during the first 2-3 days after your injury. The PRICE method involves:  Protecting the muscle from being injured again.  Restricting your activity and resting the injured body part.  Icing your injury. To do this, put ice in a plastic bag. Place a towel between your skin and the bag. Then, apply the ice and leave it on from 15-20 minutes each hour. After the third day, switch to moist heat packs.  Apply compression to the injured area with a splint or elastic bandage. Be careful not to wrap it too tightly. This may interfere with blood circulation or increase swelling.  Elevate the injured body part above the level of your heart as often as you can.  Only take over-the-counter or prescription medicines for pain, discomfort, or fever as directed by your health care provider.  Warming up prior to exercise helps to prevent future muscle strains. SEEK MEDICAL CARE IF:   You have increasing pain or swelling in the injured area.  You have numbness, tingling, or a significant loss of strength in the injured area. MAKE SURE YOU:   Understand these instructions.  Will watch  your condition.  Will get help right away if you are not doing well or get worse.   This information is not intended to replace advice given to you by your health care provider. Make sure you discuss any questions you have with your health care provider.   Document Released: 08/16/2005 Document Revised: 06/06/2013 Document Reviewed: 03/15/2013 Elsevier Interactive Patient Education 2016  Elsevier Inc.  Lumbosacral Strain Lumbosacral strain is a strain of any of the parts that make up your lumbosacral vertebrae. Your lumbosacral vertebrae are the bones that make up the lower third of your backbone. Your lumbosacral vertebrae are held together by muscles and tough, fibrous tissue (ligaments).  CAUSES  A sudden blow to your back can cause lumbosacral strain. Also, anything that causes an excessive stretch of the muscles in the low back can cause this strain. This is typically seen when people exert themselves strenuously, fall, lift heavy objects, bend, or crouch repeatedly. RISK FACTORS  Physically demanding work.  Participation in pushing or pulling sports or sports that require a sudden twist of the back (tennis, golf, baseball).  Weight lifting.  Excessive lower back curvature.  Forward-tilted pelvis.  Weak back or abdominal muscles or both.  Tight hamstrings. SIGNS AND SYMPTOMS  Lumbosacral strain may cause pain in the area of your injury or pain that moves (radiates) down your leg.  DIAGNOSIS Your health care provider can often diagnose lumbosacral strain through a physical exam. In some cases, you may need tests such as X-ray exams.  TREATMENT  Treatment for your lower back injury depends on many factors that your clinician will have to evaluate. However, most treatment will include the use of anti-inflammatory medicines. HOME CARE INSTRUCTIONS   Avoid hard physical activities (tennis, racquetball, waterskiing) if you are not in proper physical condition for it. This may aggravate or create problems.  If you have a back problem, avoid sports requiring sudden body movements. Swimming and walking are generally safer activities.  Maintain good posture.  Maintain a healthy weight.  For acute conditions, you may put ice on the injured area.  Put ice in a plastic bag.  Place a towel between your skin and the bag.  Leave the ice on for 20 minutes, 2-3 times a  day.  When the low back starts healing, stretching and strengthening exercises may be recommended. SEEK MEDICAL CARE IF:  Your back pain is getting worse.  You experience severe back pain not relieved with medicines. SEEK IMMEDIATE MEDICAL CARE IF:   You have numbness, tingling, weakness, or problems with the use of your arms or legs.  There is a change in bowel or bladder control.  You have increasing pain in any area of the body, including your belly (abdomen).  You notice shortness of breath, dizziness, or feel faint.  You feel sick to your stomach (nauseous), are throwing up (vomiting), or become sweaty.  You notice discoloration of your toes or legs, or your feet get very cold. MAKE SURE YOU:   Understand these instructions.  Will watch your condition.  Will get help right away if you are not doing well or get worse.   This information is not intended to replace advice given to you by your health care provider. Make sure you discuss any questions you have with your health care provider.   Document Released: 05/26/2005 Document Revised: 09/06/2014 Document Reviewed: 04/04/2013 Elsevier Interactive Patient Education [employer] Elsevier Inc.
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
## 2017-06-16 ENCOUNTER — Encounter: Payer: Self-pay | Admitting: Allergy & Immunology

## 2017-06-16 ENCOUNTER — Ambulatory Visit (INDEPENDENT_AMBULATORY_CARE_PROVIDER_SITE_OTHER): Payer: BC Managed Care – PPO | Admitting: Allergy & Immunology

## 2017-06-16 VITALS — BP 114/66 | HR 76 | Temp 98.6°F | Resp 16 | Ht 64.0 in | Wt 169.8 lb

## 2017-06-16 DIAGNOSIS — T7800XA Anaphylactic reaction due to unspecified food, initial encounter: Secondary | ICD-10-CM | POA: Diagnosis not present

## 2017-06-16 DIAGNOSIS — T7800XD Anaphylactic reaction due to unspecified food, subsequent encounter: Secondary | ICD-10-CM

## 2017-06-16 DIAGNOSIS — J302 Other seasonal allergic rhinitis: Secondary | ICD-10-CM | POA: Diagnosis not present

## 2017-06-16 DIAGNOSIS — J3089 Other allergic rhinitis: Secondary | ICD-10-CM | POA: Diagnosis not present

## 2017-06-16 DIAGNOSIS — J4530 Mild persistent asthma, uncomplicated: Secondary | ICD-10-CM | POA: Insufficient documentation

## 2017-06-16 MED ORDER — FLUTICASONE FUROATE 100 MCG/ACT IN AEPB: 1.0000 | INHALATION_SPRAY | Freq: Every day | RESPIRATORY_TRACT | 3 refills | Status: DC

## 2017-06-16 MED ORDER — ALBUTEROL SULFATE HFA 108 (90 BASE) MCG/ACT IN AERS: 2.0000 | INHALATION_SPRAY | RESPIRATORY_TRACT | 1 refills | Status: DC | PRN

## 2017-06-16 MED ORDER — EPINEPHRINE 0.3 MG/0.3ML IJ SOAJ: 0.3000 mg | Freq: Once | INTRAMUSCULAR | 2 refills | Status: AC

## 2017-06-16 NOTE — Progress Notes (Signed)
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 Anaphylactic shock due to food, initial encounter - We will get lab work today to clarify allergy to wheat, nuts, and soy - A prescription for AUVI-Q epinephrine pen has been provided and directions for use have been demonstrated - And allergy action plan has been provided - Avoid wheat, soy, and tree nuts until after lab work results  2. Mild persistent asthma without complication - Continue to use albuterol (proAir) 2 puffs every 4 hours as needed for shortness of breath of wheeze - Begin Arnuity Ellipta 1 puff every day Asthma control goals:   Full participation in all desired activities (may need albuterol before activity)  Albuterol use two time or less a week on average (not counting use with activity)  Cough interfering with sleep two time or less a month  Oral steroids no more than once a year  No hospitalizations  3. Seasonal allergic rhinitis due to pollen - Avoidance measures provided - Continue over-the-counter Claritin as needed - Her allergic rhinitis symptoms are not a particularly bad part of her clinical history, therefore I anticipate that immunotherapy would not be necessary.   4. Follow-up: Follow-up with lab results. Follow-up in the office in 8 weeks.      Subjective:   Kelsey Friedman is a 36 y.o. female presenting today for evaluation of  Chief Complaint  Patient presents with  . Allergic Reaction    onset last month     Kelsey Friedman has a history of the following: Patient Active  Problem List   Diagnosis Date Noted  . Seasonal and perennial allergic rhinitis 04/29/2014  . Pulmonary embolism (HCC) 10/28/2010  . Extrinsic asthma, unspecified 05/05/2010    History obtained from: chart review and patient interview.  Kelsey Friedman was referred by Renford Dills, MD.     Kelsey Friedman is a 36 y.o. female presenting for suspected food allergies. She reports symptoms including throat swelling, tongue swelling, tingling and itching under her skin, shortness of breath, abdominal pain, and diarrhea immediately after eating. She is unable to identify any particular food as she eats bread with every meal. She denies elevated blood pressures or headaches or flushing during these episodes. She believes she has been experiencing these symptoms for a while but has really started paying attention to have they're affecting her in August. She called in September to make an appointment that she had had a recent reaction and had taken Benadryl so was not able to get skin testing. She reports runny nose, sneezing, nasal tightness, and stuffy nose is worse in the spring and summer. She uses Claritin on an as-needed basis which she reports as somewhat helpful Her eyes become itchy during the spring and summer requiring her to remove her contacts and wear glasses on Sundays. She occasionally uses Visine which is not very helpful. She does not have any eczema or hives.  Kelsey Friedman reports a history of a pulmonary embolus that occurred 7 years ago shortly after the birth of her daughter for which she was immediately started on Lovenox and transitioned over to  Coumadin which she took for 6 months. Shortly after that she was prescribed albuterol and an inhaled Corticosteroid which she had used infrequently. She reports shortness of breath approximately every other day, no wheezing, some chest tightness, coughing once a week at night, and stopping activity to catch her breath. She contends that she feels like she  is breathing well today.  Family history is positive for food allergies and her daughter including meat, all nuts, watermelon, and cantaloupe. There is no family history of asthma. She reports one niece with eczema.  Otherwise, there is no history of other atopic diseases, including asthma, drug allergies, food allergies, environmental allergies, stinging insect allergies, or urticaria. There is no significant infectious history. Vaccinations are up to date.    Past Medical History: Patient Active Problem List   Diagnosis Date Noted  . Seasonal and perennial allergic rhinitis 04/29/2014  . Pulmonary embolism (HCC) 10/28/2010  . Extrinsic asthma, unspecified 05/05/2010    Medication List:  Allergies as of 06/16/2017   No Known Allergies     Medication List       Accurate as of 06/16/17 11:16 AM. Always use your most recent med list.          cetirizine 10 MG tablet Commonly known as:  ZYRTEC Take 10 mg by mouth daily.   diphenhydrAMINE 25 mg capsule Commonly known as:  BENADRYL Take 25 mg by mouth every 6 (six) hours as needed.   multivitamin tablet Take 1 tablet by mouth daily.   PROAIR HFA 108 (90 Base) MCG/ACT inhaler Generic drug:  albuterol Inhale into the lungs every 6 (six) hours as needed for wheezing or shortness of breath.      Environmental History: Kelsey Friedman lives in a home that is 38 years old with no concern for water damage or mildew. The flooring is carpet throughout with gas heating and central cooling. There are no animals located in or around the house. She does not use dust mite covers. There is no concern for fumes, chemicals, cigarette smoke. She does report that she is exposed to dust.  Past Surgical History: Past Surgical History:  Procedure Laterality Date  . CHOLECYSTECTOMY    . TONSILLECTOMY       Family History: Family History  Problem Relation Age of Onset  . Emphysema Maternal Uncle   . Allergies Daughter   . Cancer Sister         hodgkin  . Allergic rhinitis Neg Hx   . Angioedema Neg Hx   . Asthma Neg Hx   . Eczema Neg Hx   . Urticaria Neg Hx      Social History: Kelsey Friedman lives at home with her husband and her daughter. 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  Review of Systems: a 14-point review of systems is pertinent for what is mentioned in HPI.  Otherwise, all other systems were negative. Constitutional: negative other than that listed in the HPI Eyes: negative other than that listed in the HPI Ears, nose, mouth, throat, and face: negative other than that listed in the HPI Respiratory: negative other than that listed in the HPI Cardiovascular: negative other than that listed in the HPI Gastrointestinal: negative other than that listed in the HPI Genitourinary: negative other than that listed in the HPI Integument: negative other than that listed in the HPI Hematologic: negative other than that listed in the HPI Musculoskeletal: negative other than that listed in the HPI Neurological: negative other than that listed in  the HPI Allergy/Immunologic: negative other than that listed in the HPI    Objective:   Blood pressure 114/66, pulse 76, temperature 98.6 F (37 C), temperature source Oral, resp. rate 16, height 5\' 4"  (1.626 m), weight 169 lb 12.8 oz (77 kg). Body mass index is 29.15 kg/m.   Physical Exam:  General: Alert, interactive, in no acute distress. Eyes: No conjunctival injection present on the right and No conjunctival injection present on the left. PERRL bilaterally. EOMI without pain. No photophobia.  Ears: Right TM pearly gray with normal light reflex and Left TM pearly gray with normal light reflex.  Nose/Throat: External nose within normal limits and septum midline. Turbinates minimally edematous with clear discharge. Posterior oropharynx unremarkable without cobblestoning in the posterior oropharynx. Tonsils unremarklable without exudates.  Tongue without thrush and Geographic  tongue present. Neck: Supple without thyromegaly. Trachea midline. Adenopathy: no enlarged lymph nodes appreciated in the occipital, axillary, epitrochlear, inguinal, or popliteal regions. Lungs: Clear to auscultation without wheezing, rhonchi or rales. No increased work of breathing. CV: Normal S1/S2. No murmurs. Capillary refill <2 seconds.  Abdomen: Nondistended, nontender. No guarding or rebound tenderness. Bowel sounds present in all fields  Skin: Warm and dry, without lesions or rashes. Extremities:  No clubbing, cyanosis or edema. Neuro:   Grossly intact. No focal deficits appreciated. Responsive to questions.   Spirometry: results abnormal (FEV1: 1.89%, FVC: 2.17%, FEV1/FVC: 0.87%). Post test reveals FEV1: 1.85, FVC: 2.21, FEV1R:    Spirometry consistent with mild obstructive disease. Albuterol nebulizer treatment given in clinic with no improvement.  Allergy Studies: Indoor/Outdoor Percutaneous Adult Environmental Panel: positive to bahia grass, [place] grass, [place] blue grass, meadow fescue grass, perennial rye grass, sweet vernal grass, short ragweed, lamb's quarters, sheep sorrel, rough pigweed, common mugwort, ash, birch, American beech, Box elder, red cedar, [place] cottonwood, elm, oak, pecan pollen, [place] sycamore, black walnut pollen, Alternaria, Aspergillus, Drechslera, Aureobasidium, Botrytis, epicoccum and cockroach. Otherwise negative with adequate controls.  Selected Foods Panel: equivocal to wheat and almond. Negative to peanut, milk, egg, casein, shellfish mix, fish mix, cashew, pecan, walnut, hazelnut, and [place] nut. Otherwise negative with adequate controls.   I performed a history and physical examination of the patient and discussed her management with the Nurse Practitioner. I reviewed the Nurse Practitioner's note and agree with the documented findings and plan of care. The note in its entirety was edited by myself, including the physical exam, assessment, and  plan. Kelsey Friedman is having reactions from unknown triggers that are likely histamine mediated, as they respond promptly to Benadryl treatment. Thankfully, none of these episodes have required an ED visit or any serious intervention such as epinephrine. 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 However, typically OAS does not present with a systemic reaction (she is having body wide pruritis in conjunction with her more localized oral and throat symptoms). There is a possibility that she has two different underlying syndromes co-occurring. She did have some mild reactions to tree nuts as well as wheat, and we will get some blood work to further clarify this. Unfortunately, we did not get skin testing for soy due to an error in ordering, but we will get blood work to confirm these anyway.  Kelsey BondsJoel Gallagher, MD [facility]

## 2017-06-16 NOTE — Patient Instructions (Addendum)
1. Anaphylactic shock due to food, initial encounter - We will get lab work today to clarify allergy to wheat, nuts, and soy - A prescription for AUVI-Q epinephrine pen has been provided and directions for use have been demonstrated - And allergy action plan has been provided - Avoid wheat, soy, and tree nuts until after lab work results  2. Mild persistent asthma without complication - Continue to use albuterol (proAir) 2 puffs every 4 hours as needed for shortness of breath of wheeze - Begin Arnuity Ellipta 1 puff every day Asthma control goals:   Full participation in all desired activities (may need albuterol before activity)  Albuterol use two time or less a week on average (not counting use with activity)  Cough interfering with sleep two time or less a month  Oral steroids no more than once a year  No hospitalizations   3. Seasonal allergic rhinitis due to pollen - Avoidance measures provided - Continue over-the-counter Claritin as needed  Control of Mold Allergen Mold and fungi can grow on a variety of surfaces provided certain temperature and moisture conditions exist.  Outdoor molds grow on plants, decaying vegetation and soil.  The major outdoor mold, Alternaria and Cladosporium, are found in very high numbers during hot and dry conditions.  Generally, a late Summer - Fall peak is seen for common outdoor fungal spores.  Rain will temporarily lower outdoor mold spore count, but counts rise rapidly when the rainy period ends.  The most important indoor molds are Aspergillus and Penicillium.  Dark, humid and poorly ventilated basements are ideal sites for mold growth.  The next most common sites of mold growth are the bathroom and the kitchen.  Outdoor MicrosoftMold Control 1. Use air conditioning and keep windows closed 2. Avoid exposure to decaying vegetation. 3. Avoid leaf raking. 4. Avoid grain handling. 5. Consider wearing a face mask if working in moldy areas.  Indoor Mold  Control 1. Maintain humidity below 50%. 2. Clean washable surfaces with 5% bleach solution. 3. Remove sources e.g. Contaminated carpets.   Reducing Pollen Exposure The American Academy of Allergy, Asthma and Immunology suggests the following steps to reduce your exposure to pollen during allergy seasons.    1. Do not hang sheets or clothing out to dry; pollen may collect on these items. 2. Do not mow lawns or spend time around freshly cut grass; mowing stirs up pollen. 3. Keep windows closed at night.  Keep car windows closed while driving. 4. Minimize morning activities outdoors, a time when pollen counts are usually at their highest. 5. Stay indoors as much as possible when pollen counts or humidity is high and on windy days when pollen tends to remain in the air longer. 6. Use air conditioning when possible.  Many air conditioners have filters that trap the pollen spores. 7. Use a HEPA room air filter to remove pollen form the indoor air you breathe.    4. Follow-up: Follow-up with lab results. Follow-up in the office in 8 weeks.

## 2017-06-21 LAB — ALPHA-GAL PANEL
BEEF CLASS INTERPRETATION: 0
Beef (Bos spp) IgE: <0.1 kU/L (ref <0.35)
Class Interpretation: 0
Lamb/Mutton (Ovis spp) IgE: <0.1 kU/L (ref <0.35)
PORK CLASS INTERPRETATION: 0
Pork (Sus spp) IgE: <0.1 kU/L (ref <0.35)

## 2017-06-21 LAB — CBC WITH DIFFERENTIAL/PLATELET
Basophils Absolute: 0 10*3/uL (ref 0.0–0.2)
Basos: 0 %
EOS (ABSOLUTE): 0.1 10*3/uL (ref 0.0–0.4)
Eos: 2 %
Hematocrit: 41.9 % (ref 34.0–46.6)
Hemoglobin: 14.1 g/dL (ref 11.1–15.9)
IMMATURE GRANULOCYTES: 0 %
Immature Grans (Abs): 0 10*3/uL (ref 0.0–0.1)
LYMPHS ABS: 1.9 10*3/uL (ref 0.7–3.1)
Lymphs: 29 %
MCH: 30.1 pg (ref 26.6–33.0)
MCHC: 33.7 g/dL (ref 31.5–35.7)
MCV: 90 fL (ref 79–97)
MONOS ABS: 0.4 10*3/uL (ref 0.1–0.9)
Monocytes: 6 %
NEUTROS PCT: 63 %
Neutrophils Absolute: 4.1 10*3/uL (ref 1.4–7.0)
PLATELETS: 277 10*3/uL (ref 150–379)
RBC: 4.68 x10E6/uL (ref 3.77–5.28)
RDW: 14 % (ref 12.3–15.4)
WBC: 6.6 10*3/uL (ref 3.4–10.8)

## 2017-06-21 LAB — ALLERGY PANEL 18, NUT MIX GROUP
ALLERGEN COCONUT IGE: 0.12 kU/L — AB
Hazelnut (Filbert) IgE: <0.1 kU/L
Peanut IgE: 0.15 kU/L — AB
Pecan Nut IgE: <0.1 kU/L
SESAME SEED IGE: 2.44 kU/L — AB

## 2017-06-21 LAB — ALLERGEN, WHEAT, F4: Wheat IgE: 1.12 kU/L — AB

## 2017-06-21 LAB — TRYPTASE: Tryptase: 4 ug/L (ref 2.2–13.2)

## 2017-06-21 LAB — ALLERGEN SOYBEAN: SOYBEAN IGE: 0.78 kU/L — AB

## 2017-08-11 ENCOUNTER — Ambulatory Visit: Payer: BC Managed Care – PPO | Admitting: Allergy & Immunology

## 2017-11-02 ENCOUNTER — Other Ambulatory Visit: Payer: Self-pay

## 2017-11-02 NOTE — Telephone Encounter (Signed)
Received fax for refill for Arnuity 100. Patient was last seen 06/16/2017. Patient was to return in 8 weeks. Patient needs office visit.

## 2019-05-04 ENCOUNTER — Ambulatory Visit
Admission: RE | Admit: 2019-05-04 | Discharge: 2019-05-04 | Disposition: A | Discharge status: Home / Self Care | Payer: BC Managed Care – PPO | Source: Ambulatory Visit | Attending: Internal Medicine | Admitting: Internal Medicine

## 2019-05-04 ENCOUNTER — Other Ambulatory Visit: Payer: Self-pay | Admitting: Internal Medicine

## 2019-05-04 DIAGNOSIS — R1084 Generalized abdominal pain: Secondary | ICD-10-CM

## 2019-05-14 ENCOUNTER — Other Ambulatory Visit: Payer: Self-pay | Admitting: Internal Medicine

## 2019-05-14 DIAGNOSIS — R1084 Generalized abdominal pain: Secondary | ICD-10-CM

## 2019-05-15 ENCOUNTER — Ambulatory Visit
Admission: RE | Admit: 2019-05-15 | Discharge: 2019-05-15 | Disposition: A | Discharge status: Home / Self Care | Payer: BC Managed Care – PPO | Source: Ambulatory Visit | Attending: Internal Medicine | Admitting: Internal Medicine

## 2019-05-15 DIAGNOSIS — R1084 Generalized abdominal pain: Secondary | ICD-10-CM

## 2019-05-15 MED ORDER — IOPAMIDOL (ISOVUE-300) INJECTION 61%
100.0000 mL | Freq: Once | INTRAVENOUS | Status: AC | PRN
  Administered 2019-05-15: 100 mL via INTRAVENOUS

## 2019-10-25 ENCOUNTER — Ambulatory Visit: Payer: BC Managed Care – PPO | Attending: Family

## 2019-10-25 DIAGNOSIS — Z23 Encounter for immunization: Secondary | ICD-10-CM | POA: Insufficient documentation

## 2019-10-25 NOTE — Progress Notes (Signed)
   Covid-19 Vaccination Clinic  Name:  Kelsey Friedman    MRN: [id number] DOB: Jan 15, 1981  10/25/2019  [name] was observed post Covid-19 immunization for 15 minutes without incidence. She was provided with Vaccine Information Sheet and instruction to access the V-Safe system.   [name] was instructed to call 911 with any severe reactions post vaccine: Marland Kitchen Difficulty breathing  . Swelling of your face and throat  . A fast heartbeat  . A bad rash all over your body  . Dizziness and weakness    Immunizations Administered    Name Date Dose VIS Date Route   Moderna COVID-19 Vaccine 10/25/2019  1:50 PM 0.5 mL 07/31/2019 Intramuscular   Manufacturer: Moderna   Lot: [id number]   NDC: 83073-543-01

## 2019-11-27 ENCOUNTER — Ambulatory Visit: Payer: BC Managed Care – PPO | Attending: Family

## 2019-11-27 DIAGNOSIS — Z23 Encounter for immunization: Secondary | ICD-10-CM

## 2019-11-27 NOTE — Progress Notes (Signed)
   Covid-19 Vaccination Clinic  Name:  Kelsey Friedman    MRN: [id number] DOB: 08-Dec-1980  11/27/2019  [name] was observed post Covid-19 immunization for 15 minutes without incident. She was provided with Vaccine Information Sheet and instruction to access the V-Safe system.   [name] was instructed to call 911 with any severe reactions post vaccine: Marland Kitchen Difficulty breathing  . Swelling of face and throat  . A fast heartbeat  . A bad rash all over body  . Dizziness and weakness   Immunizations Administered    Name Date Dose VIS Date Route   Moderna COVID-19 Vaccine 11/27/2019  1:32 PM 0.5 mL 07/31/2019 Intramuscular   Manufacturer: Moderna   Lot: [id number]   NDC: 43014-840-39

## 2020-07-07 ENCOUNTER — Ambulatory Visit: Payer: Self-pay | Attending: Internal Medicine

## 2020-07-07 DIAGNOSIS — Z23 Encounter for immunization: Secondary | ICD-10-CM

## 2020-07-07 NOTE — Progress Notes (Signed)
   Covid-19 Vaccination Clinic  Name:  Kelsey Friedman    MRN: [id number] DOB: [birth year]  07/07/2020  [name] was observed post Covid-19 immunization for 15 minutes without incident. She was provided with Vaccine Information Sheet and instruction to access the V-Safe system.   [name] was instructed to call 911 with any severe reactions post vaccine: Marland Kitchen Difficulty breathing  . Swelling of face and throat  . A fast heartbeat  . A bad rash all over body  . Dizziness and weakness

## 2021-01-19 ENCOUNTER — Ambulatory Visit: Payer: BC Managed Care – PPO | Attending: Family

## 2021-01-19 DIAGNOSIS — Z23 Encounter for immunization: Secondary | ICD-10-CM

## 2021-01-19 NOTE — Progress Notes (Signed)
   Covid-19 Vaccination Clinic  Name:  Kelsey Friedman    MRN: [id number] DOB: [birth year]  01/19/2021  Kelsey Friedman was observed post Covid-19 immunization for 15 minutes without incident. She was provided with Vaccine Information Sheet and instruction to access the V-Safe system.   Kelsey Friedman was instructed to call 911 with any severe reactions post vaccine: Marland Kitchen Difficulty breathing  . Swelling of face and throat  . A fast heartbeat  . A bad rash all over body  . Dizziness and weakness   Immunizations Administered    Name Date Dose VIS Date Route   Moderna Covid-19 Booster Vaccine 01/19/2021  9:15 AM 0.25 mL 06/18/2020 Intramuscular   Manufacturer: Moderna   Lot: [id number]   NDC: 52481-859-09

## 2021-03-05 ENCOUNTER — Ambulatory Visit
Admission: RE | Admit: 2021-03-05 | Discharge: 2021-03-05 | Disposition: A | Discharge status: Home / Self Care | Payer: BC Managed Care – PPO | Source: Ambulatory Visit | Attending: Internal Medicine | Admitting: Internal Medicine

## 2021-03-05 ENCOUNTER — Other Ambulatory Visit: Payer: Self-pay

## 2021-03-05 ENCOUNTER — Other Ambulatory Visit: Payer: Self-pay | Admitting: Internal Medicine

## 2021-03-05 DIAGNOSIS — R0600 Dyspnea, unspecified: Secondary | ICD-10-CM

## 2021-03-05 MED ORDER — IOPAMIDOL (ISOVUE-370) INJECTION 76%
75.0000 mL | Freq: Once | INTRAVENOUS | Status: AC | PRN
  Administered 2021-03-05: 75 mL via INTRAVENOUS

## 2021-06-16 ENCOUNTER — Ambulatory Visit: Payer: BC Managed Care – PPO | Attending: Family

## 2021-06-16 DIAGNOSIS — Z23 Encounter for immunization: Secondary | ICD-10-CM

## 2021-06-16 NOTE — Progress Notes (Signed)
   Covid-19 Vaccination Clinic  Name:  Kelsey Friedman    MRN: [id number] DOB: 12-23-1980  06/16/2021  [name] was observed post Covid-19 immunization for 15 minutes without incident. She was provided with Vaccine Information Sheet and instruction to access the V-Safe system.   [name] was instructed to call 911 with any severe reactions post vaccine: Difficulty breathing  Swelling of face and throat  A fast heartbeat  A bad rash all over body  Dizziness and weakness

## 2022-08-03 ENCOUNTER — Encounter: Payer: Self-pay | Admitting: Internal Medicine

## 2022-08-03 ENCOUNTER — Ambulatory Visit (INDEPENDENT_AMBULATORY_CARE_PROVIDER_SITE_OTHER): Payer: BC Managed Care – PPO

## 2022-08-03 ENCOUNTER — Ambulatory Visit: Payer: BC Managed Care – PPO | Attending: Internal Medicine | Admitting: Internal Medicine

## 2022-08-03 VITALS — BP 136/94 | HR 78 | Ht 64.25 in | Wt 178.6 lb

## 2022-08-03 DIAGNOSIS — I471 Supraventricular tachycardia, unspecified: Secondary | ICD-10-CM

## 2022-08-03 DIAGNOSIS — Z79899 Other long term (current) drug therapy: Secondary | ICD-10-CM

## 2022-08-03 DIAGNOSIS — R002 Palpitations: Secondary | ICD-10-CM | POA: Diagnosis not present

## 2022-08-03 DIAGNOSIS — R0683 Snoring: Secondary | ICD-10-CM

## 2022-08-03 NOTE — Patient Instructions (Signed)
Medication Instructions:   *If you need a refill on your cardiac medications before your next appointment, please call your pharmacy*   Lab Work:  If you have labs (blood work) drawn today and your tests are completely normal, you will receive your results only by: MyChart Message (if you have MyChart) OR A paper copy in the mail If you have any lab test that is abnormal or we need to change your treatment, we will call you to review the results.   Testing/Procedures: 30 day  Your physician has recommended that you wear an event monitor. Event monitors are medical devices that record the heart's electrical activity. Doctors most often [place] these monitors to diagnose arrhythmias. Arrhythmias are problems with the speed or rhythm of the heartbeat. The monitor is a small, portable device. You can wear one while you do your normal daily activities. This is usually used to diagnose what is causing palpitations/syncope (passing out).  Your physician has recommended that you have a pulmonary function test. Pulmonary Function Tests are a group of tests that measure how well air moves in and out of your lungs.  Your physician has recommended that you have a home sleep study. This test records several body functions during sleep, including: brain activity, eye movement, oxygen and carbon dioxide blood levels, heart rate and rhythm, breathing rate and rhythm, the flow of air through your mouth and nose, snoring, body muscle movements, and chest and belly movement.    Follow-Up: At [facility], you and your health needs are our priority.  As part of our continuing mission to provide you with exceptional heart care, we have created designated Provider Care Teams.  These Care Teams include your primary Cardiologist (physician) and Advanced Practice Providers (APPs -  Physician Assistants and Nurse Practitioners) who all work together to provide you with the care you need, when you need it.  We  recommend signing up for the patient portal called "MyChart".  Sign up information is provided on this After Visit Summary.  MyChart is used to connect with patients for Virtual Visits (Telemedicine).  Patients are able to view lab/test results, encounter notes, upcoming appointments, etc.  Non-urgent messages can be sent to your provider as well.   To learn more about what you can do with MyChart, go to [email or web address].     Important Information About Sugar

## 2022-08-03 NOTE — Progress Notes (Unsigned)
[id number] from office inventory applied to patient

## 2022-08-03 NOTE — Progress Notes (Unsigned)
Cardiology Office Note   Date:  08/03/2022   ID:  Kelsey Friedman, DOB [birth year], MRN [id number]  PCP:  Renford Dills, MD  Cardiologist:   Dietrich Pates, MD   Patient referred for tachycardia       History of Present Illness: Kelsey Friedman is a 41 y.o. female with a history of PE in the past (after birth of 2nd daughter)  REcently the pt was watching TV   Her HR went very fast   Felt like her heart was going to jump out of chest   Associated with some chest tighness.  HR on fit bit was 190s   Some dizziness  No syncope EMS called   Heart rate went down on own    The pt says this was one of the worst spells   She has had small "panic attacks" in the past   One while she wsa scuba diving      When not having spells she has had some tightness in chest with activity   ? If she hs asthma   Startted after birth of 2nd child   Some wheezing      L sided   (small PE was in the RUL)  Active   Tries to walk  Teaches kindergarten   Had a spell of dizziness yesterday    BP usually better  BP meds started 4 months ago     High blood pressure runs in family      Diet:   Br:  Coffee a little sugar     Usually skips eating  Lunch   Cafeteria or salad or slice pizza Dinner:  Spaghetti, chicken  Current Meds  Medication Sig   albuterol (PROAIR HFA) 108 (90 Base) MCG/ACT inhaler Inhale 2 puffs into the lungs every 4 (four) hours as needed for wheezing or shortness of breath.   ALPRAZolam (XANAX) 0.25 MG tablet Take 0.25 mg by mouth as needed for anxiety.   cetirizine (ZYRTEC) 10 MG tablet Take 10 mg by mouth daily.   diphenhydrAMINE (BENADRYL) 25 mg capsule Take 25 mg by mouth every 6 (six) hours as needed.   escitalopram (LEXAPRO) 20 MG tablet Take 20 mg by mouth daily.   Fluticasone Furoate (ARNUITY ELLIPTA) 100 MCG/ACT AEPB Inhale 1 puff into the lungs daily.   levothyroxine (SYNTHROID) 75 MCG tablet Take 75 mcg by mouth every morning.   losartan-hydrochlorothiazide (HYZAAR) 50-12.5 MG  tablet Take 1 tablet by mouth daily.   meloxicam (MOBIC) 15 MG tablet Take 15 mg by mouth daily.   Multiple Vitamin (MULTIVITAMIN) tablet Take 1 tablet by mouth daily.   zolpidem (AMBIEN CR) 12.5 MG CR tablet Take 12.5 mg by mouth at bedtime as needed for sleep.     Allergies:   Patient has no known allergies.   Past Medical History:  Diagnosis Date   Asthma    PE (pulmonary embolism)    SOB (shortness of breath)     Past Surgical History:  Procedure Laterality Date   CHOLECYSTECTOMY     TONSILLECTOMY       Social History:  The patient  reports that she has never smoked. She has never used smokeless tobacco. She reports current alcohol use. She reports that she does not use drugs.   Family History:  The patient's family history includes Allergies in her daughter; Cancer in her sister; Emphysema in her maternal uncle.    ROS:  Please see the history of present illness. All other  systems are reviewed and  Negative to the above problem except as noted.    PHYSICAL EXAM: VS:  BP (!) 136/94   Pulse 78   Ht 5' 4.25" (1.632 m)   Wt 178 lb 9.6 oz (81 kg)   SpO2 99%   BMI 30.42 kg/m   GEN: Obese 41 yo , in no acute distress  HEENT: normal  Neck: no JVD, carotid bruits Cardiac: RRR; no murmurs  No LE edema  Respiratory:  clear to auscultation bilaterally,   No wheezing    GI: soft, nontender, nondistended, + BS  MS: no deformity Moving all extremities   Skin: warm and dry, no rash Neuro:  Strength and sensation are intact Psych: euthymic mood, full affect   EKG:  EKG is ordered today.  NSR 72 bpm   QT 0.46   Lipid Panel No results found for: "CHOL", "TRIG", "HDL", "CHOLHDL", "VLDL", "LDLCALC", "LDLDIRECT"    Wt Readings from Last 3 Encounters:  08/03/22 178 lb 9.6 oz (81 kg)  06/16/17 169 lb 12.8 oz (77 kg)  06/30/15 174 lb (78.9 kg)      ASSESSMENT AND PLAN:  1  Tachycardia   Suspicous for SVT   Will set  up for event moniyot  2  HTN  BP is elevated today    Follow for now    Better on recheck   130/85 Follow   3  SOB   ? Reactive airways    Hx of PE but small, RUL   will get PFTs  4   ? Sleep apnea   Pt with hx snoring,  daytime sleepinesss   Will set up for home sleep eval  5  Diet   Discussed low carb, no SSB low sugar diet   6  Lipids   LDL 109  HDL 47  Tri 91 (March 2023)  Reviewed diet  Follow up based on resutls of tests.   Current medicines are reviewed at length with the patient today.  The patient does not have concerns regarding medicines.  Signed, Dietrich Pates, MD  08/03/2022 3:14 PM    [facility] [address] [place], [place], [place]  [address] Phone: [phone]; Fax: [phone]

## 2022-08-13 ENCOUNTER — Ambulatory Visit: Payer: BC Managed Care – PPO | Admitting: Internal Medicine

## 2022-08-13 DIAGNOSIS — I471 Supraventricular tachycardia, unspecified: Secondary | ICD-10-CM

## 2022-08-13 DIAGNOSIS — R0683 Snoring: Secondary | ICD-10-CM | POA: Diagnosis not present

## 2022-08-13 DIAGNOSIS — R002 Palpitations: Secondary | ICD-10-CM

## 2022-08-13 DIAGNOSIS — Z79899 Other long term (current) drug therapy: Secondary | ICD-10-CM

## 2022-08-13 LAB — PULMONARY FUNCTION TEST
DL/VA % pred: 116 %
DL/VA: 5.11 ml/min/mmHg/L
DLCO cor % pred: 85 %
DLCO cor: 18.6 ml/min/mmHg
DLCO unc % pred: 85 %
DLCO unc: 18.6 ml/min/mmHg
FEF 25-75 Post: 2.32 L/sec
FEF 25-75 Pre: 2.34 L/sec
FEF2575-%Change-Post: 0 %
FEF2575-%Pred-Post: 76 %
FEF2575-%Pred-Pre: 77 %
FEV1-%Change-Post: 9 %
FEV1-%Pred-Post: 67 %
FEV1-%Pred-Pre: 61 %
FEV1-Post: 2.01 L
FEV1-Pre: 1.83 L
FEV1FVC-%Change-Post: -3 %
FEV1FVC-%Pred-Pre: 105 %
FEV6-%Change-Post: 13 %
FEV6-%Pred-Post: 65 %
FEV6-%Pred-Pre: 57 %
FEV6-Post: 2.37 L
FEV6-Pre: 2.08 L
FEV6FVC-%Pred-Post: 102 %
FEV6FVC-%Pred-Pre: 102 %
FVC-%Change-Post: 13 %
FVC-%Pred-Post: 65 %
FVC-%Pred-Pre: 57 %
FVC-Post: 2.41 L
FVC-Pre: 2.13 L
Post FEV1/FVC ratio: 83 %
Post FEV6/FVC ratio: 100 %
Pre FEV1/FVC ratio: 86 %
Pre FEV6/FVC Ratio: 100 %
RV % pred: 104 %
RV: 1.74 L
TLC % pred: 82 %
TLC: 4.19 L

## 2022-08-13 NOTE — Progress Notes (Signed)
Full PFT performed today. °

## 2022-08-13 NOTE — Patient Instructions (Signed)
Full PFT performed today. °

## 2022-08-20 ENCOUNTER — Ambulatory Visit (HOSPITAL_BASED_OUTPATIENT_CLINIC_OR_DEPARTMENT_OTHER): Payer: BC Managed Care – PPO | Admitting: Cardiovascular Disease

## 2022-08-20 ENCOUNTER — Encounter (HOSPITAL_BASED_OUTPATIENT_CLINIC_OR_DEPARTMENT_OTHER): Payer: Self-pay

## 2022-08-20 DIAGNOSIS — R0683 Snoring: Secondary | ICD-10-CM

## 2022-08-20 DIAGNOSIS — Z79899 Other long term (current) drug therapy: Secondary | ICD-10-CM

## 2022-08-20 DIAGNOSIS — R002 Palpitations: Secondary | ICD-10-CM

## 2022-08-20 DIAGNOSIS — I471 Supraventricular tachycardia, unspecified: Secondary | ICD-10-CM

## 2022-09-03 ENCOUNTER — Telehealth: Payer: Self-pay

## 2022-09-03 NOTE — Telephone Encounter (Signed)
-----   Message from Fay Records, MD sent at 09/03/2022  3:19 PM EST ----- Monitor showed SR    Occasional PAC, PVC ON short burst of SVT   20 sec   Pt did not sense REcomm  Could try low dose of diltiazem 120mg  CD   This will help BP as well

## 2022-09-03 NOTE — Telephone Encounter (Signed)
Left a message for the pt to call back.  

## 2022-09-06 MED ORDER — DILTIAZEM HCL ER COATED BEADS 120 MG PO CP24: 120.0000 mg | ORAL_CAPSULE | Freq: Every day | ORAL | 3 refills | Status: DC

## 2022-09-06 NOTE — Telephone Encounter (Signed)
Pt verbalized understanding of her recommendations and will let us now how she is doing in a few weeks.

## 2022-09-22 ENCOUNTER — Ambulatory Visit (HOSPITAL_BASED_OUTPATIENT_CLINIC_OR_DEPARTMENT_OTHER): Payer: BC Managed Care – PPO | Attending: Internal Medicine | Admitting: Cardiovascular Disease

## 2023-12-22 ENCOUNTER — Encounter (HOSPITAL_COMMUNITY): Payer: Self-pay | Admitting: Neurology

## 2023-12-22 ENCOUNTER — Emergency Department (HOSPITAL_COMMUNITY)

## 2023-12-22 ENCOUNTER — Ambulatory Visit (HOSPITAL_COMMUNITY)
Admission: EM | Admit: 2023-12-22 | Discharge: 2023-12-22 | Disposition: A | Discharge status: Other Acute Inpatient Hospital | Attending: Family Medicine | Admitting: Family Medicine

## 2023-12-22 ENCOUNTER — Encounter (HOSPITAL_COMMUNITY): Payer: Self-pay | Admitting: *Deleted

## 2023-12-22 ENCOUNTER — Inpatient Hospital Stay (HOSPITAL_COMMUNITY)

## 2023-12-22 ENCOUNTER — Inpatient Hospital Stay (HOSPITAL_COMMUNITY)
Admission: EM | Admit: 2023-12-22 | Discharge: 2023-12-24 | DRG: 880 | Disposition: A | Discharge status: Home / Self Care | Source: Ambulatory Visit | Attending: Neurology | Admitting: Neurology

## 2023-12-22 ENCOUNTER — Other Ambulatory Visit: Payer: Self-pay

## 2023-12-22 DIAGNOSIS — Z86711 Personal history of pulmonary embolism: Secondary | ICD-10-CM | POA: Diagnosis not present

## 2023-12-22 DIAGNOSIS — I10 Essential (primary) hypertension: Secondary | ICD-10-CM | POA: Diagnosis present

## 2023-12-22 DIAGNOSIS — R471 Dysarthria and anarthria: Secondary | ICD-10-CM | POA: Diagnosis present

## 2023-12-22 DIAGNOSIS — E785 Hyperlipidemia, unspecified: Secondary | ICD-10-CM | POA: Diagnosis present

## 2023-12-22 DIAGNOSIS — R29704 NIHSS score 4: Secondary | ICD-10-CM | POA: Diagnosis present

## 2023-12-22 DIAGNOSIS — Q2112 Patent foramen ovale: Secondary | ICD-10-CM

## 2023-12-22 DIAGNOSIS — F444 Conversion disorder with motor symptom or deficit: Principal | ICD-10-CM | POA: Diagnosis present

## 2023-12-22 DIAGNOSIS — E876 Hypokalemia: Secondary | ICD-10-CM | POA: Diagnosis present

## 2023-12-22 DIAGNOSIS — F410 Panic disorder [episodic paroxysmal anxiety] without agoraphobia: Secondary | ICD-10-CM | POA: Diagnosis present

## 2023-12-22 DIAGNOSIS — I471 Supraventricular tachycardia, unspecified: Secondary | ICD-10-CM

## 2023-12-22 DIAGNOSIS — R739 Hyperglycemia, unspecified: Secondary | ICD-10-CM | POA: Diagnosis not present

## 2023-12-22 DIAGNOSIS — E039 Hypothyroidism, unspecified: Secondary | ICD-10-CM | POA: Diagnosis present

## 2023-12-22 DIAGNOSIS — I671 Cerebral aneurysm, nonruptured: Secondary | ICD-10-CM | POA: Diagnosis present

## 2023-12-22 DIAGNOSIS — Z8659 Personal history of other mental and behavioral disorders: Secondary | ICD-10-CM

## 2023-12-22 DIAGNOSIS — I639 Cerebral infarction, unspecified: Principal | ICD-10-CM | POA: Diagnosis present

## 2023-12-22 DIAGNOSIS — J45909 Unspecified asthma, uncomplicated: Secondary | ICD-10-CM | POA: Diagnosis present

## 2023-12-22 DIAGNOSIS — G935 Compression of brain: Secondary | ICD-10-CM | POA: Insufficient documentation

## 2023-12-22 DIAGNOSIS — I6389 Other cerebral infarction: Secondary | ICD-10-CM | POA: Diagnosis not present

## 2023-12-22 DIAGNOSIS — Z825 Family history of asthma and other chronic lower respiratory diseases: Secondary | ICD-10-CM | POA: Diagnosis not present

## 2023-12-22 DIAGNOSIS — Z79890 Hormone replacement therapy (postmenopausal): Secondary | ICD-10-CM | POA: Diagnosis not present

## 2023-12-22 DIAGNOSIS — R29810 Facial weakness: Secondary | ICD-10-CM | POA: Diagnosis present

## 2023-12-22 DIAGNOSIS — Z79899 Other long term (current) drug therapy: Secondary | ICD-10-CM

## 2023-12-22 DIAGNOSIS — R569 Unspecified convulsions: Secondary | ICD-10-CM | POA: Diagnosis not present

## 2023-12-22 DIAGNOSIS — G8194 Hemiplegia, unspecified affecting left nondominant side: Secondary | ICD-10-CM | POA: Diagnosis present

## 2023-12-22 DIAGNOSIS — R29703 NIHSS score 3: Secondary | ICD-10-CM | POA: Diagnosis not present

## 2023-12-22 DIAGNOSIS — Z9049 Acquired absence of other specified parts of digestive tract: Secondary | ICD-10-CM

## 2023-12-22 DIAGNOSIS — R29898 Other symptoms and signs involving the musculoskeletal system: Secondary | ICD-10-CM

## 2023-12-22 LAB — CBC
HCT: 33.2 % — ABNORMAL LOW (ref 36.0–46.0)
Hemoglobin: 10.1 g/dL — ABNORMAL LOW (ref 12.0–15.0)
MCH: 23.1 pg — ABNORMAL LOW (ref 26.0–34.0)
MCHC: 30.4 g/dL (ref 30.0–36.0)
MCV: 76 fL — ABNORMAL LOW (ref 80.0–100.0)
Platelets: 410 10*3/uL — ABNORMAL HIGH (ref 150–400)
RBC: 4.37 MIL/uL (ref 3.87–5.11)
RDW: 17.7 % — ABNORMAL HIGH (ref 11.5–15.5)
WBC: 8.9 10*3/uL (ref 4.0–10.5)
nRBC: 0 % (ref 0.0–0.2)

## 2023-12-22 LAB — COMPREHENSIVE METABOLIC PANEL WITH GFR
ALT: 16 U/L (ref 0–44)
AST: 22 U/L (ref 15–41)
Albumin: 4.1 g/dL (ref 3.5–5.0)
Alkaline Phosphatase: 52 U/L (ref 38–126)
Anion gap: 12 (ref 5–15)
BUN: 10 mg/dL (ref 6–20)
CO2: 20 mmol/L — ABNORMAL LOW (ref 22–32)
Calcium: 9.2 mg/dL (ref 8.9–10.3)
Chloride: 105 mmol/L (ref 98–111)
Creatinine, Ser: 0.85 mg/dL (ref 0.44–1.00)
GFR, Estimated: >60 mL/min (ref >60)
Glucose, Bld: 96 mg/dL (ref 70–99)
Potassium: 3.2 mmol/L — ABNORMAL LOW (ref 3.5–5.1)
Sodium: 137 mmol/L (ref 135–145)
Total Bilirubin: 1.3 mg/dL — ABNORMAL HIGH (ref 0.0–1.2)
Total Protein: 7.3 g/dL (ref 6.5–8.1)

## 2023-12-22 LAB — DIFFERENTIAL
Abs Immature Granulocytes: 0.04 10*3/uL (ref 0.00–0.07)
Basophils Absolute: 0.1 10*3/uL (ref 0.0–0.1)
Basophils Relative: 1 %
Eosinophils Absolute: 0.1 10*3/uL (ref 0.0–0.5)
Eosinophils Relative: 1 %
Immature Granulocytes: 0 %
Lymphocytes Relative: 22 %
Lymphs Abs: 2 10*3/uL (ref 0.7–4.0)
Monocytes Absolute: 0.8 10*3/uL (ref 0.1–1.0)
Monocytes Relative: 9 %
Neutro Abs: 6 10*3/uL (ref 1.7–7.7)
Neutrophils Relative %: 67 %

## 2023-12-22 LAB — MAGNESIUM: Magnesium: 1.9 mg/dL (ref 1.7–2.4)

## 2023-12-22 LAB — I-STAT CHEM 8, ED
BUN: 10 mg/dL (ref 6–20)
Calcium, Ion: 1.11 mmol/L — ABNORMAL LOW (ref 1.15–1.40)
Chloride: 106 mmol/L (ref 98–111)
Creatinine, Ser: 0.9 mg/dL (ref 0.44–1.00)
Glucose, Bld: 93 mg/dL (ref 70–99)
HCT: 33 % — ABNORMAL LOW (ref 36.0–46.0)
Hemoglobin: 11.2 g/dL — ABNORMAL LOW (ref 12.0–15.0)
Potassium: 3.3 mmol/L — ABNORMAL LOW (ref 3.5–5.1)
Sodium: 139 mmol/L (ref 135–145)
TCO2: 21 mmol/L — ABNORMAL LOW (ref 22–32)

## 2023-12-22 LAB — PROTIME-INR
INR: 1.1 (ref 0.8–1.2)
Prothrombin Time: 14.8 s (ref 11.4–15.2)

## 2023-12-22 LAB — HIV ANTIBODY (ROUTINE TESTING W REFLEX): HIV Screen 4th Generation wRfx: NONREACTIVE

## 2023-12-22 LAB — POCT FASTING CBG KUC MANUAL ENTRY: POCT Glucose (KUC): 104 mg/dL — AB (ref 70–99)

## 2023-12-22 LAB — HCG, SERUM, QUALITATIVE: Preg, Serum: NEGATIVE

## 2023-12-22 LAB — TROPONIN I (HIGH SENSITIVITY)
Troponin I (High Sensitivity): <2 ng/L (ref <18)
Troponin I (High Sensitivity): <2 ng/L (ref <18)

## 2023-12-22 LAB — HEMOGLOBIN A1C
Hgb A1c MFr Bld: 4.9 % (ref 4.8–5.6)
Mean Plasma Glucose: 93.93 mg/dL

## 2023-12-22 LAB — MRSA NEXT GEN BY PCR, NASAL: MRSA by PCR Next Gen: NOT DETECTED

## 2023-12-22 LAB — ETHANOL: Alcohol, Ethyl (B): <15 mg/dL (ref <15)

## 2023-12-22 LAB — APTT: aPTT: 25 s (ref 24–36)

## 2023-12-22 LAB — CBG MONITORING, ED: Glucose-Capillary: 107 mg/dL — ABNORMAL HIGH (ref 70–99)

## 2023-12-22 LAB — PHOSPHORUS: Phosphorus: 3.7 mg/dL (ref 2.5–4.6)

## 2023-12-22 MED ORDER — ACETAMINOPHEN 325 MG PO TABS
650.0000 mg | ORAL_TABLET | ORAL | Status: DC | PRN
  Administered 2023-12-22 – 2023-12-24 (×6): 650 mg via ORAL
  Filled 2023-12-22 (×6): qty 2

## 2023-12-22 MED ORDER — ACETAMINOPHEN 650 MG RE SUPP: 650.0000 mg | RECTAL | Status: DC | PRN

## 2023-12-22 MED ORDER — STROKE: EARLY STAGES OF RECOVERY BOOK
Freq: Once | Status: AC
  Filled 2023-12-22: qty 1

## 2023-12-22 MED ORDER — IOHEXOL 350 MG/ML SOLN
75.0000 mL | Freq: Once | INTRAVENOUS | Status: AC | PRN
  Administered 2023-12-22: 75 mL via INTRAVENOUS

## 2023-12-22 MED ORDER — POTASSIUM CHLORIDE 10 MEQ/100ML IV SOLN
10.0000 meq | INTRAVENOUS | Status: AC
  Administered 2023-12-22 (×4): 10 meq via INTRAVENOUS
  Filled 2023-12-22 (×4): qty 100

## 2023-12-22 MED ORDER — TENECTEPLASE FOR STROKE
23.8750 mg | PACK | Freq: Once | INTRAVENOUS | Status: AC
  Administered 2023-12-22: 23.875 mg via INTRAVENOUS
  Filled 2023-12-22: qty 10

## 2023-12-22 MED ORDER — ACETAMINOPHEN 160 MG/5ML PO SOLN: 650.0000 mg | ORAL | Status: DC | PRN

## 2023-12-22 MED ORDER — ORAL CARE MOUTH RINSE: 15.0000 mL | OROMUCOSAL | Status: DC | PRN

## 2023-12-22 MED ORDER — PANTOPRAZOLE SODIUM 40 MG IV SOLR
40.0000 mg | Freq: Every day | INTRAVENOUS | Status: DC
  Administered 2023-12-22: 40 mg via INTRAVENOUS
  Filled 2023-12-22: qty 10

## 2023-12-22 MED ORDER — SENNOSIDES-DOCUSATE SODIUM 8.6-50 MG PO TABS: 1.0000 | ORAL_TABLET | Freq: Every evening | ORAL | Status: DC | PRN

## 2023-12-22 MED ORDER — SODIUM CHLORIDE 0.9% FLUSH: 3.0000 mL | Freq: Once | INTRAVENOUS | Status: AC

## 2023-12-22 MED ORDER — SODIUM CHLORIDE 0.9 % IV SOLN: INTRAVENOUS | Status: DC

## 2023-12-22 MED ORDER — CHLORHEXIDINE GLUCONATE CLOTH 2 % EX PADS
6.0000 | MEDICATED_PAD | Freq: Every day | CUTANEOUS | Status: DC
  Administered 2023-12-22 – 2023-12-24 (×3): 6 via TOPICAL

## 2023-12-22 MED ORDER — CLEVIDIPINE BUTYRATE 0.5 MG/ML IV EMUL: 0.0000 mg/h | INTRAVENOUS | Status: DC

## 2023-12-22 NOTE — ED Triage Notes (Signed)
 PT came in by [facility] from [facility] as a Code Stroke. LKW 09:30.  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 Side weakness (arm/leg), numbness, and facial droop.   BP per [facility] was 124/76

## 2023-12-22 NOTE — ED Triage Notes (Signed)
 PT reports her sister had a stroke 5 years ago.

## 2023-12-22 NOTE — ED Notes (Signed)
 [facility] at bedside

## 2023-12-22 NOTE — ED Notes (Signed)
 Charge nurse notified of code stroke

## 2023-12-22 NOTE — Progress Notes (Signed)
 PHARMACIST CODE STROKE RESPONSE  Notified to mix TNK at 1106 by Dr. Doretta Gant TNK preparation completed at 1108  TNK dose = 23.875 mg IV over 5 seconds  Issues/delays encountered (if applicable): n/a  Kelsey Friedman 12/22/23 11:02 AM'

## 2023-12-22 NOTE — ED Notes (Signed)
 Notified [facility] of code stroke.  Code stroke called by dr hagler

## 2023-12-22 NOTE — H&P (Signed)
 NEUROLOGY H&P NOTE   Date of service: December 22, 2023 Patient Name: Kelsey Friedman MRN:  [id number] DOB:  [birth year] Chief Complaint: "Code Stroke"  History of Present Illness  Kelsey Friedman is a 43 y.o. female with hx of SVT, HTN, hypothyroidism. She reports that she woke up in her usual state of health this morning and went to work as usual. She works at a Primary school teacher school when she started noticing numbness on the left side of her face at 0930 and left upper extremity weakness. She went to Urgent Care who then activated a code stroke. On arrival to Urgent Care she noted left facial tingling and a possible facial asymmetry on the right. On arrival to the bridge she has drift in both her left arm and leg, left sensory deficit in left face, arm, and leg and a left nasolabial flattening. She does endorse a headache as well. She denies history of migraines, she will get occasional headaches when she doesn't eat or drink normally. She does have a history of SVT and follows with Dr. Avanell Bob at [facility]. She is not on an antiplatelet agent. Heart rate well controlled on cardizem .   Last known well: 0930 Modified rankin score: 0-Completely asymptomatic and back to baseline post- stroke IV Thrombolysis: Yes Management with thrombolytic therapy was explained to the patient, or patient's representative, as were risks, benefits and alternatives. All questions were answered. Patient, or patient's representative, expressed understanding of the treatment plan and agreed to proceed with thrombolytic treatment. CTH reviewed prior to TNK administration   NIHSS components Score: Comment  1a Level of Conscious 0[x]  1[]  2[]  3[]      1b LOC Questions 0[x]  1[]  2[]       1c LOC Commands 0[x]  1[]  2[]       2 Best Gaze 0[x]  1[]  2[]       3 Visual 0[x]  1[]  2[]  3[]      4 Facial Palsy 0[]  1[x]  2[]  3[]      5a Motor Arm - left 0[]  1[x]  2[]  3[]  4[]  UN[]    5b Motor Arm - Right 0[x]  1[]  2[]  3[]  4[]  UN[]    6a Motor Leg -  Left 0[]  1[x]  2[]  3[]  4[]  UN[]    6b Motor Leg - Right 0[x]  1[]  2[]  3[]  4[]  UN[]    7 Limb Ataxia 0[x]  1[]  2[]  3[]  UN[]     8 Sensory 0[]  1[x]  2[]  UN[]      9 Best Language 0[x]  1[]  2[]  3[]      10 Dysarthria 0[x]  1[]  2[]  UN[]      11 Extinct. and Inattention 0[x]  1[]  2[]       TOTAL:4       ROS  Comprehensive ROS performed and pertinent positives documented in the HPI  Past History   Past Medical History:  Diagnosis Date   Asthma    PE (pulmonary embolism)    SOB (shortness of breath)    Past Surgical History:  Procedure Laterality Date   CHOLECYSTECTOMY     TONSILLECTOMY     Family History  Problem Relation Age of Onset   Emphysema Maternal Uncle    Allergies Daughter    Cancer Sister        hodgkin   Allergic rhinitis Neg Hx    Angioedema Neg Hx    Asthma Neg Hx    Eczema Neg Hx    Urticaria Neg Hx    Social History   Socioeconomic History   Marital status: Married    Spouse name: Not on file   Number  of children: Not on file   Years of education: Not on file   Highest education level: Not on file  Occupational History   Not on file  Tobacco Use   Smoking status: Never   Smokeless tobacco: Never  Substance and Sexual Activity   Alcohol use: Yes    Comment: socially   Drug use: No   Sexual activity: Not on file  Other Topics Concern   Not on file  Social History Narrative   Not on file   Social Drivers of Health   Financial Resource Strain: Not on file  Food Insecurity: Not on file  Transportation Needs: Not on file  Physical Activity: Not on file  Stress: Not on file  Social Connections: Not on file   No Known Allergies  Medications   Current Facility-Administered Medications:    [START ON 12/23/2023]  stroke: early stages of recovery book, , Does not apply, Once, Imogene Mana, NP   0.9 %  sodium chloride  infusion, , Intravenous, Continuous, Shafer, Devon, NP   acetaminophen  (TYLENOL ) tablet 650 mg, 650 mg, Oral, Q4H PRN **OR** acetaminophen   (TYLENOL ) 160 MG/5ML solution 650 mg, 650 mg, Per Tube, Q4H PRN **OR** acetaminophen  (TYLENOL ) suppository 650 mg, 650 mg, Rectal, Q4H PRN, Dela Favor, Devon, NP   clevidipine  (CLEVIPREX ) infusion 0.5 mg/mL, 0-21 mg/hr, Intravenous, Continuous, Shafer, Devon, NP   pantoprazole  (PROTONIX ) injection 40 mg, 40 mg, Intravenous, Daily, Shafer, Devon, NP   senna-docusate (Senokot-S) tablet 1 tablet, 1 tablet, Oral, QHS PRN, Imogene Mana, NP   sodium chloride  flush (NS) 0.9 % injection 3 mL, 3 mL, Intravenous, Once, Trish Furl, MD  Current Outpatient Medications:    albuterol  (PROAIR  HFA) 108 (90 Base) MCG/ACT inhaler, Inhale 2 puffs into the lungs every 4 (four) hours as needed for wheezing or shortness of breath., Disp: 1 Inhaler, Rfl: 1   ALPRAZolam (XANAX) 0.25 MG tablet, Take 0.25 mg by mouth as needed for anxiety., Disp: , Rfl:    cetirizine (ZYRTEC) 10 MG tablet, Take 10 mg by mouth daily., Disp: , Rfl:    diltiazem  (CARDIZEM  CD) 120 MG 24 hr capsule, Take 1 capsule (120 mg total) by mouth daily., Disp: 90 capsule, Rfl: 3   diphenhydrAMINE (BENADRYL) 25 mg capsule, Take 25 mg by mouth every 6 (six) hours as needed., Disp: , Rfl:    escitalopram  (LEXAPRO ) 20 MG tablet, Take 20 mg by mouth daily., Disp: , Rfl:    Fluticasone  Furoate (ARNUITY ELLIPTA ) 100 MCG/ACT AEPB, Inhale 1 puff into the lungs daily., Disp: 30 each, Rfl: 3   levothyroxine  (SYNTHROID ) 75 MCG tablet, Take 75 mcg by mouth every morning., Disp: , Rfl:    losartan-hydrochlorothiazide (HYZAAR) 50-12.5 MG tablet, Take 1 tablet by mouth daily., Disp: , Rfl:    meloxicam (MOBIC) 15 MG tablet, Take 15 mg by mouth daily., Disp: , Rfl:    Multiple Vitamin (MULTIVITAMIN) tablet, Take 1 tablet by mouth daily., Disp: , Rfl:    zolpidem (AMBIEN CR) 12.5 MG CR tablet, Take 12.5 mg by mouth at bedtime as needed for sleep., Disp: , Rfl:    Vitals   Vitals:   12/22/23 1100 12/22/23 1108 12/22/23 1123 12/22/23 1130  BP: (!) 129/91 109/84 116/89    Pulse: 84 81 86   Resp:  18 16   Temp:    99.4 F (37.4 C)  TempSrc:    Oral  SpO2:  100% 100%      There is no height or weight on file to  calculate BMI.  Physical Exam   Constitutional: Appears well-developed and well-nourished.  Psych: Anxious, but appropriate to situation.  Eyes: No scleral injection.  HENT: No OP obstruction.  Head: Normocephalic.  Cardiovascular: Normal rate and regular rhythm.  Respiratory: Effort normal, non-labored breathing.  GI: Soft.  No distension. There is no tenderness.  Skin: WDI.   Neurologic Examination   Neuro: Mental Status: Patient is awake, alert, oriented to person, place, month, year, and situation. Patient is able to give a clear and coherent history. No signs of aphasia or neglect Cranial Nerves: II: Visual Fields are full. Pupils are equal, round, and reactive to light.   III,IV, VI: EOMI without ptosis or diploplia.  V: Facial sensation diminished on the left, endorses tingling sensation as well  VII: left nasolabial fold flattening  VIII: Hearing is intact to voice X: Palate elevates symmetrically, voice is soft XI: Shoulder shrug is symmetric. XII: Tongue protrudes midline without atrophy or fasciculations.  Motor: Tone is normal. Bulk is normal.  LUE and LLE 4/5 with mild drift RUE and RLE full strength  Sensory: Sensation is diminished to light touch in left arm and leg.  Cerebellar: FNF and HKS are intact on the right, no ataxia out of proportion to weakness on the left.   Labs   CBC:  Recent Labs  Lab 12/22/23 1050 12/22/23 1054  WBC 8.9  --   NEUTROABS 6.0  --   HGB 10.1* 11.2*  HCT 33.2* 33.0*  MCV 76.0*  --   PLT 410*  --    Basic Metabolic Panel:  Lab Results  Component Value Date   NA 139 12/22/2023   K 3.3 (L) 12/22/2023   CO2 20 (L) 12/22/2023   GLUCOSE 93 12/22/2023   BUN 10 12/22/2023   CREATININE 0.90 12/22/2023   CALCIUM  9.2 12/22/2023   GFRNONAA >60 12/22/2023   GFRAA >90  03/04/2014   Lipid Panel: No results found for: "LDLCALC" HgbA1c: No results found for: "HGBA1C" Urine Drug Screen: No results found for: "LABOPIA", "COCAINSCRNUR", "LABBENZ", "AMPHETMU", "THCU", "LABBARB"  Alcohol Level     Component Value Date/Time   ETH <15 12/22/2023 1050   INR  Lab Results  Component Value Date   INR 1.1 12/22/2023   APTT  Lab Results  Component Value Date   APTT 25 12/22/2023     CT Head without contrast(Personally reviewed): No evidence of an acute intracranial abnormality. Partially empty sella turcica. This finding can reflect incidental anatomic variation, or alternatively, it can be associated with chronic idiopathic intracranial hypertension (pseudotumor cerebri). 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  Mild paranasal sinus disease, as described.  CT angio Head and Neck with contrast(Personally reviewed): pending  Assessment   Kelsey Friedman is a 43 y.o. female presenting from urgent care with left sided weakness and numbness. She was determined to be candidate for TNK. TNK given after review of imaging and consent obtained from patient. Admit to ICU for further monitoring and stroke work up.   Primary Diagnosis:  Suspect right hemispheric stroke determined by clinical assessment   Secondary Diagnosis: Essential (primary) hypertension and Hypokalemia  Plan: Acuity: Acute -Admit to: ICU -Hold Aspirin  until 24 hour post tPA neuroimaging is stable and without evidence of bleeding -Blood pressure control, goal of SYS < 180 -MRI/ECHO/A1C/Lipid panel. -Hyperglycemia management per SSI to maintain glucose 140-180mg /dL. 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   ENDO -SSI -goal HgbA1c < 7%  Fluid/Electrolyte Disorders Hypokalemia - Kcl -Repeat labs  -Phos and Mag pending   ID -NPO until swallow screen -Monitor   Prophylaxis DVT:  SCDs GI: PPI Bowel: Docusate / Senna  Diet: NPO until cleared by speech  Code Status: Full Code    Patient seen and examined by NP/APP with MD. MD to update note as needed.   Imogene Mana, DNP, FNP-BC [facility] Pager: [phone]   Attending Neurohospitalist Addendum Patient seen and examined with APP/Resident. Agree with the history and physical as documented above. Agree with the plan as documented, which I helped formulate. I have edited the note above to reflect my full findings and recommendations. I have independently reviewed the chart, obtained history, review of systems and examined the patient.I have personally reviewed pertinent head/neck/spine imaging (CT/MRI). Please feel free to call with any questions.  This patient is critically ill and at significant risk of neurological worsening, death and care requires constant monitoring of vital signs, hemodynamics,respiratory and cardiac monitoring, neurological assessment, discussion with family, other specialists and medical decision making of high complexity. I spent 70 minutes of neurocritical care time  in the care of  this patient. This was time spent independent of any time provided by nurse practitioner or PA.  Greg Leaks, MD [facility] [phone]  If 7pm- 7am, please page neurology on call as listed in AMION.

## 2023-12-22 NOTE — ED Provider Notes (Signed)
 [place] EMERGENCY DEPARTMENT AT [facility] Provider Note   CSN: [id number] Arrival date & time: 12/22/23  1046  An emergency department physician performed an initial assessment on this suspected stroke patient at 1050.  History {Add pertinent medical, surgical, social history, OB history to HPI:1} Chief Complaint  Patient presents with  . Code Stroke   HPI Kelsey Friedman is a 43 y.o. female with history of PE, asthma presenting for code stroke.  Last known well 9:30 AM this morning.  She states she started to feel some left sided chest tightness followed by felt numbness in her left arm as well as her left face this morning.  She urgent care and there was immediate concern for stroke.  Urgent care provider did note concern for marked left arm weakness on exam.  He activated code stroke from the urgent care and she has immediately transferred here by EMS.  Was evaluated by neurology upon arrival. HPI     Home Medications Prior to Admission medications   Medication Sig Start Date End Date Taking? Authorizing Provider  albuterol  (PROAIR  HFA) 108 (90 Base) MCG/ACT inhaler Inhale 2 puffs into the lungs every 4 (four) hours as needed for wheezing or shortness of breath. 06/16/17   Ambs, Jeanmarie Millet, FNP  ALPRAZolam (XANAX) 0.25 MG tablet Take 0.25 mg by mouth as needed for anxiety. 06/03/22   [provider]  cetirizine (ZYRTEC) 10 MG tablet Take 10 mg by mouth daily.    [provider]  diltiazem  (CARDIZEM  CD) 120 MG 24 hr capsule Take 1 capsule (120 mg total) by mouth daily. 09/06/22   Elmyra Haggard, MD  diphenhydrAMINE (BENADRYL) 25 mg capsule Take 25 mg by mouth every 6 (six) hours as needed.    [provider]  escitalopram  (LEXAPRO ) 20 MG tablet Take 20 mg by mouth daily. 06/06/22   [provider]  Fluticasone  Furoate (ARNUITY ELLIPTA ) 100 MCG/ACT AEPB Inhale 1 puff into the lungs daily. 06/16/17   Ardie Kras, FNP  levothyroxine  (SYNTHROID )  75 MCG tablet Take 75 mcg by mouth every morning. 05/10/22   [provider]  losartan-hydrochlorothiazide (HYZAAR) 50-12.5 MG tablet Take 1 tablet by mouth daily. 05/05/22   [provider]  meloxicam (MOBIC) 15 MG tablet Take 15 mg by mouth daily. 06/03/22   [provider]  Multiple Vitamin (MULTIVITAMIN) tablet Take 1 tablet by mouth daily.    [provider]  zolpidem (AMBIEN CR) 12.5 MG CR tablet Take 12.5 mg by mouth at bedtime as needed for sleep. 06/30/22   [provider]      Allergies    Patient has no known allergies.    Review of Systems   See HPI  Physical Exam Updated Vital Signs BP 116/89   Pulse 86   Resp 16   SpO2 100%  Physical Exam  ED Results / Procedures / Treatments   Labs (all labs ordered are listed, but only abnormal results are displayed) Labs Reviewed  CBC - Abnormal; Notable for the following components:      Result Value   Hemoglobin 10.1 (*)    HCT 33.2 (*)    MCV 76.0 (*)    MCH 23.1 (*)    RDW 17.7 (*)    Platelets 410 (*)    All other components within normal limits  COMPREHENSIVE METABOLIC PANEL WITH GFR - Abnormal; Notable for the following components:   Potassium 3.2 (*)    CO2 20 (*)  Total Bilirubin 1.3 (*)    All other components within normal limits  I-STAT CHEM 8, ED - Abnormal; Notable for the following components:   Potassium 3.3 (*)    Calcium , Ion 1.11 (*)    TCO2 21 (*)    Hemoglobin 11.2 (*)    HCT 33.0 (*)    All other components within normal limits  CBG MONITORING, ED - Abnormal; Notable for the following components:   Glucose-Capillary 107 (*)    All other components within normal limits  PROTIME-INR  APTT  DIFFERENTIAL  ETHANOL  HCG, SERUM, QUALITATIVE  HIV ANTIBODY (ROUTINE TESTING W REFLEX)  HEMOGLOBIN A1C    EKG None  Radiology CT HEAD CODE STROKE WO CONTRAST Result Date: 12/22/2023 CLINICAL DATA:  Code stroke. Provided history: Neuro deficit, acute, stroke  suspected. Left-sided weakness and numbness. EXAM: CT HEAD WITHOUT CONTRAST TECHNIQUE: Contiguous axial images were obtained from the base of the skull through the vertex without intravenous contrast. RADIATION DOSE REDUCTION: This exam was performed according to the departmental dose-optimization program which includes automated exposure control, adjustment of the mA and/or kV according to patient size and/or use of iterative reconstruction technique. COMPARISON:  None. FINDINGS: Brain: Cerebral volume is normal. Partially empty sella turcica. 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 There is no acute intracranial hemorrhage. No demarcated cortical infarct. No extra-axial fluid collection. No evidence of an intracranial mass. No midline shift. Vascular: No hyperdense vessel. Skull: No calvarial fracture or aggressive osseous lesion. Sinuses/Orbits: No mass or acute finding within the imaged orbits. Mucosal thickening within the bilateral maxillary sinuses (mild right, minimal left). Small-volume secretions within the right sphenoid sinus. ASPECTS [facility] Stroke Program Early CT Score) - Ganglionic level infarction (caudate, lentiform nuclei, internal capsule, insula, M1-M3 cortex): 7 - Supraganglionic infarction (M4-M6 cortex): 3 Total score (0-10 with 10 being normal): 10 No evidence of an acute intracranial abnormality. These results were communicated to Dr. Doretta Gant at 11:14 amon 4/24/2025by text page via the [facility] messaging system. IMPRESSION: 1.  No evidence of an acute intracranial abnormality. 2. Partially empty sella turcica. This finding can reflect incidental anatomic variation, or alternatively, it can be associated with chronic idiopathic intracranial hypertension (pseudotumor cerebri). 3. 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 4. Mild paranasal sinus  disease, as described. Electronically Signed   By: Bascom Lily D.O.   On: 12/22/2023 11:14    Procedures .Critical Care  Performed by: Janalee Mcmurray, PA-C Authorized by: Janalee Mcmurray, PA-C   Critical care provider statement:    Critical care time (minutes):  30   Critical care was necessary to treat or prevent imminent or life-threatening deterioration of the following conditions: stroke.   Critical care was time spent personally by me on the following activities:  Development of treatment plan with patient or surrogate, discussions with consultants, evaluation of patient's response to treatment, examination of patient, ordering and review of laboratory studies, ordering and review of radiographic studies, ordering and performing treatments and interventions, pulse oximetry, re-evaluation of patient's condition and review of old charts   {Document cardiac monitor, telemetry assessment procedure when appropriate:1}  Medications Ordered in ED Medications  sodium chloride  flush (NS) 0.9 % injection 3 mL (has no administration in time range)   stroke: early stages of recovery book (has no administration in time range)  0.9 %  sodium chloride  infusion (  has no administration in time range)  acetaminophen  (TYLENOL ) tablet 650 mg (has no administration in time range)    Or  acetaminophen  (TYLENOL ) 160 MG/5ML solution 650 mg (has no administration in time range)    Or  acetaminophen  (TYLENOL ) suppository 650 mg (has no administration in time range)  senna-docusate (Senokot-S) tablet 1 tablet (has no administration in time range)  pantoprazole  (PROTONIX ) injection 40 mg (has no administration in time range)  clevidipine  (CLEVIPREX ) infusion 0.5 mg/mL (has no administration in time range)    ED Course/ Medical Decision Making/ A&P   {   Click here for ABCD2, HEART and other calculatorsREFRESH Note before signing :1}                              Medical Decision Making Amount and/or  Complexity of Data Reviewed Labs: ordered. Radiology: ordered.  Risk Decision regarding hospitalization.   ***  {Document critical care time when appropriate:1} {Document review of labs and clinical decision tools ie heart score, Chads2Vasc2 etc:1}  {Document your independent review of radiology images, and any outside records:1} {Document your discussion with family members, caretakers, and with consultants:1} {Document social determinants of health affecting pt's care:1} {Document your decision making why or why not admission, treatments were needed:1} Final Clinical Impression(s) / ED Diagnoses Final diagnoses:  None    Rx / DC Orders ED Discharge Orders     None

## 2023-12-22 NOTE — Progress Notes (Signed)
 PT Cancellation Note  Patient Details Name: Kelsey Friedman MRN: [id number] DOB: [birth year]   Cancelled Treatment:    Reason Eval/Treat Not Completed: Active bedrest order. Pt received TNK earlier today. Will plan to hold off until > 24 hours post TNK administration per protocol unless specified otherwise by MD.   Vernida Goodie, PT, DPT Acute Rehabilitation Services  Office: [phone]    Ellyn Hack 12/22/2023, 3:00 PM

## 2023-12-22 NOTE — ED Provider Notes (Signed)
 [facility]ARE [facility]ENTER   [id number] 12/22/23 Arrival Time: 1016  ASSESSMENT & PLAN:  1. Left arm weakness    She does have significantly decreased grip strength on the left. Having trouble describing facial symptoms. Husband does confirm that her left facial features look odd compared to her normal self. All symptoms started < 1 hour ago. Will be cautious here and call EMS and code stroke based on her initial complaint of acutely decreased strength and sensation changes on left arm. On exam, I think she has a slight R sided facial droop even though she is complaining of L-sided facial tingling. No h/o similar.  Labs Reviewed  POCT FASTING CBG [facility] MANUAL ENTRY - Abnormal; Notable for the following components:      Result Value   POCT Glucose ([facility]) 104 (*)    All other components within normal limits   BP (!) 146/110   Pulse 87   Resp 18   SpO2 100%   Reviewed expectations re: course of current medical issues. Questions answered. Outlined signs and symptoms indicating need for more acute intervention. Patient verbalized understanding. After Visit Summary given.   SUBJECTIVE: History from: Patient and husband. Patient is able to give a clear and coherent history.  Kelsey Friedman is a 43 y.o. female who presents with her husband. Reports being at work and acutely noting that her L upper chest "felt funny" followed by slowly progressing L arm weakness and numbness. Worried her to the point of calling the tele-nurse who told her to go to hospital. Since then has noted L-sided facial tingling. Denies speech/vision/hearing changes. Ambulatory here. Denies n/v.  OBJECTIVE:  Vitals:   12/22/23 1020  BP: (!) 146/110  Pulse: 87  Resp: 18  SpO2: 100%    General appearance: alert; tearful but able to communicate well HENT: normocephalic; atraumatic Eyes: both pupils with minimal light reactivity but equal; EOMI; conjunctivae normal Neck: supple with FROM Lungs: clear to  auscultation bilaterally; unlabored respirations; CTAB Heart: regular Skin: warm and dry Neurologic: alert; speech is fluent and clear without dysarthria or aphasia; to me it appears that the right side of her face has the slightest facial droop; normal gait; normal symmetric reflexes; with decreased grip strength on the left Psychological: alert and cooperative; normal mood and affect  Labs: Results for orders placed or performed during the hospital encounter of 12/22/23  POCT fasting CBG (manual entry)   Collection Time: 12/22/23 10:26 AM  Result Value Ref Range   POCT Glucose ([facility]) 104 (A) 70 - 99 mg/dL   Labs Reviewed  POCT FASTING CBG [facility] MANUAL ENTRY - Abnormal; Notable for the following components:      Result Value   POCT Glucose ([facility]) 104 (*)    All other components within normal limits    Imaging: No results found.  No Known Allergies  Past Medical History:  Diagnosis Date   Asthma    PE (pulmonary embolism)    SOB (shortness of breath)    Social History   Socioeconomic History   Marital status: Married    Spouse name: Not on file   Number of children: Not on file   Years of education: Not on file   Highest education level: Not on file  Occupational History   Not on file  Tobacco Use   Smoking status: Never   Smokeless tobacco: Never  Substance and Sexual Activity   Alcohol use: Yes    Comment: socially   Drug use: No  Sexual activity: Not on file  Other Topics Concern   Not on file  Social History Narrative   Not on file   Social Drivers of Health   Financial Resource Strain: Not on file  Food Insecurity: Not on file  Transportation Needs: Not on file  Physical Activity: Not on file  Stress: Not on file  Social Connections: Not on file  Intimate Partner Violence: Not on file   Family History  Problem Relation Age of Onset   Emphysema Maternal Uncle    Allergies Daughter    Cancer Sister        hodgkin   Allergic rhinitis Neg Hx     Angioedema Neg Hx    Asthma Neg Hx    Eczema Neg Hx    Urticaria Neg Hx    Past Surgical History:  Procedure Laterality Date   CHOLECYSTECTOMY     TONSILLECTOMY        Afton Albright, MD 12/22/23 1049

## 2023-12-22 NOTE — ED Notes (Signed)
 EDP Hagler at bed side

## 2023-12-22 NOTE — ED Triage Notes (Signed)
 PT reports Sx's started at 0930. Pt has numbness on Lt side of face. RT sided facial droop.

## 2023-12-22 NOTE — Code Documentation (Signed)
 Stroke Response Nurse Documentation Code Documentation  Kelsey Friedman is a 43 y.o. female arriving to [facility]  via [facility] on 12/22/23 with past medical hx of asthma, PE and SOB. On No antithrombotic. Code stroke was activated by EMS.   Patient from urgent care where she was LKW at 0930 and now complaining of left sided numbness and weakness. Pt woke up feeling well this morning. 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    Stroke team at the bedside on patient arrival. Labs drawn and patient cleared for CT . Patient to CT with team. NIHSS 4, see documentation for details and code stroke times. The following imaging was completed:  CT Head. Patient is a candidate for IV Thrombolytic- given at 1108. Patient is not a candidate for IR due to no LVO suspected.   Care Plan: q64min vitals and NIH x2hr then q90min x6hrs then q1h x16 hrs.   BP goal <180/105    Bedside handoff with ED RN Josh.    Pat Bonier  Stroke Response RN

## 2023-12-23 ENCOUNTER — Inpatient Hospital Stay (HOSPITAL_COMMUNITY)

## 2023-12-23 DIAGNOSIS — I6389 Other cerebral infarction: Secondary | ICD-10-CM

## 2023-12-23 DIAGNOSIS — G935 Compression of brain: Secondary | ICD-10-CM

## 2023-12-23 DIAGNOSIS — E785 Hyperlipidemia, unspecified: Secondary | ICD-10-CM

## 2023-12-23 DIAGNOSIS — R569 Unspecified convulsions: Secondary | ICD-10-CM | POA: Diagnosis not present

## 2023-12-23 DIAGNOSIS — Q2112 Patent foramen ovale: Secondary | ICD-10-CM

## 2023-12-23 DIAGNOSIS — R29704 NIHSS score 4: Secondary | ICD-10-CM | POA: Diagnosis not present

## 2023-12-23 DIAGNOSIS — I639 Cerebral infarction, unspecified: Secondary | ICD-10-CM | POA: Diagnosis not present

## 2023-12-23 LAB — ECHOCARDIOGRAM COMPLETE BUBBLE STUDY
Area-P 1/2: 4.49 cm2
Calc EF: 58.4 %
Height: 64 in
S' Lateral: 2.8 cm
Single Plane A2C EF: 56.6 %
Single Plane A4C EF: 62.9 %
Weight: 2528 [oz_av]

## 2023-12-23 LAB — LIPID PANEL
Cholesterol: 163 mg/dL (ref 0–200)
HDL: 45 mg/dL (ref >40)
LDL Cholesterol: 103 mg/dL — ABNORMAL HIGH (ref 0–99)
Total CHOL/HDL Ratio: 3.6 ratio
Triglycerides: 75 mg/dL (ref <150)
VLDL: 15 mg/dL (ref 0–40)

## 2023-12-23 MED ORDER — LEVOTHYROXINE SODIUM 75 MCG PO TABS
75.0000 ug | ORAL_TABLET | Freq: Every day | ORAL | Status: DC
Start: 2023-12-23 — End: 2023-12-24
  Administered 2023-12-23 – 2023-12-24 (×2): 75 ug via ORAL
  Filled 2023-12-23 (×2): qty 1

## 2023-12-23 MED ORDER — DILTIAZEM HCL ER COATED BEADS 120 MG PO CP24
120.0000 mg | ORAL_CAPSULE | Freq: Every day | ORAL | Status: DC
  Administered 2023-12-23 – 2023-12-24 (×2): 120 mg via ORAL
  Filled 2023-12-23 (×2): qty 1

## 2023-12-23 MED ORDER — ESCITALOPRAM OXALATE 10 MG PO TABS
20.0000 mg | ORAL_TABLET | Freq: Every day | ORAL | Status: DC
  Administered 2023-12-23 – 2023-12-24 (×2): 20 mg via ORAL
  Filled 2023-12-23 (×2): qty 2

## 2023-12-23 MED ORDER — ATORVASTATIN CALCIUM 40 MG PO TABS
40.0000 mg | ORAL_TABLET | Freq: Every day | ORAL | Status: DC
  Administered 2023-12-23 – 2023-12-24 (×2): 40 mg via ORAL
  Filled 2023-12-23 (×2): qty 1

## 2023-12-23 MED ORDER — LABETALOL HCL 5 MG/ML IV SOLN: 10.0000 mg | INTRAVENOUS | Status: DC | PRN

## 2023-12-23 MED ORDER — HYDRALAZINE HCL 20 MG/ML IJ SOLN: 10.0000 mg | Freq: Four times a day (QID) | INTRAMUSCULAR | Status: DC | PRN

## 2023-12-23 NOTE — Procedures (Signed)
 Patient Name: Kelsey Friedman  MRN: [id number]  Epilepsy Attending: Arleene Lack  Referring Physician/Provider: Colon Dear, NP  Date: 12/23/2023 Duration: 23.32 mins  Patient history: 43yo F with left-sided weakness, left facial droop and left-sided sensory deficit. EEG to evaluate for seizure  Level of alertness: Awake  AEDs during EEG study: None  Technical aspects: This EEG study was done with scalp electrodes positioned according to the 10-20 International system of electrode placement. Electrical activity was reviewed with band pass filter of 1-70Hz , sensitivity of 7 uV/mm, display speed of 34mm/sec with a 60Hz  notched filter applied as appropriate. EEG data were recorded continuously and digitally stored.  Video monitoring was available and reviewed as appropriate.  Description: No posterior dominant rhythm was seen. There is 12-13hz  beta activity distributed symmetrically and diffusely.  Hyperventilation and photic stimulation were not performed.     IMPRESSION: This study is within normal limits. No seizures or epileptiform discharges were seen throughout the recording.  A normal interictal EEG does not exclude the diagnosis of epilepsy.  Brydan Downard O Arta Stump

## 2023-12-23 NOTE — Progress Notes (Signed)
  Echocardiogram 2D Echocardiogram has been performed.  Kelsey Friedman 12/23/2023, 2:25 PM

## 2023-12-23 NOTE — Progress Notes (Signed)
 2D echo attempted, patient in MRI. Will try later.

## 2023-12-23 NOTE — Progress Notes (Addendum)
 STROKE TEAM PROGRESS NOTE    SIGNIFICANT HOSPITAL EVENTS 4/24: Patient admitted with left-sided weakness and numbness as well as left facial droop and given TNK 4/25: Patient transferred out of the ICU  INTERIM HISTORY/SUBJECTIVE  Patient has been afebrile and hemodynamically stable overnight.  She continues to have some moderate word finding difficulties and slowed speech, but MRI was negative for acute infarct.  OBJECTIVE  CBC    Component Value Date/Time   WBC 8.9 12/22/2023 1050   RBC 4.37 12/22/2023 1050   HGB 11.2 (L) 12/22/2023 1054   HGB 14.1 06/16/2017 1047   HCT 33.0 (L) 12/22/2023 1054   HCT 41.9 06/16/2017 1047   PLT 410 (H) 12/22/2023 1050   PLT 277 06/16/2017 1047   MCV 76.0 (L) 12/22/2023 1050   MCV 90 06/16/2017 1047   MCH 23.1 (L) 12/22/2023 1050   MCHC 30.4 12/22/2023 1050   RDW 17.7 (H) 12/22/2023 1050   RDW 14.0 06/16/2017 1047   LYMPHSABS 2.0 12/22/2023 1050   LYMPHSABS 1.9 06/16/2017 1047   MONOABS 0.8 12/22/2023 1050   EOSABS 0.1 12/22/2023 1050   EOSABS 0.1 06/16/2017 1047   BASOSABS 0.1 12/22/2023 1050   BASOSABS 0.0 06/16/2017 1047    BMET    Component Value Date/Time   NA 139 12/22/2023 1054   K 3.3 (L) 12/22/2023 1054   CL 106 12/22/2023 1054   CO2 20 (L) 12/22/2023 1050   GLUCOSE 93 12/22/2023 1054   BUN 10 12/22/2023 1054   CREATININE 0.90 12/22/2023 1054   CALCIUM  9.2 12/22/2023 1050   GFRNONAA >60 12/22/2023 1050    IMAGING past 24 hours ECHOCARDIOGRAM COMPLETE BUBBLE STUDY Result Date: 12/23/2023    ECHOCARDIOGRAM REPORT   Patient Name:   HAGEN TIDD [facility] Date of Exam: 12/23/2023 Medical Rec #:  [id number]        Height:       64.0 in Accession #:    [id number]       Weight:       158.0 lb Date of Birth:  06/15/1981        BSA:          1.770 m Patient Age:    43 years         BP:           108/85 mmHg Patient Gender: F                HR:           84 bpm. Exam Location:  Inpatient Procedure: 2D Echo, Cardiac Doppler, Color Doppler  and Saline Contrast Bubble            Study (Both Spectral and Color Flow Doppler were utilized during            procedure). Indications:    Stroke  History:        Patient has prior history of Echocardiogram examinations, most                 recent 02/18/2010. Stroke, Signs/Symptoms:Dyspnea and Shortness                 of Breath; Risk Factors:Hypertension. Pulmonary embolus.  Sonographer:    Raynelle Callow RDCS Referring Phys: [id number] DEVON SHAFER IMPRESSIONS  1. Left ventricular ejection fraction, by estimation, is 60 to 65%. The left ventricle has normal function. The left ventricle has no regional wall motion abnormalities. Left ventricular diastolic parameters were normal.  2. Right ventricular systolic function is  normal. The right ventricular size is normal. Tricuspid regurgitation signal is inadequate for assessing PA pressure.  3. The mitral valve is normal in structure. Trivial mitral valve regurgitation. No evidence of mitral stenosis.  4. The aortic valve is normal in structure. Aortic valve regurgitation is not visualized. No aortic stenosis is present.  5. Aortic dilatation noted. There is borderline dilatation of the ascending aorta, measuring 39 mm.  6. The inferior vena cava is normal in size with greater than 50% respiratory variability, suggesting right atrial pressure of 3 mmHg.  7. Evidence of atrial level shunting detected by color flow Doppler. Agitated saline contrast bubble study was positive with shunting observed within 3-6 cardiac cycles suggestive of interatrial shunt. FINDINGS  Left Ventricle: Left ventricular ejection fraction, by estimation, is 60 to 65%. The left ventricle has normal function. The left ventricle has no regional wall motion abnormalities. The left ventricular internal cavity size was normal in size. There is  no left ventricular hypertrophy. Left ventricular diastolic parameters were normal. Right Ventricle: The right ventricular size is normal. No increase in right  ventricular wall thickness. Right ventricular systolic function is normal. Tricuspid regurgitation signal is inadequate for assessing PA pressure. Left Atrium: Left atrial size was normal in size. Right Atrium: Right atrial size was normal in size. Pericardium: There is no evidence of pericardial effusion. Mitral Valve: The mitral valve is normal in structure. Trivial mitral valve regurgitation. No evidence of mitral valve stenosis. Tricuspid Valve: The tricuspid valve is normal in structure. Tricuspid valve regurgitation is trivial. No evidence of tricuspid stenosis. Aortic Valve: The aortic valve is normal in structure. Aortic valve regurgitation is not visualized. No aortic stenosis is present. Pulmonic Valve: The pulmonic valve was normal in structure. Pulmonic valve regurgitation is trivial. No evidence of pulmonic stenosis. Aorta: Aortic dilatation noted. There is borderline dilatation of the ascending aorta, measuring 39 mm. Venous: The inferior vena cava is normal in size with greater than 50% respiratory variability, suggesting right atrial pressure of 3 mmHg. IAS/Shunts: Evidence of atrial level shunting detected by color flow Doppler. Agitated saline contrast was given intravenously to evaluate for intracardiac shunting. Agitated saline contrast bubble study was positive with shunting observed within 3-6 cardiac cycles suggestive of interatrial shunt.  LEFT VENTRICLE PLAX 2D LVIDd:         4.80 cm     Diastology LVIDs:         2.80 cm     LV e' medial:    10.80 cm/s LV PW:         0.90 cm     LV E/e' medial:  7.3 LV IVS:        1.00 cm     LV e' lateral:   10.90 cm/s LVOT diam:     2.30 cm     LV E/e' lateral: 7.2 LV SV:         90 LV SV Index:   51 LVOT Area:     4.15 cm  LV Volumes (MOD) LV vol d, MOD A2C: 79.5 ml LV vol d, MOD A4C: 81.4 ml LV vol s, MOD A2C: 34.5 ml LV vol s, MOD A4C: 30.2 ml LV SV MOD A2C:     45.0 ml LV SV MOD A4C:     81.4 ml LV SV MOD BP:      48.8 ml RIGHT VENTRICLE              IVC RV S prime:  14.80 cm/s  IVC diam: 1.90 cm TAPSE (M-mode): 2.2 cm LEFT ATRIUM             Index        RIGHT ATRIUM           Index LA diam:        2.90 cm 1.64 cm/m   RA Area:     11.20 cm LA Vol (A2C):   50.1 ml 28.31 ml/m  RA Volume:   23.00 ml  13.00 ml/m LA Vol (A4C):   20.6 ml 11.64 ml/m LA Biplane Vol: 33.0 ml 18.65 ml/m  AORTIC VALVE LVOT Vmax:   115.00 cm/s LVOT Vmean:  78.100 cm/s LVOT VTI:    0.216 m  AORTA Ao Root diam: 3.10 cm Ao Asc diam:  3.90 cm MITRAL VALVE MV Area (PHT): 4.49 cm    SHUNTS MV Decel Time: 169 msec    Systemic VTI:  0.22 m MV E velocity: 79.00 cm/s  Systemic Diam: 2.30 cm MV A velocity: 67.40 cm/s MV E/A ratio:  1.17 Jules Oar MD Electronically signed by Jules Oar MD Signature Date/Time: 12/23/2023/2:34:58 PM    Final    MR BRAIN WO CONTRAST Result Date: 12/23/2023 CLINICAL DATA:  Stroke, follow-up. Left-sided weakness and numbness. EXAM: MRI HEAD WITHOUT CONTRAST TECHNIQUE: Multiplanar, multiecho pulse sequences of the brain and surrounding structures were obtained without intravenous contrast. COMPARISON:  CT head without contrast and CT angio head and neck 12/22/2023 FINDINGS: Brain: No acute infarct, hemorrhage, or mass lesion is present. A single subcortical T2 hyperintensity measures 7 mm in the left parietal lobe on image 23 of series 6. No other significant white matter disease is present. The ventricles are of normal size. No significant extraaxial fluid collection is present. The cerebellar tonsils extend 8 mm below the foramen magnum in the midline. The tonsils are somewhat pointed. Crowding the foramen magnum is noted. Brainstem and cerebellum are otherwise within normal limits. The internal auditory canals are within normal limits. Vascular: Flow is present in the major intracranial arteries. Skull and upper cervical spine: The upper cervical spine is within normal limits. Marrow signal is normal. Sinuses/Orbits: The paranasal sinuses and  mastoid air cells are clear. The globes and orbits are within normal limits. IMPRESSION: 1. No acute intracranial abnormality. 2. Single subcortical T2 hyperintensity in the left parietal lobe. The finding is nonspecific but can be seen in the setting of chronic microvascular ischemia, a demyelinating process such as multiple sclerosis, vasculitis, complicated migraine headaches, or as the sequelae of a prior infectious or inflammatory process. 3. 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 The tonsils are somewhat pointed and mild crowding of the foramen magnum is present. Electronically Signed   By: Audree Leas M.D.   On: 12/23/2023 12:41    Vitals:   12/23/23 1000 12/23/23 1100 12/23/23 1200 12/23/23 1300  BP: 112/83 112/75 (!) 120/91 127/77  Pulse: 86 84 85 87  Resp: 18 16 12 14   Temp:   98.3 F (36.8 C)   TempSrc:   Oral   SpO2: 99% 99% 100% 100%  Weight:      Height:         PHYSICAL EXAM General:  Alert, well-nourished, well-developed patient in no acute distress Psych:  Mood and affect appropriate for situation CV: Regular rate and rhythm on monitor Respiratory:  Regular, unlabored respirations on room air  NEURO:  Mental Status: AA&Ox3, patient is  able to give some history Speech/Language: speech is without dysarthria but slow in character with word finding difficulties.  Naming and repetition are intact.  Cranial Nerves:  II: PERRL. Visual fields full.  III, IV, VI: EOMI. Eyelids elevate symmetrically.  V: Sensation is intact to light touch and diminished on the left VII: Subtle left facial droop VIII: hearing intact to voice. IX, X: Phonation is normal.  UE:[id number] shrug 5/5. XII: tongue is midline without fasciculations. Motor: Able to move all 4 extremities with good antigravity strength but drift noted in the left arm and right leg slightly stronger than left Tone: is normal and bulk is  normal Sensation- Intact to light touch bilaterally.  Coordination: FTN intact bilaterally Gait- deferred  Most Recent NIH  1a Level of Conscious.: 0 1b LOC Questions: 0 1c LOC Commands: 0 2 Best Gaze: 0 3 Visual: 0 4 Facial Palsy: 1 5a Motor Arm - left: 1 5b Motor Arm - Right: 0 6a Motor Leg - Left: 0 6b Motor Leg - Right: 0 7 Limb Ataxia: 0 8 Sensory: 1 9 Best Language: 1 10 Dysarthria: 0 11 Extinct. and Inatten.: 0 TOTAL: 4   ASSESSMENT/PLAN  Ms. DESHONDRA WORST is a 43 y.o. female with history of hypertension, hypothyroidism and SVT admitted for left-sided weakness, left facial droop and left-sided sensory deficit.  She was given TNK to treat possible stroke.  MRI was negative for acute infarct, but some deficits remain.  NIH on Admission 4  Stroke aborted with TNK versus strokelike episode Code Stroke CT head No acute abnormality.  Partially empty sella, cerebellar tonsillar ectopia ASPECTS 10.    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 8 mm below foramen magnum 2D Echo EF 60 to 65%, borderline dilatation of ascending aorta, evidence of atrial level shunting detected by color-flow Doppler, agitated saline contrast bubble study positive with shunting observed within 3-6 cardiac cycles Consider TEE to further characterize PFO LDL 103 HgbA1c 4.9 VTE prophylaxis -SCDs No antithrombotic prior to admission, DAPT to be started tomorrow Therapy recommendations:  Pending Disposition: Pending  PFO 2D echocardiogram demonstrated right to left shunting Consider TEE to further characterize PFO  Hypertension Home meds: Diltiazem  120 mg daily, losartan-hydrochlorothiazide 50-12.5 mg daily Stable Blood Pressure Goal: BP  less than 180/105   Hyperlipidemia Home meds: None  LDL 103, goal < 70 Add  atorvastatin  40 mg daily Continue statin at discharge  Other Stroke Risk Factors None   Other Active Problems 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 Cortney E Bucky Cardinal , MSN, AGACNP-BC [facility] See Amion for schedule and pager information 12/23/2023 3:31 PM  I have personally obtained history,examined this patient, reviewed notes, independently viewed imaging studies, participated in medical decision making and plan of care.ROS completed by me personally and pertinent positives fully documented  I have made any additions or clarifications directly to the above note. Agree with note above.  She presented with left face and upper extremity numbness and weakness and was treated with IV TNK and has shown improvement but MRI shows no acute stroke.  She does have an incidental Chiari I malformation which can  be evaluated as an outpatient with referral to neurosurgery.  Recommend continue strict blood pressure control and close neurological monitoring as per post TNK protocol.  Mobilize out of bed.  Therapy consults.  Continue ongoing stroke workup.  Recommend start aspirin for stroke prevention and aggressive risk factor modification. This patient is critically ill and at significant risk of neurological worsening, death and care requires constant monitoring of vital signs, hemodynamics,respiratory and cardiac monitoring, extensive review of multiple databases, frequent neurological assessment, discussion with family, other specialists and medical decision making of high complexity.I have made any additions or clarifications directly to the above note.This critical care time does not reflect procedure time, or teaching time or supervisory time of PA/NP/Med Resident etc but could involve  care discussion time.  I spent 30 minutes of neurocritical care time  in the care of  this patient.      Ardella Beaver, MD Medical Director [facility] Pager: [phone] 12/23/2023 5:02 PM   To contact Stroke Continuity provider, please refer to [email or web address]. After hours, contact General Neurology

## 2023-12-23 NOTE — TOC CM/SW Note (Signed)
 Transition of Care [facility]) - Inpatient Brief Assessment   Patient Details  Name: Kelsey Friedman MRN: [id number] Date of Birth: Dec 11, 1980  Transition of Care [facility] CM/SW Contact:    Dariann Huckaba M, RN Phone Number: 12/23/2023, 3:49 PM   Clinical Narrative: [name] is a 43 y.o. female with history of hypertension, hypothyroidism and SVT admitted for left-sided weakness, left facial droop and left-sided sensory deficit.  She was given TNK to treat possible stroke.  MRI was negative for acute infarct, but some deficits remain.  NIH on Admission 4.  TOC will continue to follow for discharge needs; please consult as needs arise.    Transition of Care Asessment: Insurance and Status: Insurance coverage has been reviewed Patient has primary care physician: Yes (Dr. Joice Nares) Home environment has been reviewed: Lives with spouse Prior level of function:: Independent Prior/Current Home Services: No current home services Social Drivers of Health Review: SDOH reviewed no interventions necessary Readmission risk has been reviewed: Yes Transition of care needs: transition of care needs identified, TOC will continue to follow  Calla Catchings, RN, BSN  Trauma/Neuro ICU Case Manager [phone]

## 2023-12-23 NOTE — Evaluation (Signed)
 Speech Language Pathology Evaluation Patient Details Name: Kelsey Friedman MRN: [id number] DOB: Jun 06, 1981 Today's Date: 12/23/2023 Time: 6213-0865 SLP Time Calculation (min) (ACUTE ONLY): 23 min  Problem List:  Patient Active Problem List   Diagnosis Date Noted   Stroke determined by clinical assessment (HCC) 12/22/2023   Mild persistent asthma without complication 06/16/2017   Seasonal and perennial allergic rhinitis 04/29/2014   Pulmonary embolism (HCC) 10/28/2010   Extrinsic asthma 05/05/2010   Past Medical History:  Past Medical History:  Diagnosis Date   Asthma    PE (pulmonary embolism)    SOB (shortness of breath)    Past Surgical History:  Past Surgical History:  Procedure Laterality Date   CHOLECYSTECTOMY     TONSILLECTOMY     HPI:  Kelsey Friedman is a 43 y.o. female with hx of SVT, migraines, HTN, hypothyroidism admitted with numbness on the left side of her face and left upper extremity weakness. CT no evidence of an acute intracranial abnormality. MRI completed but results pending.   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 Pt had no concerns with speech or language but stated "it sometimes may take a little longer to think of it but I can get the correct answer." She and husband state this has improved from yesterday. Her speech is 100% intelligible and no difficulty with language. Overall, her cognition was within normal limits. Several instances of responses that were not significantly delayed but may not have been as rapid as her normal response time. She was given the SLUMS and scored a 28/30 with 27 being considered within normal range. Only items missed were 2 points on memory word recall (accurate with semantic cues). Functionally she was able to recall detailed information from discussion with MD this morning with husband confirming. She  reports using phone and calendar to recall important information. She stated having an easier time reading long texts from family today without difficulty. Further ST in acute care is not recommended. 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 Discussed with pt and spouse if she/they notice cognitive difficulty once home or back at work she can be referred for outpatient ST.    SLP Assessment  SLP Recommendation/Assessment: Patient does not need any further Speech Lanaguage Pathology Services (can have outpatient if difficulties arise once home/back to work) SLP Visit Diagnosis: Cognitive communication deficit (R41.841)    Recommendations for follow up therapy are one component of a multi-disciplinary discharge planning process, led by the attending physician.  Recommendations may be updated based on patient status, additional functional criteria and insurance authorization.    Follow Up Recommendations  Other (comment) (educated re: outpatient if has difficulty at home or when back to work)    Assistance Recommended at Discharge  None  Functional Status Assessment Patient has not had a recent decline in their functional status  Frequency and Duration           SLP Evaluation Cognition  Overall Cognitive Status: Within Functional Limits for tasks assessed Arousal/Alertness: Awake/alert Orientation Level: Oriented X4 Year: 2025 Day of Week: Correct Attention: Sustained Sustained Attention: Appears intact Memory: Impaired Memory Impairment:  (recalled 3of 5 words but recalling all information from MD confirmed by husband) Awareness: Appears intact Problem Solving: Appears intact Safety/Judgment: Appears intact  Comprehension  Auditory Comprehension Overall Auditory Comprehension: Appears within functional limits for tasks assessed Visual  Recognition/Discrimination Discrimination: Not tested Reading Comprehension Reading Status: Not tested (states she is reading text messages today better than yesterday)    Expression Expression Primary Mode of Expression: Verbal Verbal Expression Overall Verbal Expression: Appears within functional limits for tasks assessed Initiation: No impairment Level of Generative/Spontaneous Verbalization: Conversation Repetition:  (NT) Naming: No impairment Pragmatics: No impairment Written Expression Dominant Hand: Right Written Expression: Not tested   Oral / Motor  Oral Motor/Sensory Function Overall Oral Motor/Sensory Function: Within functional limits Motor Speech Overall Motor Speech: Appears within functional limits for tasks assessed Respiration: Within functional limits Phonation: Normal Resonance: Within functional limits Articulation: Within functional limitis Intelligibility: Intelligible Motor Planning: Witnin functional limits Motor Speech Errors: Not applicable            Kelsey Friedman 12/23/2023, 12:27 PM

## 2023-12-23 NOTE — Progress Notes (Signed)
EEG complete, results pending 

## 2023-12-23 NOTE — Evaluation (Signed)
 Physical Therapy Evaluation Patient Details Name: Kelsey Friedman MRN: [id number] DOB: [birth year] Today's Date: 12/23/2023  History of Present Illness  43 y.o. female presents to [facility] on 12/22/2023 with L facial numbness and LUE weakness. 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 PMH includes SVT, HTN, PE, asthma, hypothyroidism.  Clinical Impression  Pt presents to PT with reports of continued L weakness and sensation deficits. PT notes L weakness compared to R side with increased time for mobility of LUE. Pt is able to transfer and ambulate for household distances with reduced stance time on LLE but no buckling noted. PT encourages continued frequent mobilization with staff assistance. Outpatient PT is recommended at the time of discharge.        If plan is discharge home, recommend the following: A little help with bathing/dressing/bathroom;Assistance with cooking/housework;Help with stairs or ramp for entrance;Assist for transportation   Can travel by private vehicle        Equipment Recommendations None recommended by PT  Recommendations for Other Services       Functional Status Assessment Patient has had a recent decline in their functional status and demonstrates the ability to make significant improvements in function in a reasonable and predictable amount of time.     Precautions / Restrictions Precautions Precautions: Fall Restrictions Weight Bearing Restrictions Per Provider Order: No      Mobility  Bed Mobility Overal bed mobility: Modified Independent                  Transfers Overall transfer level: Needs assistance Equipment used: None Transfers: Sit to/from Stand Sit to Stand: Supervision                Ambulation/Gait Ambulation/Gait assistance: Contact guard assist Gait Distance (Feet): 100 Feet Assistive device: None Gait Pattern/deviations: Step-to pattern Gait velocity: reduced Gait velocity interpretation: <1.8 ft/sec,  indicate of risk for recurrent falls   General Gait Details: slowed step-to gait, reduced stance time on LLE  Stairs            Wheelchair Mobility     Tilt Bed    Modified Rankin (Stroke Patients Only)       Balance Overall balance assessment: Needs assistance Sitting-balance support: No upper extremity supported, Feet supported Sitting balance-Leahy Scale: Good     Standing balance support: No upper extremity supported, During functional activity Standing balance-Leahy Scale: Good                               Pertinent Vitals/Pain Pain Assessment Pain Assessment: 0-10 Pain Score: 5  Pain Location: head Pain Descriptors / Indicators: Headache Pain Intervention(s): Monitored during session    Home Living Family/patient expects to be discharged to:: Private residence Living Arrangements: Spouse/significant other Available Help at Discharge: Family;Available PRN/intermittently Type of Home: House Home Access: Level entry       Home Layout: Two level;Able to live on main level with bedroom/bathroom Home Equipment: None      Prior Function Prior Level of Function : Independent/Modified Independent;Working/employed;Driving             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 Upper Extremity Assessment: LUE deficits/detail LUE Deficits / Details: ROM WFL, strength 4-/5 with formal MMT, very slowed movements    Lower Extremity Assessment Lower Extremity Assessment: LLE deficits/detail LLE Deficits / Details: ROM WFL, grossly 4-/5  LLE Sensation: decreased light touch (pt reports continued heaviness in LLE)    Cervical / Trunk Assessment Cervical / Trunk Assessment: Normal  Communication   Communication Communication: Impaired Factors Affecting Communication: Difficulty expressing self    Cognition Arousal: Alert Behavior During Therapy: WFL for tasks  assessed/performed   PT - Cognitive impairments: No apparent impairments                         Following commands: Intact       Cueing Cueing Techniques: Verbal cues     General Comments General comments (skin integrity, edema, etc.): VSS on RA    Exercises     Assessment/Plan    PT Assessment Patient needs continued PT services  PT Problem List Decreased strength;Decreased activity tolerance;Decreased mobility;Decreased balance;Impaired sensation       PT Treatment Interventions DME instruction;Gait training;Stair training;Functional mobility training;Therapeutic activities;Therapeutic exercise;Balance training;Patient/family education;Neuromuscular re-education    PT Goals (Current goals can be found in the Care Plan section)  Acute Rehab PT Goals Patient Stated Goal: to return to independence PT Goal Formulation: With patient/family Time For Goal Achievement: 01/06/24 Potential to Achieve Goals: Good Additional Goals Additional Goal #1: Pt will score >19/24 on the DGI to indicate a reduced risk for falls    Frequency Min 3X/week     Co-evaluation               AM-PAC PT "6 Clicks" Mobility  Outcome Measure Help needed turning from your back to your side while in a flat bed without using bedrails?: None Help needed moving from lying on your back to sitting on the side of a flat bed without using bedrails?: None Help needed moving to and from a bed to a chair (including a wheelchair)?: A Little Help needed standing up from a chair using your arms (e.g., wheelchair or bedside chair)?: A Little Help needed to walk in hospital room?: A Little Help needed climbing 3-5 steps with a railing? : A Little 6 Click Score: 20    End of Session Equipment Utilized During Treatment: Gait belt Activity Tolerance: Patient tolerated treatment well Patient left: in bed;with call bell/phone within reach Nurse Communication: Mobility status PT Visit Diagnosis:  Other abnormalities of gait and mobility (R26.89);Other symptoms and signs involving the nervous system (R29.898)    Time: 1610-9604 PT Time Calculation (min) (ACUTE ONLY): 30 min   Charges:   PT Evaluation $PT Eval Low Complexity: 1 Low   PT General Charges $$ ACUTE PT VISIT: 1 Visit         Rexie Catena, PT, DPT Acute Rehabilitation Office [phone]   Rexie Catena 12/23/2023, 3:58 PM

## 2023-12-24 ENCOUNTER — Inpatient Hospital Stay (HOSPITAL_COMMUNITY)

## 2023-12-24 DIAGNOSIS — Z8659 Personal history of other mental and behavioral disorders: Secondary | ICD-10-CM

## 2023-12-24 DIAGNOSIS — G935 Compression of brain: Secondary | ICD-10-CM | POA: Insufficient documentation

## 2023-12-24 DIAGNOSIS — R29703 NIHSS score 3: Secondary | ICD-10-CM

## 2023-12-24 DIAGNOSIS — Q2112 Patent foramen ovale: Secondary | ICD-10-CM

## 2023-12-24 DIAGNOSIS — E785 Hyperlipidemia, unspecified: Secondary | ICD-10-CM | POA: Insufficient documentation

## 2023-12-24 DIAGNOSIS — I10 Essential (primary) hypertension: Secondary | ICD-10-CM | POA: Insufficient documentation

## 2023-12-24 DIAGNOSIS — I639 Cerebral infarction, unspecified: Secondary | ICD-10-CM | POA: Diagnosis not present

## 2023-12-24 LAB — BASIC METABOLIC PANEL WITH GFR
Anion gap: 11 (ref 5–15)
BUN: 11 mg/dL (ref 6–20)
CO2: 20 mmol/L — ABNORMAL LOW (ref 22–32)
Calcium: 8.9 mg/dL (ref 8.9–10.3)
Chloride: 106 mmol/L (ref 98–111)
Creatinine, Ser: 0.76 mg/dL (ref 0.44–1.00)
GFR, Estimated: >60 mL/min (ref >60)
Glucose, Bld: 95 mg/dL (ref 70–99)
Potassium: 3.5 mmol/L (ref 3.5–5.1)
Sodium: 137 mmol/L (ref 135–145)

## 2023-12-24 LAB — CBC
HCT: 33.4 % — ABNORMAL LOW (ref 36.0–46.0)
Hemoglobin: 10.2 g/dL — ABNORMAL LOW (ref 12.0–15.0)
MCH: 22.7 pg — ABNORMAL LOW (ref 26.0–34.0)
MCHC: 30.5 g/dL (ref 30.0–36.0)
MCV: 74.2 fL — ABNORMAL LOW (ref 80.0–100.0)
Platelets: 431 10*3/uL — ABNORMAL HIGH (ref 150–400)
RBC: 4.5 MIL/uL (ref 3.87–5.11)
RDW: 17.4 % — ABNORMAL HIGH (ref 11.5–15.5)
WBC: 9.7 10*3/uL (ref 4.0–10.5)
nRBC: 0 % (ref 0.0–0.2)

## 2023-12-24 LAB — RAPID URINE DRUG SCREEN, HOSP PERFORMED
Amphetamines: NOT DETECTED
Barbiturates: NOT DETECTED
Benzodiazepines: NOT DETECTED
Cocaine: NOT DETECTED
Opiates: NOT DETECTED
Tetrahydrocannabinol: NOT DETECTED

## 2023-12-24 MED ORDER — ATORVASTATIN CALCIUM 40 MG PO TABS: 40.0000 mg | ORAL_TABLET | Freq: Every day | ORAL | 0 refills | Status: DC

## 2023-12-24 MED ORDER — ASPIRIN 81 MG PO TBEC: 81.0000 mg | DELAYED_RELEASE_TABLET | Freq: Every day | ORAL | 12 refills | Status: AC

## 2023-12-24 MED ORDER — CLOPIDOGREL BISULFATE 75 MG PO TABS
75.0000 mg | ORAL_TABLET | Freq: Every day | ORAL | Status: DC
  Administered 2023-12-24: 75 mg via ORAL
  Filled 2023-12-24: qty 1

## 2023-12-24 MED ORDER — ASPIRIN 81 MG PO TBEC
81.0000 mg | DELAYED_RELEASE_TABLET | Freq: Every day | ORAL | Status: DC
  Administered 2023-12-24: 81 mg via ORAL
  Filled 2023-12-24: qty 1

## 2023-12-24 MED ORDER — CLOPIDOGREL BISULFATE 75 MG PO TABS: 75.0000 mg | ORAL_TABLET | Freq: Every day | ORAL | 0 refills | Status: DC

## 2023-12-24 NOTE — Progress Notes (Signed)
 VASCULAR LAB    Bilateral lower extremity venous duplex has been performed.  See CV proc for preliminary results.   Nissi Doffing, RVT 12/24/2023, 3:20 PM

## 2023-12-24 NOTE — Plan of Care (Signed)
  Problem: Education: Goal: Knowledge of disease or condition will improve Outcome: Progressing Goal: Knowledge of secondary prevention will improve (MUST DOCUMENT ALL) Outcome: Progressing Goal: Knowledge of patient specific risk factors will improve (DELETE if not current risk factor) Outcome: Progressing   Problem: Self-Care: Goal: Ability to participate in self-care as condition permits will improve Outcome: Progressing Goal: Verbalization of feelings and concerns over difficulty with self-care will improve Outcome: Progressing Goal: Ability to communicate needs accurately will improve Outcome: Progressing   Problem: Nutrition: Goal: Risk of aspiration will decrease Outcome: Progressing Goal: Dietary intake will improve Outcome: Progressing   Problem: Education: Goal: Knowledge of General Education information will improve Description: Including pain rating scale, medication(s)/side effects and non-pharmacologic comfort measures Outcome: Progressing   Problem: Clinical Measurements: Goal: Ability to maintain clinical measurements within normal limits will improve Outcome: Progressing Goal: Will remain free from infection Outcome: Progressing Goal: Diagnostic test results will improve Outcome: Progressing Goal: Respiratory complications will improve Outcome: Progressing Goal: Cardiovascular complication will be avoided Outcome: Progressing   Problem: Activity: Goal: Risk for activity intolerance will decrease Outcome: Progressing   Problem: Pain Managment: Goal: General experience of comfort will improve and/or be controlled Outcome: Progressing   Problem: Safety: Goal: Ability to remain free from injury will improve Outcome: Progressing   Problem: Skin Integrity: Goal: Risk for impaired skin integrity will decrease Outcome: Progressing

## 2023-12-24 NOTE — Progress Notes (Signed)
 DISCHARGE NOTE HOME Kelsey Friedman to be discharged Home per MD order. Discussed prescriptions and follow up appointments with the patient. Prescriptions given to patient; medication list explained in detail. Patient verbalized understanding.  Skin clean, dry and intact without evidence of skin break down, no evidence of skin tears noted. IV catheter discontinued intact. Site without signs and symptoms of complications. Dressing and pressure applied. Pt denies pain at the site currently. No complaints noted.  Patient free of lines, drains, and wounds.   An After Visit Summary (AVS) was printed and given to the patient. Patient escorted via wheelchair, and discharged home via private auto.  Tonda Francisco, RN

## 2023-12-24 NOTE — Discharge Summary (Addendum)
 Stroke Discharge Summary  Patient ID: Kelsey Friedman   MRN: [id number]      DOB: Jun 22, 1981  Date of Admission: 12/22/2023 Date of Discharge: 12/24/2023  Attending Physician:  Stroke, Md, MD Patient's PCP:  Kelsey Star, MD  DISCHARGE DIAGNOSIS: Stroke aborted with TNK versus strokelike episode   Principal Problem:   Stroke determined by clinical assessment [facility]) Active Problems:   Chiari I malformation (HCC)   History of panic attacks   PFO (patent foramen ovale)   Essential hypertension   Hyperlipidemia   Allergies as of 12/24/2023   No Known Allergies      Medication List     STOP taking these medications    diphenhydrAMINE 25 mg capsule Commonly known as: BENADRYL   zolpidem 12.5 MG CR tablet Commonly known as: AMBIEN CR       TAKE these medications    albuterol  108 (90 Base) MCG/ACT inhaler Commonly known as: ProAir  HFA Inhale 2 puffs into the lungs every 4 (four) hours as needed for wheezing or shortness of breath.   aspirin EC 81 MG tablet Take 1 tablet (81 mg total) by mouth daily. Swallow whole. Start taking on: December 25, 2023   atorvastatin  40 MG tablet Commonly known as: LIPITOR Take 1 tablet (40 mg total) by mouth daily. Start taking on: December 25, 2023   cetirizine 10 MG tablet Commonly known as: ZYRTEC Take 10 mg by mouth daily.   clopidogrel 75 MG tablet Commonly known as: PLAVIX Take 1 tablet (75 mg total) by mouth daily. Start taking on: December 25, 2023   diltiazem  120 MG 24 hr capsule Commonly known as: CARDIZEM  CD Take 1 capsule (120 mg total) by mouth daily.   escitalopram  20 MG tablet Commonly known as: LEXAPRO  Take 20 mg by mouth daily.   Fluticasone  Furoate 100 MCG/ACT Aepb Commonly known as: Arnuity Ellipta  Inhale 1 puff into the lungs daily.   levothyroxine  75 MCG tablet Commonly known as: SYNTHROID  Take 75 mcg by mouth every morning.   losartan-hydrochlorothiazide 50-12.5 MG tablet Commonly known as:  HYZAAR Take 1 tablet by mouth daily.   multivitamin tablet Take 1 tablet by mouth daily.        LABORATORY STUDIES CBC    Component Value Date/Time   WBC 9.7 12/24/2023 1402   RBC 4.50 12/24/2023 1402   HGB 10.2 (L) 12/24/2023 1402   HGB 14.1 06/16/2017 1047   HCT 33.4 (L) 12/24/2023 1402   HCT 41.9 06/16/2017 1047   PLT 431 (H) 12/24/2023 1402   PLT 277 06/16/2017 1047   MCV 74.2 (L) 12/24/2023 1402   MCV 90 06/16/2017 1047   MCH 22.7 (L) 12/24/2023 1402   MCHC 30.5 12/24/2023 1402   RDW 17.4 (H) 12/24/2023 1402   RDW 14.0 06/16/2017 1047   LYMPHSABS 2.0 12/22/2023 1050   LYMPHSABS 1.9 06/16/2017 1047   MONOABS 0.8 12/22/2023 1050   EOSABS 0.1 12/22/2023 1050   EOSABS 0.1 06/16/2017 1047   BASOSABS 0.1 12/22/2023 1050   BASOSABS 0.0 06/16/2017 1047   CMP    Component Value Date/Time   NA 137 12/24/2023 0713   K 3.5 12/24/2023 0713   CL 106 12/24/2023 0713   CO2 20 (L) 12/24/2023 0713   GLUCOSE 95 12/24/2023 0713   BUN 11 12/24/2023 0713   CREATININE 0.76 12/24/2023 0713   CALCIUM  8.9 12/24/2023 0713   PROT 7.3 12/22/2023 1050   ALBUMIN 4.1 12/22/2023 1050   AST 22 12/22/2023 1050  ALT 16 12/22/2023 1050   ALKPHOS 52 12/22/2023 1050   BILITOT 1.3 (H) 12/22/2023 1050   GFRNONAA >60 12/24/2023 0713   GFRAA >90 03/04/2014 1156   COAGS Lab Results  Component Value Date   INR 1.1 12/22/2023   INR 1.02 12/03/2010   INR 1.9 09/15/2010   Lipid Panel    Component Value Date/Time   CHOL 163 12/23/2023 0624   TRIG 75 12/23/2023 0624   HDL 45 12/23/2023 0624   CHOLHDL 3.6 12/23/2023 0624   VLDL 15 12/23/2023 0624   LDLCALC 103 (H) 12/23/2023 0624   HgbA1C  Lab Results  Component Value Date   HGBA1C 4.9 12/22/2023   Urinalysis    Component Value Date/Time   COLORURINE YELLOW 12/03/2010 1021   APPEARANCEUR CLEAR 12/03/2010 1021   LABSPEC 1.017 12/03/2010 1021   PHURINE 6.5 12/03/2010 1021   GLUCOSEU NEGATIVE 12/03/2010 1021   HGBUR NEGATIVE  12/03/2010 1021   BILIRUBINUR NEGATIVE 12/03/2010 1021   KETONESUR NEGATIVE 12/03/2010 1021   PROTEINUR NEGATIVE 12/03/2010 1021   UROBILINOGEN 0.2 12/03/2010 1021   NITRITE NEGATIVE 12/03/2010 1021   LEUKOCYTESUR  12/03/2010 1021    NEGATIVE MICROSCOPIC NOT DONE ON URINES WITH NEGATIVE PROTEIN, BLOOD, LEUKOCYTES, NITRITE, OR GLUCOSE <1000 mg/dL.   Urine Drug Screen     Component Value Date/Time   LABOPIA NONE DETECTED 12/24/2023 1029   COCAINSCRNUR NONE DETECTED 12/24/2023 1029   LABBENZ NONE DETECTED 12/24/2023 1029   AMPHETMU NONE DETECTED 12/24/2023 1029   THCU NONE DETECTED 12/24/2023 1029   LABBARB NONE DETECTED 12/24/2023 1029    Alcohol Level    Component Value Date/Time   [facility] <15 12/22/2023 1050     SIGNIFICANT DIAGNOSTIC STUDIES EEG adult Result Date: 12/23/2023 Kelsey Lack, MD     12/23/2023  5:29 PM Patient Name: Kelsey Friedman MRN: [id number] Epilepsy Attending: Arleene Friedman Referring Physician/Provider: Colon Dear, NP Date: 12/23/2023 Duration: 23.32 mins Patient history: 43yo F with left-sided weakness, left facial droop and left-sided sensory deficit. EEG to evaluate for seizure Level of alertness: Awake AEDs during EEG study: None Technical aspects: This EEG study was done with scalp electrodes positioned according to the 10-20 International system of electrode placement. Electrical activity was reviewed with band pass filter of 1-70Hz , sensitivity of 7 uV/mm, display speed of 4mm/sec with a 60Hz  notched filter applied as appropriate. EEG data were recorded continuously and digitally stored.  Video monitoring was available and reviewed as appropriate. Description: No posterior dominant rhythm was seen. There is 12-13hz  beta activity distributed symmetrically and diffusely. Hyperventilation and photic stimulation were not performed.   IMPRESSION: This study is within normal limits. No seizures or epileptiform discharges were seen throughout the  recording. A normal interictal EEG does not exclude the diagnosis of epilepsy. Kelsey Friedman   ECHOCARDIOGRAM COMPLETE BUBBLE STUDY Result Date: 12/23/2023    ECHOCARDIOGRAM REPORT   Patient Name:   Kelsey Friedman [facility] Date of Exam: 12/23/2023 Medical Rec #:  [id number]        Height:       64.0 in Accession #:    [id number]       Weight:       158.0 lb Date of Birth:  [birth year]        BSA:          1.770 m Patient Age:    43 years         BP:  108/85 mmHg Patient Gender: F                HR:           84 bpm. Exam Location:  Inpatient Procedure: 2D Echo, Cardiac Doppler, Color Doppler and Saline Contrast Bubble            Study (Both Spectral and Color Flow Doppler were utilized during            procedure). Indications:    Stroke  History:        Patient has prior history of Echocardiogram examinations, most                 recent 02/18/2010. Stroke, Signs/Symptoms:Dyspnea and Shortness                 of Breath; Risk Factors:Hypertension. Pulmonary embolus.  Sonographer:    Raynelle Callow RDCS Referring Phys: [id number] DEVON SHAFER IMPRESSIONS  1. Left ventricular ejection fraction, by estimation, is 60 to 65%. The left ventricle has normal function. The left ventricle has no regional wall motion abnormalities. Left ventricular diastolic parameters were normal.  2. Right ventricular systolic function is normal. The right ventricular size is normal. Tricuspid regurgitation signal is inadequate for assessing PA pressure.  3. The mitral valve is normal in structure. Trivial mitral valve regurgitation. No evidence of mitral stenosis.  4. The aortic valve is normal in structure. Aortic valve regurgitation is not visualized. No aortic stenosis is present.  5. Aortic dilatation noted. There is borderline dilatation of the ascending aorta, measuring 39 mm.  6. The inferior vena cava is normal in size with greater than 50% respiratory variability, suggesting right atrial pressure of 3 mmHg.  7. Evidence of atrial level  shunting detected by color flow Doppler. Agitated saline contrast bubble study was positive with shunting observed within 3-6 cardiac cycles suggestive of interatrial shunt. FINDINGS  Left Ventricle: Left ventricular ejection fraction, by estimation, is 60 to 65%. The left ventricle has normal function. The left ventricle has no regional wall motion abnormalities. The left ventricular internal cavity size was normal in size. There is  no left ventricular hypertrophy. Left ventricular diastolic parameters were normal. Right Ventricle: The right ventricular size is normal. No increase in right ventricular wall thickness. Right ventricular systolic function is normal. Tricuspid regurgitation signal is inadequate for assessing PA pressure. Left Atrium: Left atrial size was normal in size. Right Atrium: Right atrial size was normal in size. Pericardium: There is no evidence of pericardial effusion. Mitral Valve: The mitral valve is normal in structure. Trivial mitral valve regurgitation. No evidence of mitral valve stenosis. Tricuspid Valve: The tricuspid valve is normal in structure. Tricuspid valve regurgitation is trivial. No evidence of tricuspid stenosis. Aortic Valve: The aortic valve is normal in structure. Aortic valve regurgitation is not visualized. No aortic stenosis is present. Pulmonic Valve: The pulmonic valve was normal in structure. Pulmonic valve regurgitation is trivial. No evidence of pulmonic stenosis. Aorta: Aortic dilatation noted. There is borderline dilatation of the ascending aorta, measuring 39 mm. Venous: The inferior vena cava is normal in size with greater than 50% respiratory variability, suggesting right atrial pressure of 3 mmHg. IAS/Shunts: Evidence of atrial level shunting detected by color flow Doppler. Agitated saline contrast was given intravenously to evaluate for intracardiac shunting. Agitated saline contrast bubble study was positive with shunting observed within 3-6 cardiac  cycles suggestive of interatrial shunt.  LEFT VENTRICLE PLAX 2D LVIDd:  4.80 cm     Diastology LVIDs:         2.80 cm     LV e' medial:    10.80 cm/s LV PW:         0.90 cm     LV E/e' medial:  7.3 LV IVS:        1.00 cm     LV e' lateral:   10.90 cm/s LVOT diam:     2.30 cm     LV E/e' lateral: 7.2 LV SV:         90 LV SV Index:   51 LVOT Area:     4.15 cm  LV Volumes (MOD) LV vol d, MOD A2C: 79.5 ml LV vol d, MOD A4C: 81.4 ml LV vol s, MOD A2C: 34.5 ml LV vol s, MOD A4C: 30.2 ml LV SV MOD A2C:     45.0 ml LV SV MOD A4C:     81.4 ml LV SV MOD BP:      48.8 ml RIGHT VENTRICLE             IVC RV S prime:     14.80 cm/s  IVC diam: 1.90 cm TAPSE (M-mode): 2.2 cm LEFT ATRIUM             Index        RIGHT ATRIUM           Index LA diam:        2.90 cm 1.64 cm/m   RA Area:     11.20 cm LA Vol (A2C):   50.1 ml 28.31 ml/m  RA Volume:   23.00 ml  13.00 ml/m LA Vol (A4C):   20.6 ml 11.64 ml/m LA Biplane Vol: 33.0 ml 18.65 ml/m  AORTIC VALVE LVOT Vmax:   115.00 cm/s LVOT Vmean:  78.100 cm/s LVOT VTI:    0.216 m  AORTA Ao Root diam: 3.10 cm Ao Asc diam:  3.90 cm MITRAL VALVE MV Area (PHT): 4.49 cm    SHUNTS MV Decel Time: 169 msec    Systemic VTI:  0.22 m MV E velocity: 79.00 cm/s  Systemic Diam: 2.30 cm MV A velocity: 67.40 cm/s MV E/A ratio:  1.17 Jules Oar MD Electronically signed by Jules Oar MD Signature Date/Time: 12/23/2023/2:34:58 PM    Final    MR BRAIN WO CONTRAST Result Date: 12/23/2023 CLINICAL DATA:  Stroke, follow-up. Left-sided weakness and numbness. EXAM: MRI HEAD WITHOUT CONTRAST TECHNIQUE: Multiplanar, multiecho pulse sequences of the brain and surrounding structures were obtained without intravenous contrast. COMPARISON:  CT head without contrast and CT angio head and neck 12/22/2023 FINDINGS: Brain: No acute infarct, hemorrhage, or mass lesion is present. A single subcortical T2 hyperintensity measures 7 mm in the left parietal lobe on image 23 of series 6. No other  significant white matter disease is present. The ventricles are of normal size. No significant extraaxial fluid collection is present. The cerebellar tonsils extend 8 mm below the foramen magnum in the midline. The tonsils are somewhat pointed. Crowding the foramen magnum is noted. Brainstem and cerebellum are otherwise within normal limits. The internal auditory canals are within normal limits. Vascular: Flow is present in the major intracranial arteries. Skull and upper cervical spine: The upper cervical spine is within normal limits. Marrow signal is normal. Sinuses/Orbits: The paranasal sinuses and mastoid air cells are clear. The globes and orbits are within normal limits. IMPRESSION: 1. No acute intracranial abnormality. 2. Single subcortical T2 hyperintensity in the  left parietal lobe. The finding is nonspecific but can be seen in the setting of chronic microvascular ischemia, a demyelinating process such as multiple sclerosis, vasculitis, complicated migraine headaches, or as the sequelae of a prior infectious or inflammatory process. 3. 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 The tonsils are somewhat pointed and mild crowding of the foramen magnum is present. Electronically Signed   By: Audree Leas M.D.   On: 12/23/2023 12:41   CT ANGIO HEAD NECK W WO CM Result Date: 12/22/2023 CLINICAL DATA:  Stroke/TIA, sudden onset of headache, left-sided arm and leg weakness, numbness, facial droop. EXAM: CT ANGIOGRAPHY HEAD AND NECK WITH AND WITHOUT CONTRAST TECHNIQUE: Multidetector CT imaging of the head and neck was performed using the standard protocol during bolus administration of intravenous contrast. Multiplanar CT image reconstructions and MIPs were obtained to evaluate the vascular anatomy. Carotid stenosis measurements (when applicable) are obtained utilizing NASCET criteria, using the distal internal carotid diameter as the  denominator. RADIATION DOSE REDUCTION: This exam was performed according to the departmental dose-optimization program which includes automated exposure control, adjustment of the mA and/or kV according to patient size and/or use of iterative reconstruction technique. CONTRAST:  75mL OMNIPAQUE  IOHEXOL  350 MG/ML SOLN COMPARISON:  Same day CT head. FINDINGS: CTA NECK FINDINGS Aortic arch: Common origin of the brachiocephalic and left common carotid arteries. Imaged portion shows no evidence of aneurysm or dissection. No significant stenosis of the major arch vessel origins. Pulmonary arteries: As permitted by contrast timing, there are no filling defects in the visualized pulmonary arteries. Subclavian arteries: The subclavian arteries are patent bilaterally. Right carotid system: No evidence of dissection, stenosis (50% or greater), or occlusion. Mild tortuosity of the distal cervical ICA. Left carotid system: No evidence of dissection, stenosis (50% or greater), or occlusion. Vertebral arteries: Codominant. No evidence of dissection, stenosis (50% or greater), or occlusion. Skeleton: No acute or aggressive finding noted. Other neck: The visualized airway is patent. No cervical lymphadenopathy. Upper chest: Visualized lung apices are clear. Review of the MIP images confirms the above findings CTA HEAD FINDINGS ANTERIOR CIRCULATION: The intracranial ICAs are patent bilaterally. No significant stenosis, proximal occlusion, aneurysm, or vascular malformation. MCAs: Patent bilaterally. Mildly prominent venous structure adjacent to the left MCA bifurcation without evidence of aneurysm or vascular malformation. ACAs: Patent bilaterally. Nonvisualized A1 segment of the right ACA, likely congenitally hypoplastic. There is a 2 mm posteromedially projecting outpouching at the junction of the A1 and A2 segments suggestive of anterior communicating artery aneurysm. POSTERIOR CIRCULATION: No significant stenosis, proximal  occlusion, aneurysm, or vascular malformation. PCAs: Patent bilaterally.  Fetal origin of the right PCA. Pcomm: The posterior communicating arteries are visualized bilaterally. SCAs: The superior cerebellar arteries are patent bilaterally. Basilar artery: Patent AICAs: Patent PICAs: Patent Vertebral arteries: The intracranial vertebral arteries are patent. Venous sinuses: As permitted by contrast timing, patent. Anatomic variants: None Review of the MIP images confirms the above findings IMPRESSION: No large vessel occlusion. 2 mm posteriorly medially projecting outpouching at the junction of the A1 and A2 segments of the left ACA suggestive of anterior communicating artery aneurysm. Electronically Signed   By: Denny Flack M.D.   On: 12/22/2023 13:41   CT HEAD CODE STROKE WO CONTRAST Result Date: 12/22/2023 CLINICAL DATA:  Code stroke. Provided history: Neuro deficit, acute, stroke suspected. Left-sided weakness and numbness. EXAM: CT HEAD WITHOUT CONTRAST TECHNIQUE: Contiguous axial images were obtained from the base of the  skull through the vertex without intravenous contrast. RADIATION DOSE REDUCTION: This exam was performed according to the departmental dose-optimization program which includes automated exposure control, adjustment of the mA and/or kV according to patient size and/or use of iterative reconstruction technique. COMPARISON:  None. FINDINGS: Brain: Cerebral volume is normal. Partially empty sella turcica. 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 There is no acute intracranial hemorrhage. No demarcated cortical infarct. No extra-axial fluid collection. No evidence of an intracranial mass. No midline shift. Vascular: No hyperdense vessel. Skull: No calvarial fracture or aggressive osseous lesion. Sinuses/Orbits: No mass or acute finding within the imaged orbits. Mucosal thickening within the bilateral maxillary sinuses  (mild right, minimal left). Small-volume secretions within the right sphenoid sinus. ASPECTS [facility] Stroke Program Early CT Score) - Ganglionic level infarction (caudate, lentiform nuclei, internal capsule, insula, M1-M3 cortex): 7 - Supraganglionic infarction (M4-M6 cortex): 3 Total score (0-10 with 10 being normal): 10 No evidence of an acute intracranial abnormality. These results were communicated to Dr. Doretta Gant at 11:14 amon 4/24/2025by text page via the [facility] messaging system. IMPRESSION: 1.  No evidence of an acute intracranial abnormality. 2. Partially empty sella turcica. This finding can reflect incidental anatomic variation, or alternatively, it can be associated with chronic idiopathic intracranial hypertension (pseudotumor cerebri). 3. 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 4. Mild paranasal sinus disease, as described. Electronically Signed   By: Bascom Lily D.O.   On: 12/22/2023 11:14      HISTORY OF PRESENT ILLNESS 43 y.o. female with history of hypertension, hypothyroidism and SVT admitted for left-sided weakness, left facial droop and left-sided sensory deficit.  She was given TNK to treat possible stroke.  MRI was negative for acute infarct, but some deficits remain.  NIH on Admission 4    HOSPITAL COURSE Stroke like symptoms - complicated migraine versus anxiety/panic attack vs. Functional neurologic disorder Pt presented with heart palpitation, HA, then left facial numbness, with left UE weakness. Went to urgent care then transfer to [facility] for code stroke and has drift in both her left arm and leg, left sensory deficit in left face, arm, and leg.  Pt continued to have stuttering speech, complain of left leg weakness, left sided numbness Code Stroke CT head No acute abnormality.  Partially empty sella, cerebellar tonsillar ectopia ASPECTS 10.    CTA head & neck no LVO MRI no acute abnormality, single  subcortical T2 hyperintensity in left parietal lobe, possibly due to chronic microvascular ischemia, Chiari I malformation with cerebellar tonsils extending 8 mm below foramen magnum 2D Echo EF 60 to 65%, agitated saline contrast bubble study positive with shunting observed within 3-6 cardiac cycles TCD bubble study: positive for PFO US  DVT LE: Negative  LDL 103 HgbA1c 4.9 UDS neg VTE prophylaxis - SCDs No antithrombotic prior to admission, continue Aspirin and Plavix for 3 weeks, then Aspirin alone.  Therapy recommendations:  Outpatient PT/OT, [facility] Disposition: home today    PFO Likely incidental finding 2D echocardiogram demonstrated right to left shunting TCD bubble study completed 4/26 shows PFO, spencer III-IV at rest, III with valsalva Will follow up with Dr. Janett Medin as outpt to decide further investigation   SVT Followed with Dr. Avanell Bob cardiology in 2023 30 day monitor showed 20sec SVT Now on cardizem  Recommend to continue follow up with Dr. Mathilda Solum cerebral aneurysm CTA head and neck  2 mm posteriorly projecting outpouching at junction of A1 and A2 segments of left ACA No intervention needed at this time Follow up with Dr. Janett Medin as outpt to monitor if needed  Hypertension Home meds: Diltiazem  120 mg daily, losartan-hydrochlorothiazide 50-12.5 mg daily Stable Long term BP goal normotensive Avoid hypotension.    Hyperlipidemia Home meds: None  LDL 103, goal < 70 Add  atorvastatin  40 mg daily Continue statin at discharge   Other Stroke Risk Factors None   Other Active Problems Asymptomatic Chiari I malformation on CT/MRI - will need outpatient neurosurgery referral Hypothyroidism - on home meds   DISCHARGE EXAM Blood pressure (!) 136/94, pulse 80, temperature 98.7 F (37.1 C), temperature source Oral, resp. rate 20, height 5\' 4"  (1.626 m), weight 71.7 kg, SpO2 100%. PHYSICAL EXAM General:  Alert, well-nourished, well-developed patient in no acute  distress Psych:  Mood and affect appropriate for situation CV: Regular rate and rhythm on monitor Respiratory:  Regular, unlabored respirations on room air   NEURO:  Mental Status: AA&Ox3, able to give history. Follows all commands and interactive throughout exam. Speech/Language: speech is without dysarthria but slowing with stuttering present.    Cranial Nerves:  II: PERRL. Visual fields full.  III, IV, VI: EOMI. Eyelids elevate symmetrically.  V: Sensation is intact to light touch and diminished on the left VII: Subtle left facial droop VIII: hearing intact to voice. IX, X: Phonation is normal.  [id number] shrug 5/5. XII: tongue is midline without fasciculations. Motor: Able to move all 4 extremities with good antigravity strength Tone: is normal and bulk is normal Sensation- decreased to left. Left frontal midline spit noted with vibration assessment.  Coordination: FTN decreased on right.  Gait- PT eval states patient waked with walker, with slowness and intermittent dragging of left foot.   Discharge NIH: 3  Discharge Diet       Diet   Diet Heart Room service appropriate? Yes with Assist; Fluid consistency: Thin   liquids  DISCHARGE PLAN Disposition:  home with outpatient PT/OT aspirin 81 mg daily and clopidogrel 75 mg daily for secondary stroke prevention for 3 weeks then aspirin alone. Ongoing stroke risk factor control by Primary Care Physician at time of discharge Follow-up PCP Kelsey Star, MD in 2 weeks. Follow-up in [facility] in 4 weeks, please call office to schedule an appointment.  Follow-up with Cardiology for TEE--they will contact you with appointment.  Follow-up with Neurosurgery, please call office.   35 minutes were spent preparing discharge.   Pt seen by Neuro NP/APP and later by MD. Note/plan to be edited by MD as needed.    Audrene Lease, DNP, AGACNP-BC [facility] Please use AMION for contact  information & EPIC for messaging.  ATTENDING NOTE: I reviewed above note and agree with the assessment and plan. Pt was seen and examined.   RN is at the bedside. Pt is awake, alert, eyes open, orientated to age, place, time but psychomotor slowing with stuttering speech. No aphasia but stuttering speech and soft voice, following all simple commands. Able to name and repeat in delayed fasion. No gaze palsy, tracking bilaterally, visual field full, PERRL. No facial droop. Tongue midline. Bilateral UEs 5/5, no drift. RLE 5/5, LLE mild giveaway weakness. Sensation decreased on the left with frontal midline split, b/l FTN intact but slow in action, gait not tested but per pt she walked with walker with PT in the hallway without dragging but slow.  Pt still complaining of  left sided symptoms but MRI negative. Presentation and exam concerning for functional neurologic disorder vs. Anxity/panic attack, less likely complicated migraine. PFO likely incidental finding but will follow up with Dr. Janett Medin as outpt in 4 weeks. She did have SVT in the past following with Dr. Avanell Bob, will continue to follow with her as outpt. On DAPT and statin. PT and OT recommend outpt therapy.   For detailed assessment and plan, please refer to above as I have made changes wherever appropriate.   Consuelo Denmark, MD PhD Stroke Neurology 12/24/2023 6:42 PM

## 2023-12-24 NOTE — Progress Notes (Signed)
 Physical Therapy Treatment Patient Details Name: Kelsey Friedman MRN: [id number] DOB: 24-Jun-1981 Today's Date: 12/24/2023   History of Present Illness 43 y.o. female presents to [facility] hospital on 12/22/2023 with L facial numbness and LUE weakness. 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 PMH includes SVT, HTN, PE, asthma, hypothyroidism.    PT Comments  Pt resting in bed on arrival and agreeable to session with good progress towards acute goals. Pt continued to be limited in safe mobility by decreased activity tolerance, impaired balance/postural reactions and L hemi-weakness. Pt demonstrating gait with grossly CGA with RW for support for hallway distance x2 with seated rest break due to fatigue. Pt taking a few steps in room without UE support of AD with increased postural sway and pt reaching for support with noted L knee buckle. Pt was educated on continued walker use to maximize functional independence, safety, and decrease risk for falls. Pt able to demonstrate safe stair ascent/descent with education and demonstration provided initially with pt demonstrating good carryover. Pt continues to benefit from skilled PT services to progress toward functional mobility goals.        If plan is discharge home, recommend the following: A little help with bathing/dressing/bathroom;Assistance with cooking/housework;Help with stairs or ramp for entrance;Assist for transportation   Can travel by private vehicle        Equipment Recommendations  Rolling walker (2 wheels)    Recommendations for Other Services       Precautions / Restrictions Precautions Precautions: Fall Restrictions Weight Bearing Restrictions Per Provider Order: No     Mobility  Bed Mobility Overal bed mobility: Modified Independent             General bed mobility comments: pt self assisting LLE with bil UE to bring to EOB    Transfers Overall transfer level: Needs assistance Equipment used: None Transfers: Sit  to/from Stand Sit to Stand: Supervision           General transfer comment: supervison for saftey, good hand placement on rise    Ambulation/Gait Ambulation/Gait assistance: Contact guard assist Gait Distance (Feet): 175 Feet (x2 with seated rest) Assistive device: Rolling walker (2 wheels) Gait Pattern/deviations: Step-to pattern Gait velocity: decr     General Gait Details: slowed step-to gait, reduced stance time on LLE, no overt LOB, no buckling noted on L with RW support, pt taking a few steps without RW support with increased postural sway and L knee buckle, significantly increased stability with RW use   Stairs   Stairs assistance: Contact guard assist Stair Management: Two rails, Forwards, Step to pattern Number of Stairs: 2 General stair comments: CGA for saftey, no LOB, education and demonstration of sequencing with pt able to demo back with good recall and carryover   Wheelchair Mobility     Tilt Bed    Modified Rankin (Stroke Patients Only)       Balance Overall balance assessment: Needs assistance Sitting-balance support: No upper extremity supported, Feet supported Sitting balance-Leahy Scale: Good     Standing balance support: Bilateral upper extremity supported, During functional activity Standing balance-Leahy Scale: Fair Standing balance comment: able to static stand without UE support, benefits from RW for dynamic tasks                            Communication Communication Communication: Impaired Factors Affecting Communication: Difficulty expressing self (increased time for word finding)  Cognition Arousal: Alert Behavior During Therapy: [facility] for  tasks assessed/performed   PT - Cognitive impairments: No apparent impairments                         Following commands: Intact      Cueing Cueing Techniques: Verbal cues  Exercises Other Exercises Other Exercises: step taps with LLE x7 to 6" step, cues needed to  clear toes on step, hands on assist to improve LLE hip and knee flexion, R/L rail support for balance    General Comments General comments (skin integrity, edema, etc.): VSS on RA      Pertinent Vitals/Pain Pain Assessment Pain Assessment: No/denies pain Pain Intervention(s): Monitored during session    Home Living                          Prior Function            PT Goals (current goals can now be found in the care plan section) Acute Rehab PT Goals PT Goal Formulation: With patient/family Time For Goal Achievement: 01/06/24 Progress towards PT goals: Progressing toward goals    Frequency    Min 3X/week      PT Plan      Co-evaluation              AM-PAC PT "6 Clicks" Mobility   Outcome Measure  Help needed turning from your back to your side while in a flat bed without using bedrails?: None Help needed moving from lying on your back to sitting on the side of a flat bed without using bedrails?: None Help needed moving to and from a bed to a chair (including a wheelchair)?: A Little Help needed standing up from a chair using your arms (e.g., wheelchair or bedside chair)?: A Little Help needed to walk in hospital room?: A Little Help needed climbing 3-5 steps with a railing? : A Little 6 Click Score: 20    End of Session Equipment Utilized During Treatment: Gait belt Activity Tolerance: Patient tolerated treatment well Patient left: in bed;with call bell/phone within reach;with bed alarm set Nurse Communication: Mobility status PT Visit Diagnosis: Other abnormalities of gait and mobility (R26.89);Other symptoms and signs involving the nervous system (R29.898)     Time: 1191-4782 PT Time Calculation (min) (ACUTE ONLY): 28 min  Charges:    $Gait Training: 23-37 mins PT General Charges $$ ACUTE PT VISIT: 1 Visit                     Rondal Vandevelde R. PTA Acute Rehabilitation Services Office: [phone]   Agapito Horseman 12/24/2023, 10:24 AM

## 2023-12-24 NOTE — Progress Notes (Signed)
 Referral received for DME (RW) and for outpt PT/OT. Met with pt and husband. They prefer [facility] in [facility]. Referral sent to [facility] in [facility]. They don't have a preference for a DME agency. Will contact Jermaine with [employer] and they agreed with agency. Contacted Jermaine and he accepted the referral.

## 2023-12-24 NOTE — Progress Notes (Addendum)
 Per Patient home equipment is to be delivered to home.

## 2023-12-24 NOTE — Evaluation (Signed)
 Occupational Therapy Evaluation Patient Details Name: Kelsey Friedman MRN: [id number] DOB: Apr 04, 1981 Today's Date: 12/24/2023   History of Present Illness   43 y.o. female presents to [facility] hospital on 12/22/2023 with L facial numbness and LUE weakness. 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 PMH includes SVT, HTN, PE, asthma, hypothyroidism.     Clinical Impressions Pt admitted for above, PTA pt lived with spouse and works as a Runner, broadcasting/film/video, independent in ADLs/iADLs and mobility. Pt currently presenting with impaired coordination of the LUE, reports increased challenge with processing movements of L side. Pt also with blurry vision of L eye (see below), suggested follow-up with ophthalmologist. On eval pt completing ADLs with CGA to setup A and ambulating with CGA + RW. OT to continue following pt acutely to progress with LUE coordination and strength, left handout to promote [facility] activities at home. Recommend pt attend outpatient OT services to address listed deficits.      If plan is discharge home, recommend the following:   Assistance with cooking/housework;A little help with bathing/dressing/bathroom     Functional Status Assessment   Patient has had a recent decline in their functional status and demonstrates the ability to make significant improvements in function in a reasonable and predictable amount of time.     Equipment Recommendations   Tub/shower seat     Recommendations for Other Services         Precautions/Restrictions   Precautions Precautions: Fall Restrictions Weight Bearing Restrictions Per Provider Order: No     Mobility Bed Mobility Overal bed mobility: Modified Independent                  Transfers Overall transfer level: Needs assistance Equipment used: None Transfers: Sit to/from Stand Sit to Stand: Supervision                  Balance Overall balance assessment: Needs assistance Sitting-balance support: No upper extremity  supported, Feet supported Sitting balance-Leahy Scale: Good       Standing balance-Leahy Scale: Fair Standing balance comment: Static standing no AD                           ADL either performed or assessed with clinical judgement   ADL Overall ADL's : Needs assistance/impaired Eating/Feeding: Independent;Sitting   Grooming: Standing;Wash/dry hands;Contact guard assist   Upper Body Bathing: Standing;Contact guard assist   Lower Body Bathing: Set up;Sitting/lateral leans   Upper Body Dressing : Modified independent;Sitting   Lower Body Dressing: Supervision/safety;Sit to/from stand   Toilet Transfer: Contact guard assist;Rolling walker (2 wheels);Ambulation   Toileting- Clothing Manipulation and Hygiene: Set up;Sitting/lateral lean       Functional mobility during ADLs: Contact guard assist;Rolling walker (2 wheels) General ADL Comments: Educated pt on [facility] exercises and scanning/anchoring strategies.     Vision Baseline Vision/History: 1 Wears glasses Ability to See in Adequate Light: 0 Adequate Patient Visual Report: Blurring of vision Vision Assessment?: Yes Eye Alignment: Within Functional Limits Ocular Range of Motion: Within Functional Limits Alignment/Gaze Preference: Within Defined Limits Tracking/Visual Pursuits: Able to track stimulus in all quads without difficulty Convergence: Within functional limits Visual Fields: Other (comment) Diplopia Assessment:  [facility]) Depth Perception:  [facility]) Additional Comments: blurry vision within medial-nasal portion of L eye, Pt without blurriness in superior and inferior fields of quadrants     Perception Perception: Within Functional Limits (locates 4/4 objects in cluttered environment)       Praxis Praxis:  [facility]       Pertinent Vitals/Pain Pain Assessment Pain Assessment: Faces Faces Pain Scale: Hurts little more Pain Location: head Pain Descriptors / Indicators: Headache Pain Intervention(s): Monitored  during session     Extremity/Trunk Assessment Upper Extremity Assessment Upper Extremity Assessment: Overall WFL for tasks assessed;LUE deficits/detail LUE Deficits / Details: ROM WFL, strength 4-/5 with formal MMT, very slowed movements. Impaired speed of coordination. Shoulder AROM ~100 deg, PROM WFL LUE Sensation: decreased light touch LUE Coordination: decreased fine motor;decreased gross motor   Lower Extremity Assessment LLE Deficits / Details: ROM WFL, grossly 4-/5 LLE Sensation: decreased light touch   Cervical / Trunk Assessment Cervical / Trunk Assessment: Normal   Communication Communication Communication: Impaired Factors Affecting Communication: Difficulty expressing self (increased time for word finding)   Cognition Arousal: Alert Behavior During Therapy: WFL for tasks assessed/performed                                 Following commands: Intact       Cueing  General Comments   Cueing Techniques: Verbal cues  Pt family and transport in room   Exercises     Shoulder Instructions      Home Living Family/patient expects to be discharged to:: Private residence Living Arrangements: Spouse/significant other Available Help at Discharge: Family;Available PRN/intermittently Type of Home: House Home Access: Level entry     Home Layout: Two level;Able to live on main level with bedroom/bathroom     Bathroom Shower/Tub: Producer, television/film/video: Standard     Home Equipment: None      Lives With: Spouse    Prior Functioning/Environment Prior Level of Function : Independent/Modified Independent;Working/employed;Driving             Mobility Comments: works as a Midwife ADLs Comments: ind    OT Problem List: Decreased coordination;Impaired balance (sitting and/or standing);Impaired sensation;Decreased range of motion;Impaired vision/perception   OT Treatment/Interventions: Self-care/ADL training;Patient/family  education;Therapeutic exercise;Therapeutic activities;Visual/perceptual remediation/compensation      OT Goals(Current goals can be found in the care plan section)   Acute Rehab OT Goals Patient Stated Goal: To go home OT Goal Formulation: With patient Time For Goal Achievement: 01/07/24 Potential to Achieve Goals: Good ADL Goals Pt Will Perform Lower Body Dressing: with modified independence;sit to/from stand Pt Will Transfer to Toilet: with modified independence;ambulating Pt/caregiver will Perform Home Exercise Program: Increased strength;Increased ROM;Left upper extremity;With written HEP provided Additional ADL Goal #1: Pt will demonstrate independence with LUE HEP for increaseds coordination   OT Frequency:  Min 2X/week    Co-evaluation              AM-PAC OT "6 Clicks" Daily Activity     Outcome Measure Help from another person eating meals?: None Help from another person taking care of personal grooming?: A Little Help from another person toileting, which includes using toliet, bedpan, or urinal?: A Little Help from another person bathing (including washing, rinsing, drying)?: A Little Help from another person to put on and taking off regular upper body clothing?: None Help from another person to put on and taking off regular lower body clothing?: A Little 6 Click Score: 20   End of Session Equipment Utilized During Treatment: Gait belt;Rolling walker (2 wheels) Nurse Communication: Mobility status  Activity Tolerance: Patient tolerated treatment well Patient left: in bed;with call bell/phone within reach;Other (comment) (transport at bedside)  OT Visit Diagnosis: Unsteadiness on  feet (R26.81);Muscle weakness (generalized) (M62.81);Hemiplegia and hemiparesis Hemiplegia - Right/Left: Left Hemiplegia - dominant/non-dominant: Non-Dominant Hemiplegia - caused by: Other cerebrovascular disease (L ACOMM Aneurysm)                Time: 1610-9604 OT Time Calculation  (min): 31 min Charges:  OT General Charges $OT Visit: 1 Visit OT Evaluation $OT Eval Low Complexity: 1 Low OT Treatments $Therapeutic Activity: 8-22 mins  12/24/2023  AB, OTR/L  Acute Rehabilitation Services  Office: [phone]   Jorene New 12/24/2023, 2:41 PM

## 2023-12-24 NOTE — Progress Notes (Signed)
 VASCULAR LAB    TCD bubble study has been performed.  See CV proc for preliminary results.   Anslie Spadafora, RVT 12/24/2023, 3:19 PM

## 2023-12-29 ENCOUNTER — Encounter: Payer: Self-pay | Admitting: Cardiology

## 2023-12-29 ENCOUNTER — Ambulatory Visit: Attending: Neurology | Admitting: Physical Therapy

## 2023-12-29 ENCOUNTER — Encounter: Payer: Self-pay | Admitting: Physical Therapy

## 2023-12-29 DIAGNOSIS — R2689 Other abnormalities of gait and mobility: Secondary | ICD-10-CM | POA: Diagnosis present

## 2023-12-29 DIAGNOSIS — R2681 Unsteadiness on feet: Secondary | ICD-10-CM | POA: Diagnosis present

## 2023-12-29 DIAGNOSIS — M6281 Muscle weakness (generalized): Secondary | ICD-10-CM | POA: Diagnosis present

## 2023-12-29 DIAGNOSIS — R279 Unspecified lack of coordination: Secondary | ICD-10-CM | POA: Insufficient documentation

## 2023-12-29 DIAGNOSIS — I10 Essential (primary) hypertension: Secondary | ICD-10-CM | POA: Diagnosis not present

## 2023-12-29 NOTE — Therapy (Signed)
 OUTPATIENT PHYSICAL THERAPY NEURO EVALUATION   Patient Name: Kelsey Friedman MRN: [id number] DOB:November 13, 1980, 43 y.o., female Today's Date: 12/29/2023   PCP: Merl Star, MD REFERRING PROVIDER: Audrene Lease, NP  END OF SESSION:  PT End of Session - 12/29/23 0934     Visit Number 1    Number of Visits 13    Date for PT Re-Evaluation 02/16/24    Authorization Type [employer]    PT Start Time 0932    PT Stop Time 1023    PT Time Calculation (min) 51 min    Activity Tolerance Patient tolerated treatment well    Behavior During Therapy [facility] for tasks assessed/performed             Past Medical History:  Diagnosis Date   Asthma    PE (pulmonary embolism)    SOB (shortness of breath)    Past Surgical History:  Procedure Laterality Date   CHOLECYSTECTOMY     TONSILLECTOMY     Patient Active Problem List   Diagnosis Date Noted   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   History of panic attacks 12/24/2023   PFO (patent foramen ovale) 12/24/2023   Essential hypertension 12/24/2023   Hyperlipidemia 12/24/2023   Stroke determined by clinical assessment (HCC) 12/22/2023   Mild persistent asthma without complication 06/16/2017   Seasonal and perennial allergic rhinitis 04/29/2014   Pulmonary embolism (HCC) 10/28/2010   Extrinsic asthma 05/05/2010    ONSET DATE: 12/24/2023  REFERRING DIAG: I10 (ICD-10-CM) - Essential hypertension  THERAPY DIAG:  Muscle weakness (generalized)  Other abnormalities of gait and mobility  Unsteadiness on feet  Rationale for Evaluation and Treatment: Rehabilitation  SUBJECTIVE:                                                                                                                                                                                             SUBJECTIVE STATEMENT: Pt presents w/RW. States things have been "okay" since leaving hospital. Is slow and requires supervision for bathing and dressing, but is mostly able to  do independently. Pt states her LLE feels heavy, but her R hand and R leg frequently shake, especially at night. Pt states her speech has been impacted, as she has word-finding difficulty, but cognition was not affected. 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 No falls.    Pt accompanied by:  Husband, [name]   PERTINENT HISTORY: SVT, HTN, PE, asthma, hypothyroidism, PFO, stroke-like symptoms on 12/22/23   PAIN:  Are you having pain? No and pt reports occasional sinus pain and neck pain   PRECAUTIONS:  Fall  RED FLAGS: None   WEIGHT BEARING RESTRICTIONS: No  FALLS: Has patient fallen in last 6 months? No  LIVING ENVIRONMENT: Lives with: lives with their family and lives with their spouse Lives in: House/apartment Stairs:  No STE, 2 floor house but pt able to live on first floor  Has following equipment at home: Otho Blitz - 2 wheeled, shower chair, and Grab bars  PLOF: Independent - is a Midwife.   PATIENT GOALS: " To strengthen my L side back up and no walker and to go back to normal"   OBJECTIVE:  Note: Objective measures were completed at Evaluation unless otherwise noted.  DIAGNOSTIC FINDINGS: MRI of brain from 12/23/23  IMPRESSION: 1. No acute intracranial abnormality. 2. Single subcortical T2 hyperintensity in the left parietal lobe. The finding is nonspecific but can be seen in the setting of chronic microvascular ischemia, a demyelinating process such as multiple sclerosis, vasculitis, complicated migraine headaches, or as the sequelae of a prior infectious or inflammatory process. 3. 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 The tonsils are somewhat pointed and mild crowding of the foramen magnum is present.   COGNITION: Overall cognitive status:  Difficulty word finding - delayed speech    SENSATION: Pt reports numbness in L face and L leg    COORDINATION: Heel to shin: WNL on RLE, unable to obtain full ROM on LLE but no ataxia noted  Finger to nose test: WNL on R, slowed but accurate on L   MUSCLE TONE: RLE: Pt reports frequent "shaking" in R hemibody w/fatigue    POSTURE: No Significant postural limitations  LOWER EXTREMITY ROM:     Active  Right Eval Left Eval  Hip flexion    Hip extension    Hip abduction    Hip adduction    Hip internal rotation    Hip external rotation    Knee flexion    Knee extension    Ankle dorsiflexion    Ankle plantarflexion    Ankle inversion    Ankle eversion     (Blank rows = not tested)  LOWER EXTREMITY MMT:  Tested in seated position   MMT Right Eval Left Eval  Hip flexion 5 2+  Hip extension    Hip abduction 5 3-  Hip adduction 5 2+  Hip internal rotation    Hip external rotation    Knee flexion 5 3-  Knee extension 5 3-  Ankle dorsiflexion 5 2+  Ankle plantarflexion    Ankle inversion    Ankle eversion    (Blank rows = not tested)  BED MOBILITY:  Independent per pt   TRANSFERS: Sit to stand: SBA  Assistive device utilized: Environmental consultant - 2 wheeled     Stand to sit: SBA  Assistive device utilized: Environmental consultant - 2 wheeled     Very slow initiation to stand, poor eccentric control w/reliance on BUE support to descend, weight shift to R   RAMP:  Not tested  CURB:  Not tested  STAIRS: Not tested GAIT: Gait pattern: step to pattern, decreased step length- Left, decreased stride length, decreased hip/knee flexion- Left, decreased ankle dorsiflexion- Left, lateral hip instability, lateral lean- Right, and poor foot clearance- Left Distance walked: various clinic distances  Assistive device utilized: Walker - 2 wheeled Level of assistance: SBA Comments: Pt maintains LLE in extension w/heavy lean to R side    FUNCTIONAL TESTS:   [facility] PT Assessment - 12/29/23  1006       Transfers   Five time sit to stand comments  92.28s   Poor eccentric control, BUE support,  weight shift to R                                                                                                                                       TREATMENT:    Self-care/home management  Provided education regarding Chiari malformation and plan to see neurosurgeon. Pt unaware of this, so spent time explaining pt's MRI and side effects of Chiari. All questions within PT's scope were addressed.  Pt's husband inquiring about if pt can travel on an airplane safely, so deferred question to neurologist but balance-wise, pt will be fine to get onto plane.  Educated pt and husband on Bioness L300 and pt in agreement to trial in PT.  Encouraged pt to work on sit to stands at home without UE support for LLE strengthening and endurance.    PATIENT EDUCATION: Education details: POC, eval findings, see self-care section  Person educated: Patient and Spouse Education method: Explanation and Demonstration Education comprehension: verbalized understanding and needs further education  HOME EXERCISE PROGRAM: To be established   GOALS: Goals reviewed with patient? Yes  SHORT TERM GOALS: Target date: 01/26/2024   Pt will be independent with initial HEP for improved strength, balance, transfers and gait.  Baseline: not established on eval   Goal status: INITIAL  2.  Pt will improve 5 x STS to less than or equal to 60 seconds w/no UE support to demonstrate improved functional strength and transfer efficiency.   Baseline: 92.28s Goal status: INITIAL  3.  to be assessed and STG/LTG updated  Baseline:  Goal status: INITIAL  4.  FGA to be assessed and LTG updated  Baseline:  Goal status: INITIAL    LONG TERM GOALS: Target date: 02/09/2024   Pt will be independent with final gym-based HEP for improved strength, balance, transfers and gait.  Baseline:  Goal status: INITIAL  2.  Pt will improve 5 x STS to less than or equal to 30 seconds w/no UE support to demonstrate  improved functional strength and transfer efficiency.   Baseline: 92.28s w/BUE support  Goal status: INITIAL  3.  goal  Baseline:  Goal status: INITIAL  4.  FGA goal  Baseline:  Goal status: INITIAL   ASSESSMENT:  CLINICAL IMPRESSION: Patient is a 43 year old female referred to Neuro OPPT for hypertension and stroke-like symptoms  Pt's PMH is significant for: SVT, HTN, PE, asthma, hypothyroidism and PFO. The following deficits were present during the exam: impaired sensation, decreased functional strength, impaired balance and impaired gait kinematics. Based on 5x STS, pt has severe functional limitations that increase her risk of falls. Pt would benefit from skilled PT to address these impairments and functional limitations to maximize functional mobility independence.    OBJECTIVE IMPAIRMENTS: Abnormal  gait, cardiopulmonary status limiting activity, decreased activity tolerance, decreased coordination, decreased knowledge of use of DME, decreased mobility, difficulty walking, decreased strength, impaired sensation, impaired tone, impaired UE functional use, improper body mechanics, and pain.   ACTIVITY LIMITATIONS: carrying, lifting, squatting, stairs, bathing, locomotion level, and caring for others  PARTICIPATION LIMITATIONS: meal prep, cleaning, laundry, interpersonal relationship, driving, shopping, community activity, occupation, and yard work  PERSONAL FACTORS: Fitness, Past/current experiences, and 1-2 comorbidities: Chiari I malformation and   are also affecting patient's functional outcome.   REHAB POTENTIAL: Good  CLINICAL DECISION MAKING: Evolving/moderate complexity  EVALUATION COMPLEXITY: Moderate  PLAN:  PT FREQUENCY: 1-2x/week (due to copay)  PT DURATION: 6 weeks  PLANNED INTERVENTIONS: 16109- PT Re-evaluation, 97750- Physical Performance Testing, 97110-Therapeutic exercises, 97530- Therapeutic activity, [id number]- Neuromuscular re-education, 97535- Self  Care, 60454- Manual therapy, [id number]- Gait training, [id number]- Orthotic Initial, [phone]- Orthotic/Prosthetic subsequent, [phone]- Electrical stimulation (manual), Patient/Family education, Balance training, Stair training, Dry Needling, Joint mobilization, Spinal mobilization, Vestibular training, and DME instructions  PLAN FOR NEXT SESSION: Monitor vitals, Bioness to L anterior tib and hamstring (quads are also weak); FGA and and update goal.    Lennan Malone E Alexiya Franqui, PT, DPT 12/29/2023, 10:26 AM

## 2024-01-01 ENCOUNTER — Encounter: Payer: Self-pay | Admitting: Neurology

## 2024-01-02 ENCOUNTER — Ambulatory Visit: Attending: Cardiology | Admitting: Cardiology

## 2024-01-02 ENCOUNTER — Encounter: Payer: Self-pay | Admitting: Cardiology

## 2024-01-02 VITALS — BP 111/78 | HR 88 | Resp 16 | Ht 64.0 in | Wt 161.0 lb

## 2024-01-02 DIAGNOSIS — J4520 Mild intermittent asthma, uncomplicated: Secondary | ICD-10-CM

## 2024-01-02 DIAGNOSIS — G8194 Hemiplegia, unspecified affecting left nondominant side: Secondary | ICD-10-CM

## 2024-01-02 DIAGNOSIS — I10 Essential (primary) hypertension: Secondary | ICD-10-CM | POA: Diagnosis not present

## 2024-01-02 DIAGNOSIS — Q2112 Patent foramen ovale: Secondary | ICD-10-CM

## 2024-01-02 DIAGNOSIS — Z01812 Encounter for preprocedural laboratory examination: Secondary | ICD-10-CM

## 2024-01-02 NOTE — Progress Notes (Signed)
 Cardiology Office Note:  .   Date:  01/02/2024  ID:  Laurance Pollen, DOB 1981/02/21, MRN [id number] PCP: Merl Star, MD  [facility] Providers Cardiologist:  Knox Perl, MD   History of Present Illness: .   Kelsey Friedman is a 43 y.o. African-American female with hypertension presented to the emergency room on 12/22/2023 with chest discomfort (felt funny) followed by headache and left facial droop then then progressed tod left hemiparesis and numbness and dysarthria diagnosed withembolic  stroke clinically although MRI was negative, received TNK, with improvement in deficits.    She was started on aspirin  along with Plavix  and discharged home.  Echocardiogram also revealed PFO with right-to-left shunting, transcranial bubble study was positive for PFO as well with Spencer 3-4 shunting at rest.  She is now referred for evaluation and management of cardiac risk factors and consideration for atrial septal defect repair.  Discussed the use of AI scribe software for clinical note transcription with the patient, who gave verbal consent to proceed.  History of Present Illness Kelsey Friedman is a 43 year old female with a recent stroke who presents for evaluation of a patent foramen ovale (PFO). She is accompanied by her husband, [name]. She experienced a stroke on December 22, 2023, with symptoms of tachycardia, left facial droop, left arm weakness and numbness, and dysarthria. Left leg weakness persists, and she plans to start physical therapy. She received TNK, which improved her symptoms, and is currently on Plavix  and aspirin . An MRI showed no damage.  A transcranial bubble study and echocardiogram confirmed a PFO. She was at work when she experienced the stroke, initially feeling chest discomfort followed by arm weakness. She sought medical attention within ten minutes.  She has hypertension, managed with diltiazem , losartan, and hydrochlorothiazide prior to the stroke, with a  cholesterol-lowering drug added post-stroke. She uses albuterol  for intermittent asthma, approximately once a week.  Her father had a myocardial infarction at 49 and a massive heart attack at 75. Her sister, with a different father, had a stroke a few years ago. She does not smoke and consumes alcohol socially.  Labs   Lab Results  Component Value Date   CHOL 163 12/23/2023   HDL 45 12/23/2023   LDLCALC 103 (H) 12/23/2023   TRIG 75 12/23/2023   CHOLHDL 3.6 12/23/2023   Lab Results  Component Value Date   NA 137 12/24/2023   K 3.5 12/24/2023   CO2 20 (L) 12/24/2023   GLUCOSE 95 12/24/2023   BUN 11 12/24/2023   CREATININE 0.76 12/24/2023   CALCIUM  8.9 12/24/2023   GFRNONAA >60 12/24/2023      Latest Ref Rng & Units 12/24/2023    7:13 AM 12/22/2023   10:54 AM 12/22/2023   10:50 AM  BMP  Glucose 70 - 99 mg/dL 95  93  96   BUN 6 - 20 mg/dL 11  10  10    Creatinine 0.44 - 1.00 mg/dL 3.24  4.01  0.27   Sodium 135 - 145 mmol/L 137  139  137   Potassium 3.5 - 5.1 mmol/L 3.5  3.3  3.2   Chloride 98 - 111 mmol/L 106  106  105   CO2 22 - 32 mmol/L 20   20   Calcium  8.9 - 10.3 mg/dL 8.9   9.2       Latest Ref Rng & Units 12/24/2023    2:02 PM 12/22/2023   10:54 AM 12/22/2023   10:50 AM  CBC  WBC 4.0 - 10.5 K/uL 9.7   8.9   Hemoglobin 12.0 - 15.0 g/dL 04.5  40.9  81.1   Hematocrit 36.0 - 46.0 % 33.4  33.0  33.2   Platelets 150 - 400 K/uL 431   410    Lab Results  Component Value Date   HGBA1C 4.9 12/22/2023    TSH 1.760 11/10/2023  ROS  Review of Systems  Cardiovascular:  Negative for chest pain, dyspnea on exertion, leg swelling and syncope.  Neurological:  Positive for disturbances in coordination and focal weakness (left hemiplegia).    Physical Exam:   VS:  BP 111/78 (BP Location: Left Arm, Patient Position: Sitting, Cuff Size: Normal)   Pulse 88   Resp 16   Ht 5\' 4"  (1.626 m)   Wt 161 lb (73 kg)   SpO2 98%   BMI 27.64 kg/m    Wt Readings from Last 3 Encounters:   01/02/24 161 lb (73 kg)  12/22/23 158 lb (71.7 kg)  09/22/22 175 lb (79.4 kg)    Physical Exam Constitutional:      General: She is not in acute distress.    Appearance: Normal appearance.  Neck:     Vascular: No carotid bruit or JVD.  Cardiovascular:     Rate and Rhythm: Normal rate and regular rhythm.     Pulses: Intact distal pulses.     Heart sounds: Normal heart sounds. No murmur heard.    No gallop.  Pulmonary:     Effort: Pulmonary effort is normal.     Breath sounds: Normal breath sounds.  Abdominal:     General: Bowel sounds are normal.     Palpations: Abdomen is soft.  Musculoskeletal:     Right lower leg: No edema.     Left lower leg: No edema.  Neurological:     Mental Status: She is alert.     Comments: Left hemiparesis    Studies Reviewed: Aaron Aas    CARDIAC EVENT MONITOR 08/03/2022   Narrative Rhythm:  Sinus rhythm/Sinus tachycardia  Rare PAC, PVC One short burst SVT approximately 20 seconds  (150 bpm)   Not detected Triggered events correlated with sinus rhythm, sinus tachycardia  One with SR with PAC.  ECHOCARDIOGRAM COMPLETE BUBBLE STUDY 12/23/2023  1. Left ventricular ejection fraction, by estimation, is 60 to 65%. The left ventricle has normal function. The left ventricle has no regional wall motion abnormalities. Left ventricular diastolic parameters were normal. 2. Right ventricular systolic function is normal. The right ventricular size is normal. Tricuspid regurgitation signal is inadequate for assessing PA pressure. 3. The mitral valve is normal in structure. Trivial mitral valve regurgitation. No evidence of mitral stenosis. 4. The aortic valve is normal in structure. Aortic valve regurgitation is not visualized. No aortic stenosis is present. 5. Aortic dilatation noted. There is borderline dilatation of the ascending aorta, measuring 39 mm. 6. The inferior vena cava is normal in size with greater than 50% respiratory variability, suggesting right  atrial pressure of 3 mmHg. 7. Evidence of atrial level shunting detected by color flow Doppler. Agitated saline contrast bubble study was positive with shunting observed within 3-6 cardiac cycles suggestive of interatrial shunt.   EKG:   EKG 12/22/2023: Normal sinus rhythm at a rate of 80 bpm, normal EKG.  Medications and allergies    No Known Allergies   Current Outpatient Medications:    albuterol  (PROAIR  HFA) 108 (90 Base) MCG/ACT inhaler, Inhale 2 puffs into the lungs every 4 (four) hours  as needed for wheezing or shortness of breath., Disp: 1 Inhaler, Rfl: 1   aspirin  EC 81 MG tablet, Take 1 tablet (81 mg total) by mouth daily. Swallow whole., Disp: 30 tablet, Rfl: 12   atorvastatin  (LIPITOR) 40 MG tablet, Take 1 tablet (40 mg total) by mouth daily., Disp: 60 tablet, Rfl: 0   cetirizine (ZYRTEC) 10 MG tablet, Take 10 mg by mouth daily., Disp: , Rfl:    clopidogrel  (PLAVIX ) 75 MG tablet, Take 1 tablet (75 mg total) by mouth daily., Disp: 21 tablet, Rfl: 0   diltiazem  (CARDIZEM  CD) 120 MG 24 hr capsule, Take 1 capsule (120 mg total) by mouth daily., Disp: 90 capsule, Rfl: 3   escitalopram  (LEXAPRO ) 20 MG tablet, Take 20 mg by mouth daily., Disp: , Rfl:    levothyroxine  (SYNTHROID ) 75 MCG tablet, Take 75 mcg by mouth every morning., Disp: , Rfl:    losartan-hydrochlorothiazide (HYZAAR) 50-12.5 MG tablet, Take 1 tablet by mouth daily., Disp: , Rfl:    Multiple Vitamin (MULTIVITAMIN) tablet, Take 1 tablet by mouth daily., Disp: , Rfl:    No orders of the defined types were placed in this encounter.    Medications Discontinued During This Encounter  Medication Reason   Fluticasone  Furoate (ARNUITY ELLIPTA ) 100 MCG/ACT AEPB Patient Preference     ASSESSMENT AND PLAN: .      ICD-10-CM   1. Left hemiparesis (HCC)  G81.94 EKG 12-Lead    Basic metabolic panel with GFR    CBC w/Diff    CBC w/Diff    Basic metabolic panel with GFR    2. PFO (patent foramen ovale)  Q21.12 EKG 12-Lead     Basic metabolic panel with GFR    CBC w/Diff    CBC w/Diff    Basic metabolic panel with GFR    3. Primary hypertension  I10 EKG 12-Lead    Basic metabolic panel with GFR    CBC w/Diff    CBC w/Diff    Basic metabolic panel with GFR    4. Mild intermittent reactive airway disease without complication  J45.20 EKG 12-Lead    Basic metabolic panel with GFR    CBC w/Diff    CBC w/Diff    Basic metabolic panel with GFR    5. 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 Symptoms improved with TNK administration, but some weakness remains. MRI showed no permanent damage, suggesting potential for full recovery. She is on clopidogrel  and aspirin  for stroke prevention. Continue clopidogrel  and aspirin  until further notice.  Patent Foramen Ovale (PFO)   Echocardiogram also revealed PFO with right-to-left shunting, transcranial bubble study was positive for PFO as well with Spencer 3-4 shunting at rest.    A PFO was identified as the likely cause of the embolic stroke. She is an ideal candidate for PFO closure due to the recent stroke and shunting observed on echocardiogram. Risks of the procedure include stroke, bleeding, infection, and device misplacement, but the overall risk is less than 1%. The procedure is planned for approximately three weeks from now to allow for brain recovery post-stroke. Informed consent has been obtained, including a discussion of procedure details, risks, and benefits. Schedule the PFO closure procedure in approximately three weeks.  Continue clopidogrel  and aspirin  until PFO closure. Patient is young with well-controlled hypertension otherwise no other significant cardiovascular risk factors, gainfully employed as a Midwife, mildly  overweight but no obesity or sleep apnea, very appropriate patient for consideration for PFO closure.  Will inform Dr. Janett Medin (Neurology), certainly would change my decision if she feels closure is not appropriate.  Hypertension   Her hypertension is well-controlled with the current medication regimen. She is on diltiazem  and losartan hydrochlorothiazide.  Intermittent Asthma   Intermittent asthma is managed with albuterol  as needed, approximately once a week. There have been no recent exacerbations since the stroke. Asthma may influence right heart pressures, relevant to PFO management.  Travel and Activity   There are no restrictions on travel or activities post-stroke. She can fly and travel with precautions. Advise her to wear compression stockings and walk during flights. Ensure she continues aspirin  and clopidogrel  during travel.  Her husband is present and all questions answered.  Her records were reviewed. Signed,  Knox Perl, MD, [facility] 01/02/2024, 6:47 PM [facility] [address] [place], [place] [address] Phone: [phone]. Fax:  [phone]

## 2024-01-02 NOTE — H&P (View-Only) (Signed)
 Cardiology Office Note:  .   Date:  01/02/2024  ID:  Kelsey Friedman, DOB 1981/02/21, MRN [id number] PCP: Merl Star, MD  [facility] Providers Cardiologist:  Knox Perl, MD   History of Present Illness: .   Kelsey Friedman is a 43 y.o. African-American female with hypertension presented to the emergency room on 12/22/2023 with chest discomfort (felt funny) followed by headache and left facial droop then then progressed tod left hemiparesis and numbness and dysarthria diagnosed withembolic  stroke clinically although MRI was negative, received TNK, with improvement in deficits.    She was started on aspirin  along with Plavix  and discharged home.  Echocardiogram also revealed PFO with right-to-left shunting, transcranial bubble study was positive for PFO as well with Spencer 3-4 shunting at rest.  She is now referred for evaluation and management of cardiac risk factors and consideration for atrial septal defect repair.  Discussed the use of AI scribe software for clinical note transcription with the patient, who gave verbal consent to proceed.  History of Present Illness Kelsey Friedman is a 43 year old female with a recent stroke who presents for evaluation of a patent foramen ovale (PFO). She is accompanied by her husband, [name]. She experienced a stroke on December 22, 2023, with symptoms of tachycardia, left facial droop, left arm weakness and numbness, and dysarthria. Left leg weakness persists, and she plans to start physical therapy. She received TNK, which improved her symptoms, and is currently on Plavix  and aspirin . An MRI showed no damage.  A transcranial bubble study and echocardiogram confirmed a PFO. She was at work when she experienced the stroke, initially feeling chest discomfort followed by arm weakness. She sought medical attention within ten minutes.  She has hypertension, managed with diltiazem , losartan, and hydrochlorothiazide prior to the stroke, with a  cholesterol-lowering drug added post-stroke. She uses albuterol  for intermittent asthma, approximately once a week.  Her father had a myocardial infarction at 49 and a massive heart attack at 75. Her sister, with a different father, had a stroke a few years ago. She does not smoke and consumes alcohol socially.  Labs   Lab Results  Component Value Date   CHOL 163 12/23/2023   HDL 45 12/23/2023   LDLCALC 103 (H) 12/23/2023   TRIG 75 12/23/2023   CHOLHDL 3.6 12/23/2023   Lab Results  Component Value Date   NA 137 12/24/2023   K 3.5 12/24/2023   CO2 20 (L) 12/24/2023   GLUCOSE 95 12/24/2023   BUN 11 12/24/2023   CREATININE 0.76 12/24/2023   CALCIUM  8.9 12/24/2023   GFRNONAA >60 12/24/2023      Latest Ref Rng & Units 12/24/2023    7:13 AM 12/22/2023   10:54 AM 12/22/2023   10:50 AM  BMP  Glucose 70 - 99 mg/dL 95  93  96   BUN 6 - 20 mg/dL 11  10  10    Creatinine 0.44 - 1.00 mg/dL 3.24  4.01  0.27   Sodium 135 - 145 mmol/L 137  139  137   Potassium 3.5 - 5.1 mmol/L 3.5  3.3  3.2   Chloride 98 - 111 mmol/L 106  106  105   CO2 22 - 32 mmol/L 20   20   Calcium  8.9 - 10.3 mg/dL 8.9   9.2       Latest Ref Rng & Units 12/24/2023    2:02 PM 12/22/2023   10:54 AM 12/22/2023   10:50 AM  CBC  WBC 4.0 - 10.5 K/uL 9.7   8.9   Hemoglobin 12.0 - 15.0 g/dL 04.5  40.9  81.1   Hematocrit 36.0 - 46.0 % 33.4  33.0  33.2   Platelets 150 - 400 K/uL 431   410    Lab Results  Component Value Date   HGBA1C 4.9 12/22/2023    TSH 1.760 11/10/2023  ROS  Review of Systems  Cardiovascular:  Negative for chest pain, dyspnea on exertion, leg swelling and syncope.  Neurological:  Positive for disturbances in coordination and focal weakness (left hemiplegia).    Physical Exam:   VS:  BP 111/78 (BP Location: Left Arm, Patient Position: Sitting, Cuff Size: Normal)   Pulse 88   Resp 16   Ht 5\' 4"  (1.626 m)   Wt 161 lb (73 kg)   SpO2 98%   BMI 27.64 kg/m    Wt Readings from Last 3 Encounters:   01/02/24 161 lb (73 kg)  12/22/23 158 lb (71.7 kg)  09/22/22 175 lb (79.4 kg)    Physical Exam Constitutional:      General: She is not in acute distress.    Appearance: Normal appearance.  Neck:     Vascular: No carotid bruit or JVD.  Cardiovascular:     Rate and Rhythm: Normal rate and regular rhythm.     Pulses: Intact distal pulses.     Heart sounds: Normal heart sounds. No murmur heard.    No gallop.  Pulmonary:     Effort: Pulmonary effort is normal.     Breath sounds: Normal breath sounds.  Abdominal:     General: Bowel sounds are normal.     Palpations: Abdomen is soft.  Musculoskeletal:     Right lower leg: No edema.     Left lower leg: No edema.  Neurological:     Mental Status: She is alert.     Comments: Left hemiparesis    Studies Reviewed: Aaron Aas    CARDIAC EVENT MONITOR 08/03/2022   Narrative Rhythm:  Sinus rhythm/Sinus tachycardia  Rare PAC, PVC One short burst SVT approximately 20 seconds  (150 bpm)   Not detected Triggered events correlated with sinus rhythm, sinus tachycardia  One with SR with PAC.  ECHOCARDIOGRAM COMPLETE BUBBLE STUDY 12/23/2023  1. Left ventricular ejection fraction, by estimation, is 60 to 65%. The left ventricle has normal function. The left ventricle has no regional wall motion abnormalities. Left ventricular diastolic parameters were normal. 2. Right ventricular systolic function is normal. The right ventricular size is normal. Tricuspid regurgitation signal is inadequate for assessing PA pressure. 3. The mitral valve is normal in structure. Trivial mitral valve regurgitation. No evidence of mitral stenosis. 4. The aortic valve is normal in structure. Aortic valve regurgitation is not visualized. No aortic stenosis is present. 5. Aortic dilatation noted. There is borderline dilatation of the ascending aorta, measuring 39 mm. 6. The inferior vena cava is normal in size with greater than 50% respiratory variability, suggesting right  atrial pressure of 3 mmHg. 7. Evidence of atrial level shunting detected by color flow Doppler. Agitated saline contrast bubble study was positive with shunting observed within 3-6 cardiac cycles suggestive of interatrial shunt.   EKG:   EKG 12/22/2023: Normal sinus rhythm at a rate of 80 bpm, normal EKG.  Medications and allergies    No Known Allergies   Current Outpatient Medications:    albuterol  (PROAIR  HFA) 108 (90 Base) MCG/ACT inhaler, Inhale 2 puffs into the lungs every 4 (four) hours  as needed for wheezing or shortness of breath., Disp: 1 Inhaler, Rfl: 1   aspirin  EC 81 MG tablet, Take 1 tablet (81 mg total) by mouth daily. Swallow whole., Disp: 30 tablet, Rfl: 12   atorvastatin  (LIPITOR) 40 MG tablet, Take 1 tablet (40 mg total) by mouth daily., Disp: 60 tablet, Rfl: 0   cetirizine (ZYRTEC) 10 MG tablet, Take 10 mg by mouth daily., Disp: , Rfl:    clopidogrel  (PLAVIX ) 75 MG tablet, Take 1 tablet (75 mg total) by mouth daily., Disp: 21 tablet, Rfl: 0   diltiazem  (CARDIZEM  CD) 120 MG 24 hr capsule, Take 1 capsule (120 mg total) by mouth daily., Disp: 90 capsule, Rfl: 3   escitalopram  (LEXAPRO ) 20 MG tablet, Take 20 mg by mouth daily., Disp: , Rfl:    levothyroxine  (SYNTHROID ) 75 MCG tablet, Take 75 mcg by mouth every morning., Disp: , Rfl:    losartan-hydrochlorothiazide (HYZAAR) 50-12.5 MG tablet, Take 1 tablet by mouth daily., Disp: , Rfl:    Multiple Vitamin (MULTIVITAMIN) tablet, Take 1 tablet by mouth daily., Disp: , Rfl:    No orders of the defined types were placed in this encounter.    Medications Discontinued During This Encounter  Medication Reason   Fluticasone  Furoate (ARNUITY ELLIPTA ) 100 MCG/ACT AEPB Patient Preference     ASSESSMENT AND PLAN: .      ICD-10-CM   1. Left hemiparesis (HCC)  G81.94 EKG 12-Lead    Basic metabolic panel with GFR    CBC w/Diff    CBC w/Diff    Basic metabolic panel with GFR    2. PFO (patent foramen ovale)  Q21.12 EKG 12-Lead     Basic metabolic panel with GFR    CBC w/Diff    CBC w/Diff    Basic metabolic panel with GFR    3. Primary hypertension  I10 EKG 12-Lead    Basic metabolic panel with GFR    CBC w/Diff    CBC w/Diff    Basic metabolic panel with GFR    4. Mild intermittent reactive airway disease without complication  J45.20 EKG 12-Lead    Basic metabolic panel with GFR    CBC w/Diff    CBC w/Diff    Basic metabolic panel with GFR    5. 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 Symptoms improved with TNK administration, but some weakness remains. MRI showed no permanent damage, suggesting potential for full recovery. She is on clopidogrel  and aspirin  for stroke prevention. Continue clopidogrel  and aspirin  until further notice.  Patent Foramen Ovale (PFO)   Echocardiogram also revealed PFO with right-to-left shunting, transcranial bubble study was positive for PFO as well with Spencer 3-4 shunting at rest.    A PFO was identified as the likely cause of the embolic stroke. She is an ideal candidate for PFO closure due to the recent stroke and shunting observed on echocardiogram. Risks of the procedure include stroke, bleeding, infection, and device misplacement, but the overall risk is less than 1%. The procedure is planned for approximately three weeks from now to allow for brain recovery post-stroke. Informed consent has been obtained, including a discussion of procedure details, risks, and benefits. Schedule the PFO closure procedure in approximately three weeks.  Continue clopidogrel  and aspirin  until PFO closure. Patient is young with well-controlled hypertension otherwise no other significant cardiovascular risk factors, gainfully employed as a Midwife, mildly  overweight but no obesity or sleep apnea, very appropriate patient for consideration for PFO closure.  Will inform Dr. Janett Medin (Neurology), certainly would change my decision if she feels closure is not appropriate.  Hypertension   Her hypertension is well-controlled with the current medication regimen. She is on diltiazem  and losartan hydrochlorothiazide.  Intermittent Asthma   Intermittent asthma is managed with albuterol  as needed, approximately once a week. There have been no recent exacerbations since the stroke. Asthma may influence right heart pressures, relevant to PFO management.  Travel and Activity   There are no restrictions on travel or activities post-stroke. She can fly and travel with precautions. Advise her to wear compression stockings and walk during flights. Ensure she continues aspirin  and clopidogrel  during travel.  Her husband is present and all questions answered.  Her records were reviewed. Signed,  Knox Perl, MD, [facility] 01/02/2024, 6:47 PM [facility] [address] [place], [place] [address] Phone: [phone]. Fax:  [phone]

## 2024-01-02 NOTE — Patient Instructions (Addendum)
 Medication Instructions:  Your physician recommends that you continue on your current medications as directed. Please refer to the Current Medication list given to you today.  *If you need a refill on your cardiac medications before your next appointment, please call your pharmacy*  Lab Work:  TODAY--BMET AND CBC W DIFF--PRE-PROCEDURE LAB--DOWNSTAIRS FIRST FLOOR   Testing/Procedures:  CLOSURE INSTRUCTIONS: You are scheduled for a PFO/ASD CLOSURE on Tuesday, May 27 with Dr. Berry Bristol.  1. Please arrive at the [facility] (Main Entrance A) at [facility]: [address] [place], [place] [address] at 7:00 AM (This time is two hours before your procedure to ensure your preparation). Free valet parking service is available. You are allowed TWO visitors in the waiting room during your procedure. Both you and your guests must wear masks upon arrival. Special note: Every effort is made to have your procedure done on time. Please understand that emergencies sometimes delay scheduled procedures.  2. Diet: Make sure to stay well hydrated the day before your procedure! Do not eat solid foods after midnight.  You may have clear liquids until 5am upon the day of the procedure.  3. Labs: You will need to have blood drawn (CBC, BMET within 30 days of procedure) on Monday, May 5. You do not need to be fasting.  4. Medication instructions in preparation for your procedure:   1) MAKE SURE TO TAKE YOUR ASPIRIN  AND PLAVIX  the morning of your closure  2) Hold diuretics the morning of procedure.  PLEASE HOLD YOUR LOSARTAN-HYDROCHLOROTHIAZIDE (HYZAAR) THE MORNING OF THIS PROCEDURE  3) Other meds may be taken as directed with a sip of water.    5. Plan for one night stay--bring personal belongings (this is a disclaimer, not an intention. We want you to go home!). 6. Bring a current list of your medications and current insurance cards. 7. You MUST have a responsible person to drive you home. 8. Someone  MUST be with you the first 24 hours after you arrive home or your discharge will be delayed. 9. Please wear clothes that are easy to get on and off and wear slip-on shoes.   FOLLOW-UP APPOINTMENT: You are scheduled for your follow-up appointment-- we will call to schedule your follow-up appointment    Follow-Up: At [facility], you and your health needs are our priority.  As part of our continuing mission to provide you with exceptional heart care, our providers are all part of one team.  This team includes your primary Cardiologist (physician) and Advanced Practice Providers or APPs (Physician Assistants and Nurse Practitioners) who all work together to provide you with the care you need, when you need it.  Your next appointment:   Will be determined after procedure.  The format for your next appointment:   In Person  Provider:   Knox Perl, MD{  We recommend signing up for the patient portal called "MyChart".  Sign up information is provided on this After Visit Summary.  MyChart is used to connect with patients for Virtual Visits (Telemedicine).  Patients are able to view lab/test results, encounter notes, upcoming appointments, etc.  Non-urgent messages can be sent to your provider as well.   To learn more about what you can do with MyChart, go to [email or web address].   Other Instructions

## 2024-01-03 ENCOUNTER — Encounter: Payer: Self-pay | Admitting: Cardiology

## 2024-01-03 LAB — BASIC METABOLIC PANEL WITH GFR
BUN/Creatinine Ratio: 14 (ref 9–23)
BUN: 11 mg/dL (ref 6–24)
CO2: 22 mmol/L (ref 20–29)
Calcium: 9.8 mg/dL (ref 8.7–10.2)
Chloride: 106 mmol/L (ref 96–106)
Creatinine, Ser: 0.8 mg/dL (ref 0.57–1.00)
Glucose: 79 mg/dL (ref 70–99)
Potassium: 4.2 mmol/L (ref 3.5–5.2)
Sodium: 143 mmol/L (ref 134–144)
eGFR: 93 mL/min/{1.73_m2} (ref >59)

## 2024-01-03 LAB — CBC WITH DIFFERENTIAL/PLATELET
Basophils Absolute: 0.1 10*3/uL (ref 0.0–0.2)
Basos: 1 %
EOS (ABSOLUTE): 0.1 10*3/uL (ref 0.0–0.4)
Eos: 1 %
Hematocrit: 37.6 % (ref 34.0–46.6)
Hemoglobin: 11.1 g/dL (ref 11.1–15.9)
Immature Grans (Abs): 0 10*3/uL (ref 0.0–0.1)
Immature Granulocytes: 0 %
Lymphocytes Absolute: 1.8 10*3/uL (ref 0.7–3.1)
Lymphs: 26 %
MCH: 22.4 pg — ABNORMAL LOW (ref 26.6–33.0)
MCHC: 29.5 g/dL — ABNORMAL LOW (ref 31.5–35.7)
MCV: 76 fL — ABNORMAL LOW (ref 79–97)
Monocytes Absolute: 0.5 10*3/uL (ref 0.1–0.9)
Monocytes: 7 %
Neutrophils Absolute: 4.6 10*3/uL (ref 1.4–7.0)
Neutrophils: 65 %
Platelets: 543 10*3/uL — ABNORMAL HIGH (ref 150–450)
RBC: 4.96 x10E6/uL (ref 3.77–5.28)
RDW: 16.9 % — ABNORMAL HIGH (ref 11.7–15.4)
WBC: 7.1 10*3/uL (ref 3.4–10.8)

## 2024-01-03 NOTE — Telephone Encounter (Signed)
 Spoke w/Pt regarding reported HA and neck stiffness. Pt stated she did not discuss this at her cardiology visit yesterday. Pt states she will experience the HA 3-4 times/day and it doesn't matter whether she is active or just sitting when they start, same for the neck tightness. Pt stated she takes 2 tabs of Tylenol  500mg  and this helps some for both HA and neck tightness. Pt will be having a cardiology procedure on 5/27 and she would like to be seen either before that or sometime before her scheduled appt on 03/07/24. Pt will not be available for an appt from 5/27 to 6/6 following her procedure. Discussed w/Pt we will add her to the cancellation list for an available appt starting 6/9 or after. Pt stated understanding and thankful for the call back.

## 2024-01-03 NOTE — Progress Notes (Signed)
 Your labs are stable to proceed with cardiac catheterization.  Upon review of your labs, you have microcytic indicis that suggest severe iron deficiency anemia.  You may want to follow-up with Dr. Merl Star about further management.

## 2024-01-05 ENCOUNTER — Telehealth: Payer: Self-pay

## 2024-01-05 DIAGNOSIS — I639 Cerebral infarction, unspecified: Secondary | ICD-10-CM

## 2024-01-05 NOTE — Transitions of Care (Post Inpatient/ED Visit) (Signed)
 Stroke Discharge Follow-up   01/05/2024 Name:  Kelsey Friedman MRN:  [id number] DOB:  1980-11-05  Subjective: Kelsey Friedman is a 43 y.o. year old female who is a primary care patient of Polite, Currie Douse, MD An Emmi alert was received indicating patient responded to questions: Sad, hopeless, anxious, or empty?. I reached out by phone to follow up on the alert and spoke to Patient.  Care Management Interventions: Reviewed and assessed depression. Offered SW and patient declined.  Reviewed normal feelings since a change in life.  Encouraged patient to reach out to her therapist or PCP if needed.  Denies feeling depressed at this time. Reports she has all her medications and is taking as prescribed.   Follow up plan: No further intervention required.   Orpha Blade, RN, BSN, CEN [employer]- Transition of Care Team.  [facility] [phone]

## 2024-01-06 ENCOUNTER — Encounter: Payer: Self-pay | Admitting: Cardiology

## 2024-01-06 MED ORDER — CLOPIDOGREL BISULFATE 75 MG PO TABS: 75.0000 mg | ORAL_TABLET | Freq: Every day | ORAL | 3 refills | Status: DC

## 2024-01-06 NOTE — Telephone Encounter (Signed)
 Knox Perl, MD to Christine Cozier, RN    01/06/24 12:32 PM Please refill Plavix  90 tab 3 refills. Indication CAD   Refill request sent to the pts pharmacy of choice. Pt aware via [employer].

## 2024-01-09 ENCOUNTER — Ambulatory Visit: Admitting: Physical Therapy

## 2024-01-09 ENCOUNTER — Telehealth: Payer: Self-pay

## 2024-01-09 VITALS — BP 113/80 | HR 95

## 2024-01-09 DIAGNOSIS — M6281 Muscle weakness (generalized): Secondary | ICD-10-CM

## 2024-01-09 DIAGNOSIS — R2681 Unsteadiness on feet: Secondary | ICD-10-CM

## 2024-01-09 DIAGNOSIS — R2689 Other abnormalities of gait and mobility: Secondary | ICD-10-CM

## 2024-01-09 DIAGNOSIS — I639 Cerebral infarction, unspecified: Secondary | ICD-10-CM

## 2024-01-09 NOTE — Transitions of Care (Post Inpatient/ED Visit) (Signed)
   01/09/2024  Name: Kelsey Friedman MRN: [id number] DOB: 1980/10/05  Today's TOC FU Call Status: EMMI STROKE ATTEMPT #1    Attempted to reach the patient regarding the most recent Inpatient/ED visit.  Follow Up Plan: Additional outreach attempts will be made to reach the patient to complete the Transitions of Care (Post Inpatient/ED visit) call.   Lisa Milian J. Dayn Barich RN, MSN [facility], [facility] RN Care Manager Direct Dial: [phone]  Fax: [phone] Website: [email or web address]

## 2024-01-09 NOTE — Therapy (Signed)
 OUTPATIENT PHYSICAL THERAPY NEURO TREATMENT   Patient Name: Kelsey Friedman MRN: [id number] DOB:08-Sep-1980, 43 y.o., female Today's Date: 01/09/2024   PCP: Merl Star, MD REFERRING PROVIDER: Audrene Lease, NP  END OF SESSION:  PT End of Session - 01/09/24 1534     Visit Number 2    Number of Visits 13    Date for PT Re-Evaluation 02/16/24    Authorization Type [employer]    PT Start Time 1532    PT Stop Time 1617    PT Time Calculation (min) 45 min    Equipment Utilized During Treatment Gait belt    Activity Tolerance Patient tolerated treatment well    Behavior During Therapy WFL for tasks assessed/performed              Past Medical History:  Diagnosis Date   Asthma    Cerebral aneurysm    Chiari I malformation (HCC)    HLD (hyperlipidemia)    HTN (hypertension)    Hypothyroidism    PE (pulmonary embolism)    SOB (shortness of breath)    Past Surgical History:  Procedure Laterality Date   CHOLECYSTECTOMY     TONSILLECTOMY     Patient Active Problem List   Diagnosis Date Noted   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   History of panic attacks 12/24/2023   PFO (patent foramen ovale) 12/24/2023   Essential hypertension 12/24/2023   Hyperlipidemia 12/24/2023   Stroke determined by clinical assessment (HCC) 12/22/2023   Mild persistent asthma without complication 06/16/2017   Seasonal and perennial allergic rhinitis 04/29/2014   Pulmonary embolism (HCC) 10/28/2010   Extrinsic asthma 05/05/2010    ONSET DATE: 12/24/2023  REFERRING DIAG: I10 (ICD-10-CM) - Essential hypertension  THERAPY DIAG:  Unsteadiness on feet  Other abnormalities of gait and mobility  Muscle weakness (generalized)  Rationale for Evaluation and Treatment: Rehabilitation  SUBJECTIVE:                                                                                                                                                                                              SUBJECTIVE STATEMENT: Pt presents w/RW. Having some hip discomfort on R side today.    Pt accompanied by: Husband, [name]   PERTINENT HISTORY: SVT, HTN, PE, asthma, hypothyroidism, PFO, stroke-like symptoms on 12/22/23   PAIN:  Are you having pain? No and pt reports occasional sinus pain and neck pain   PRECAUTIONS: Fall  RED FLAGS: None   WEIGHT BEARING RESTRICTIONS: No  FALLS: Has patient fallen in last 6 months? 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   PATIENT GOALS: " To strengthen my L side back up and no walker and to go back to normal"   OBJECTIVE:  Note: Objective measures were completed at Evaluation unless otherwise noted.  DIAGNOSTIC FINDINGS: MRI of brain from 12/23/23  IMPRESSION: 1. No acute intracranial abnormality. 2. Single subcortical T2 hyperintensity in the left parietal lobe. The finding is nonspecific but can be seen in the setting of chronic microvascular ischemia, a demyelinating process such as multiple sclerosis, vasculitis, complicated migraine headaches, or as the sequelae of a prior infectious or inflammatory process. 3. 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 The tonsils are somewhat pointed and mild crowding of the foramen magnum is present.   COGNITION: Overall cognitive status: Difficulty word finding - delayed speech    SENSATION: Pt reports numbness in L face and L leg   COORDINATION: Heel to shin: WNL on RLE, unable to obtain full ROM on LLE but no ataxia noted  Finger to nose test: WNL on R, slowed but accurate on L   MUSCLE TONE: RLE: Pt reports frequent "shaking" in R hemibody w/fatigue     POSTURE: No Significant postural limitations  LOWER EXTREMITY ROM:     Active  Right Eval Left Eval  Hip flexion    Hip extension    Hip abduction    Hip adduction    Hip internal rotation    Hip external rotation    Knee flexion    Knee extension    Ankle dorsiflexion    Ankle plantarflexion    Ankle inversion    Ankle eversion     (Blank rows = not tested)  LOWER EXTREMITY MMT:  Tested in seated position   MMT Right Eval Left Eval  Hip flexion 5 2+  Hip extension    Hip abduction 5 3-  Hip adduction 5 2+  Hip internal rotation    Hip external rotation    Knee flexion 5 3-  Knee extension 5 3-  Ankle dorsiflexion 5 2+  Ankle plantarflexion    Ankle inversion    Ankle eversion    (Blank rows = not tested)  BED MOBILITY:  Independent per pt   TRANSFERS: Sit to stand: SBA  Assistive device utilized: Environmental consultant - 2 wheeled     Stand to sit: SBA  Assistive device utilized: Environmental consultant - 2 wheeled     Very slow initiation to stand, poor eccentric control w/reliance on BUE support to descend, weight shift to R   RAMP:  Not tested  CURB:  Not tested  STAIRS: Not tested GAIT: Gait pattern: step to pattern, decreased step length- Left, decreased stride length, decreased hip/knee flexion- Left, decreased ankle dorsiflexion- Left, lateral hip instability, lateral lean- Right, and poor foot clearance- Left Distance walked: various clinic distances  Assistive device utilized: Walker - 2 wheeled Level of assistance: SBA Comments: Pt maintains LLE in extension w/heavy lean to R side    VITALS  Vitals:   01/09/24 1539  BP: 113/80  Pulse: 95  TREATMENT:    Self-care/home management  Assessed vitals in RUE (see above) and WNL.  NMR  Gait no AD  Gait w/4# ankle weight on LLE  Gait no AD and no weight w/emphasis of hip flexion   Eccentric heel taps  Gait w/heel strike  Alt fwd step w/6# slam ball    [facility] PT Assessment - 01/09/24 1541       Balance   Balance Assessed Yes      Standardized Balance Assessment   Standardized Balance Assessment 10 meter walk test    10 Meter Walk 0.27 m/s   37.6s no AD     Functional Gait  Assessment   Gait assessed  Yes               PATIENT EDUCATION: Education details: POC, eval findings, see self-care section  Person educated: Patient and Spouse Education method: Explanation and Demonstration Education comprehension: verbalized understanding and needs further education  HOME EXERCISE PROGRAM: To be established   GOALS: Goals reviewed with patient? Yes  SHORT TERM GOALS: Target date: 01/26/2024   Pt will be independent with initial HEP for improved strength, balance, transfers and gait.  Baseline: not established on eval   Goal status: INITIAL  2.  Pt will improve 5 x STS to less than or equal to 60 seconds w/no UE support to demonstrate improved functional strength and transfer efficiency.   Baseline: 92.28s Goal status: INITIAL  3.  to be assessed and STG/LTG updated  Baseline:  Goal status: INITIAL  4.  FGA to be assessed and LTG updated  Baseline:  Goal status: INITIAL    LONG TERM GOALS: Target date: 02/09/2024   Pt will be independent with final gym-based HEP for improved strength, balance, transfers and gait.  Baseline:  Goal status: INITIAL  2.  Pt will improve 5 x STS to less than or equal to 30 seconds w/no UE support to demonstrate improved functional strength and transfer efficiency.   Baseline: 92.28s w/BUE support  Goal status: INITIAL  3.  goal  Baseline:  Goal status: INITIAL  4.  FGA goal  Baseline:  Goal status: INITIAL   ASSESSMENT:  CLINICAL IMPRESSION: Patient is a 43 year old female referred to Neuro OPPT for hypertension and stroke-like symptoms  Pt's PMH is significant for: SVT, HTN, PE,  asthma, hypothyroidism and PFO. The following deficits were present during the exam: impaired sensation, decreased functional strength, impaired balance and impaired gait kinematics. Based on 5x STS, pt has severe functional limitations that increase her risk of falls. Pt would benefit from skilled PT to address these impairments and functional limitations to maximize functional mobility independence.    OBJECTIVE IMPAIRMENTS: Abnormal gait, cardiopulmonary status limiting activity, decreased activity tolerance, decreased coordination, decreased knowledge of use of DME, decreased mobility, difficulty walking, decreased strength, impaired sensation, impaired tone, impaired UE functional use, improper body mechanics, and pain.   ACTIVITY LIMITATIONS: carrying, lifting, squatting, stairs, bathing, locomotion level, and caring for others  PARTICIPATION LIMITATIONS: meal prep, cleaning, laundry, interpersonal relationship, driving, shopping, community activity, occupation, and yard work  PERSONAL FACTORS: Fitness, Past/current experiences, and 1-2 comorbidities: Chiari I malformation and  are also affecting patient's functional outcome.   REHAB POTENTIAL: Good  CLINICAL DECISION MAKING: Evolving/moderate complexity  EVALUATION COMPLEXITY: Moderate  PLAN:  PT FREQUENCY: 1-2x/week (due to copay)  PT DURATION: 6 weeks  PLANNED INTERVENTIONS: 97164- PT Re-evaluation, 97750- Physical Performance Testing, 97110-Therapeutic exercises, 97530- Therapeutic activity, [id number]- Neuromuscular re-education, 97535-  Self Care, 53664- Manual therapy, [phone]- Gait training, [phone]- Orthotic Initial, [phone]- Orthotic/Prosthetic subsequent, [phone]- Electrical stimulation (manual), Patient/Family education, Balance training, Stair training, Dry Needling, Joint mobilization, Spinal mobilization, Vestibular training, and DME instructions  PLAN FOR NEXT SESSION: Monitor vitals, Bioness to L anterior tib and hamstring (quads  are also weak); FGA and and update goal.    Bayani Renteria E Lebaron Bautch, PT, DPT 01/09/2024, 4:23 PM

## 2024-01-10 ENCOUNTER — Telehealth: Payer: Self-pay

## 2024-01-10 NOTE — Transitions of Care (Post Inpatient/ED Visit) (Signed)
   01/10/2024  Name: Kelsey Friedman MRN: [id number] DOB: February 25, 1981  Today's TOC FU Call Status: EMMI Stroke Follow up Attempt #2    Attempted to reach the patient regarding the most recent Inpatient/ED visit.  Follow Up Plan: Additional outreach attempts will be made to reach the patient to complete the Transitions of Care (Post Inpatient/ED visit) call.   Majestic Molony J. Avishai Reihl RN, MSN [facility], [facility] RN Care Manager Direct Dial: [phone]  Fax: [phone] Website: [email or web address]

## 2024-01-11 ENCOUNTER — Ambulatory Visit: Admitting: Physical Therapy

## 2024-01-11 ENCOUNTER — Telehealth: Payer: Self-pay

## 2024-01-11 VITALS — BP 101/68 | HR 83

## 2024-01-11 DIAGNOSIS — R2681 Unsteadiness on feet: Secondary | ICD-10-CM

## 2024-01-11 DIAGNOSIS — M6281 Muscle weakness (generalized): Secondary | ICD-10-CM

## 2024-01-11 DIAGNOSIS — R2689 Other abnormalities of gait and mobility: Secondary | ICD-10-CM

## 2024-01-11 NOTE — Transitions of Care (Post Inpatient/ED Visit) (Signed)
   01/11/2024  Name: Kelsey Friedman MRN: [id number] DOB: 1981-06-18  Today's TOC FU Call Status: EMMI Stroke Attempt #3    Attempted to reach the patient regarding the most recent Inpatient/ED visit.  Follow Up Plan: No further outreach attempts will be made at this time. We have been unable to contact the patient.  [name] J. Esthela Brandner RN, MSN [facility], [facility] RN Care Manager Direct Dial: [phone]  Fax: [phone] Website: [email or web address]

## 2024-01-11 NOTE — Therapy (Signed)
 OUTPATIENT PHYSICAL THERAPY NEURO TREATMENT   Patient Name: Kelsey Friedman MRN: [id number] DOB:[birth year], 43 y.o., female Today's Date: 01/11/2024   PCP: Merl Star, MD REFERRING PROVIDER: Audrene Lease, NP  END OF SESSION:  PT End of Session - 01/11/24 0802     Visit Number 3    Number of Visits 13    Date for PT Re-Evaluation 02/16/24    Authorization Type [employer]et[employer]    PT Start Time 0801    PT Stop Time 0843    PT Time Calculation (min) 42 min    Equipment Utilized During Treatment Gait belt    Activity Tolerance Patient tolerated treatment well    Behavior During Therapy WFL for tasks assessed/performed               Past Medical History:  Diagnosis Date   Asthma    Cerebral aneurysm    Chiari I malformation (HCC)    HLD (hyperlipidemia)    HTN (hypertension)    Hypothyroidism    PE (pulmonary embolism)    SOB (shortness of breath)    Past Surgical History:  Procedure Laterality Date   CHOLECYSTECTOMY     TONSILLECTOMY     Patient Active Problem List   Diagnosis Date Noted   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   History of panic attacks 12/24/2023   PFO (patent foramen ovale) 12/24/2023   Essential hypertension 12/24/2023   Hyperlipidemia 12/24/2023   Stroke determined by clinical assessment (HCC) 12/22/2023   Mild persistent asthma without complication 06/16/2017   Seasonal and perennial allergic rhinitis 04/29/2014   Pulmonary embolism (HCC) 10/28/2010   Extrinsic asthma 05/05/2010    ONSET DATE: 12/24/2023  REFERRING DIAG: I10 (ICD-10-CM) - Essential hypertension  THERAPY DIAG:  Unsteadiness on feet  Other abnormalities of gait and mobility  Muscle weakness (generalized)  Rationale for Evaluation and Treatment: Rehabilitation  SUBJECTIVE:                                                                                                                                                                                              SUBJECTIVE STATEMENT: Pt presents w/RW. Reports doing well, no changes since last visit. Took her medications but did not eat this AM.   Pt accompanied by: Husband, [name]   PERTINENT HISTORY: SVT, HTN, PE, asthma, hypothyroidism, PFO, stroke-like symptoms on 12/22/23   PAIN:  Are you having pain? No and pt reports occasional sinus pain and neck pain   PRECAUTIONS: Fall  RED FLAGS: None   WEIGHT BEARING RESTRICTIONS: No  FALLS: Has patient fallen in last 6 months? 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   PATIENT GOALS: " To strengthen my L side back up and no walker and to go back to normal"   OBJECTIVE:  Note: Objective measures were completed at Evaluation unless otherwise noted.  DIAGNOSTIC FINDINGS: MRI of brain from 12/23/23  IMPRESSION: 1. No acute intracranial abnormality. 2. Single subcortical T2 hyperintensity in the left parietal lobe. The finding is nonspecific but can be seen in the setting of chronic microvascular ischemia, a demyelinating process such as multiple sclerosis, vasculitis, complicated migraine headaches, or as the sequelae of a prior infectious or inflammatory process. 3. 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 The tonsils are somewhat pointed and mild crowding of the foramen magnum is present.   COGNITION: Overall cognitive status: Difficulty word finding - delayed speech    SENSATION: Pt reports numbness in L face and L leg   COORDINATION: Heel to shin: WNL on RLE, unable to obtain full ROM on LLE but no ataxia noted  Finger to nose test: WNL on R, slowed but accurate on L   MUSCLE TONE: RLE: Pt reports  frequent "shaking" in R hemibody w/fatigue    POSTURE: No Significant postural limitations  LOWER EXTREMITY ROM:     Active  Right Eval Left Eval  Hip flexion    Hip extension    Hip abduction    Hip adduction    Hip internal rotation    Hip external rotation    Knee flexion    Knee extension    Ankle dorsiflexion    Ankle plantarflexion    Ankle inversion    Ankle eversion     (Blank rows = not tested)  LOWER EXTREMITY MMT:  Tested in seated position   MMT Right Eval Left Eval  Hip flexion 5 2+  Hip extension    Hip abduction 5 3-  Hip adduction 5 2+  Hip internal rotation    Hip external rotation    Knee flexion 5 3-  Knee extension 5 3-  Ankle dorsiflexion 5 2+  Ankle plantarflexion    Ankle inversion    Ankle eversion    (Blank rows = not tested)  BED MOBILITY:  Independent per pt   TRANSFERS: Sit to stand: SBA  Assistive device utilized: Environmental consultant - 2 wheeled     Stand to sit: SBA  Assistive device utilized: Environmental consultant - 2 wheeled     Very slow initiation to stand, poor eccentric control w/reliance on BUE support to descend, weight shift to R   RAMP:  Not tested  CURB:  Not tested  STAIRS: Not tested GAIT: Gait pattern: step to pattern, decreased step length- Left, decreased stride length, decreased hip/knee flexion- Left, decreased ankle dorsiflexion- Left, lateral hip instability, lateral lean- Right, and poor foot clearance- Left Distance walked: various clinic distances  Assistive device utilized: Walker - 2 wheeled Level of assistance: SBA Comments: Pt maintains LLE in extension w/heavy lean to R side    VITALS  Vitals:   01/11/24 0805  BP: 101/68  Pulse: 83  TREATMENT:    Self-care/home management  Assessed vitals in LUE (see above) and WNL.  NMR   The following treadmill training was completed for  aerobic/neural priming, endurance, and step clearance.  - Warmup: 3:00 up to 1.2 mph - HIIT: 5:00 30sec ON/OFF alternating large blue theraball kicks and normal gait at 1.2 mph - Cool down: 1:00 at 1.6 mph   The following treadmill training was completed for aerobic/neural priming, endurance, and gait speed.  - Warmup: 2:00 up to 1.6 mph - HIIT: 5:00 30sec ON/OFF alternating 1.6 / 2.6 mph   - Cool down: 1:00 at 2.0 mph  -RPE of 7/10   Supine glute bridges w/feet elevated on green ball, x10 reps, for improved core stability and posterior chain strength.  Supine glute bridges w/hamstring curl w/feet elevated on green ball, x10 reps. Pt performed well w/no instability. RPE of 8.5/10 following activity.     PATIENT EDUCATION: Education details: Working on heel strike w/IC, increased cadence and step-through pattern at home. Encouraged pt to no longer use RW  Person educated: Patient and Spouse Education method: Explanation, Demonstration, and Verbal cues Education comprehension: verbalized understanding, returned demonstration, verbal cues required, and needs further education  HOME EXERCISE PROGRAM: To be established   GOALS: Goals reviewed with patient? Yes  SHORT TERM GOALS: Target date: 01/26/2024   Pt will be independent with initial HEP for improved strength, balance, transfers and gait.  Baseline: not established on eval   Goal status: INITIAL  2.  Pt will improve 5 x STS to less than or equal to 60 seconds w/no UE support to demonstrate improved functional strength and transfer efficiency.   Baseline: 92.28s Goal status: INITIAL  3.  Pt will improve gait velocity to at least 0.5 m/s no AD for improved gait efficiency and independence  Baseline: 0.27 m/s no AD (5/12)  Goal status: REVISED   4.  FGA to be assessed and LTG updated  Baseline:  Goal status: INITIAL    LONG TERM GOALS: Target date: 02/09/2024   Pt will be independent with final gym-based HEP for  improved strength, balance, transfers and gait.  Baseline:  Goal status: INITIAL  2.  Pt will improve 5 x STS to less than or equal to 30 seconds w/no UE support to demonstrate improved functional strength and transfer efficiency.   Baseline: 92.28s w/BUE support  Goal status: INITIAL  3.  Pt will improve gait velocity to at least 0.8 m/s for improved gait efficiency and reduced fall risk   Baseline: 0.27 m/s no AD (5/12)  Goal status: REVISED   4.  FGA goal  Baseline:  Goal status: INITIAL   ASSESSMENT:  CLINICAL IMPRESSION: Emphasis of skilled PT session on gait training on TM for improved speed and step clearance and posterior chain strengthening. Pt initially demonstrating exaggerated step length on TM (almost a lunge), requiring max cues for reduced step length while maintaining step clearance. Pt w/improved gait mechanics following ball kicks and tolerated speed intervals well. Continue POC.    OBJECTIVE IMPAIRMENTS: Abnormal gait, cardiopulmonary status limiting activity, decreased activity tolerance, decreased coordination, decreased knowledge of use of DME, decreased mobility, difficulty walking, decreased strength, impaired sensation, impaired tone, impaired UE functional use, improper body mechanics, and pain.   ACTIVITY LIMITATIONS: carrying, lifting, squatting, stairs, bathing, locomotion level, and caring for others  PARTICIPATION LIMITATIONS: meal prep, cleaning, laundry, interpersonal relationship, driving, shopping, community activity, occupation, and yard work  PERSONAL FACTORS: Fitness, Past/current experiences, and 1-2 comorbidities: Chiari  I malformation and  are also affecting patient's functional outcome.   REHAB POTENTIAL: Good  CLINICAL DECISION MAKING: Evolving/moderate complexity  EVALUATION COMPLEXITY: Moderate  PLAN:  PT FREQUENCY: 1-2x/week (due to copay)  PT DURATION: 6 weeks  PLANNED INTERVENTIONS: 40981- PT Re-evaluation, 97750- Physical  Performance Testing, 97110-Therapeutic exercises, 97530- Therapeutic activity, [id number]- Neuromuscular re-education, 97535- Self Care, 19147- Manual therapy, [phone]- Gait training, [phone]- Orthotic Initial, [phone]- Orthotic/Prosthetic subsequent, [phone]- Electrical stimulation (manual), Patient/Family education, Balance training, Stair training, Dry Needling, Joint mobilization, Spinal mobilization, Vestibular training, and DME instructions  PLAN FOR NEXT SESSION: Monitor vitals, FGA and update goal. TM training, lifting, squats, functional fitness    Josue Falconi E Yandiel Bergum, PT, DPT 01/11/2024, 8:47 AM

## 2024-01-16 ENCOUNTER — Ambulatory Visit: Admitting: Physical Therapy

## 2024-01-16 DIAGNOSIS — R2689 Other abnormalities of gait and mobility: Secondary | ICD-10-CM

## 2024-01-16 DIAGNOSIS — M6281 Muscle weakness (generalized): Secondary | ICD-10-CM

## 2024-01-16 NOTE — Therapy (Signed)
 OUTPATIENT PHYSICAL THERAPY NEURO TREATMENT   Patient Name: Kelsey Friedman MRN: [id number] DOB:[birth year], 43 y.o., female Today's Date: 01/16/2024   PCP: Merl Star, MD REFERRING PROVIDER: Audrene Lease, NP  END OF SESSION:  PT End of Session - 01/16/24 1323     Visit Number 4    Number of Visits 13    Date for PT Re-Evaluation 02/16/24    Authorization Type [employer]    PT Start Time 1321   Pt arrived late   PT Stop Time 1411    PT Time Calculation (min) 50 min    Equipment Utilized During Treatment Gait belt    Activity Tolerance Patient tolerated treatment well    Behavior During Therapy WFL for tasks assessed/performed               Past Medical History:  Diagnosis Date   Asthma    Cerebral aneurysm    Chiari I malformation (HCC)    HLD (hyperlipidemia)    HTN (hypertension)    Hypothyroidism    PE (pulmonary embolism)    SOB (shortness of breath)    Past Surgical History:  Procedure Laterality Date   CHOLECYSTECTOMY     TONSILLECTOMY     Patient Active Problem List   Diagnosis Date Noted   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   History of panic attacks 12/24/2023   PFO (patent foramen ovale) 12/24/2023   Essential hypertension 12/24/2023   Hyperlipidemia 12/24/2023   Stroke determined by clinical assessment (HCC) 12/22/2023   Mild persistent asthma without complication 06/16/2017   Seasonal and perennial allergic rhinitis 04/29/2014   Pulmonary embolism (HCC) 10/28/2010   Extrinsic asthma 05/05/2010    ONSET DATE: 12/24/2023  REFERRING DIAG: I10 (ICD-10-CM) - Essential hypertension  THERAPY DIAG:  Other abnormalities of gait and mobility  Muscle weakness (generalized)  Rationale for Evaluation and Treatment: Rehabilitation  SUBJECTIVE:                                                                                                                                                                                             SUBJECTIVE  STATEMENT: Pt presents w/no AD. Husband reports they went to the gym on Thursday and walked 0.107mi on TM and attempted the recumbent bike, but pt unable to activate hamstring well and struggled. No falls. Pt having some left foot pain in the AM   Pt accompanied by: Husband, [name]   PERTINENT HISTORY: SVT, HTN, PE, asthma, hypothyroidism, PFO, stroke-like symptoms on 12/22/23   PAIN:  Are you having pain? No and pt reports occasional sinus pain and neck pain   PRECAUTIONS:  Fall  RED FLAGS: None   WEIGHT BEARING RESTRICTIONS: No  FALLS: Has patient fallen in last 6 months? 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   PATIENT GOALS: " To strengthen my L side back up and no walker and to go back to normal"   OBJECTIVE:  Note: Objective measures were completed at Evaluation unless otherwise noted.  DIAGNOSTIC FINDINGS: MRI of brain from 12/23/23  IMPRESSION: 1. No acute intracranial abnormality. 2. Single subcortical T2 hyperintensity in the left parietal lobe. The finding is nonspecific but can be seen in the setting of chronic microvascular ischemia, a demyelinating process such as multiple sclerosis, vasculitis, complicated migraine headaches, or as the sequelae of a prior infectious or inflammatory process. 3. 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 The tonsils are somewhat pointed and mild crowding of the foramen magnum is present.   COGNITION: Overall cognitive status: Difficulty word finding - delayed speech    SENSATION: Pt reports numbness in L face and L leg   COORDINATION: Heel to shin: WNL on RLE, unable to obtain full ROM on LLE but  no ataxia noted  Finger to nose test: WNL on R, slowed but accurate on L   MUSCLE TONE: RLE: Pt reports frequent "shaking" in R hemibody w/fatigue    POSTURE: No Significant postural limitations  LOWER EXTREMITY ROM:     Active  Right Eval Left Eval  Hip flexion    Hip extension    Hip abduction    Hip adduction    Hip internal rotation    Hip external rotation    Knee flexion    Knee extension    Ankle dorsiflexion    Ankle plantarflexion    Ankle inversion    Ankle eversion     (Blank rows = not tested)  LOWER EXTREMITY MMT:  Tested in seated position   MMT Right Eval Left Eval  Hip flexion 5 2+  Hip extension    Hip abduction 5 3-  Hip adduction 5 2+  Hip internal rotation    Hip external rotation    Knee flexion 5 3-  Knee extension 5 3-  Ankle dorsiflexion 5 2+  Ankle plantarflexion    Ankle inversion    Ankle eversion    (Blank rows = not tested)  BED MOBILITY:  Independent per pt   TRANSFERS: Sit to stand: SBA  Assistive device utilized: Environmental consultant - 2 wheeled     Stand to sit: SBA  Assistive device utilized: Environmental consultant - 2 wheeled     Very slow initiation to stand, poor eccentric control w/reliance on BUE support to descend, weight shift to R   RAMP:  Not tested  CURB:  Not tested  STAIRS: Not tested GAIT: Gait pattern: step to pattern, decreased step length- Left, decreased stride length, decreased hip/knee flexion- Left, decreased ankle dorsiflexion- Left, lateral hip instability, lateral lean- Right, and poor foot clearance- Left Distance walked: various clinic distances  Assistive device utilized: Walker - 2 wheeled Level of assistance: SBA Comments: Pt maintains LLE in extension w/heavy lean to R side    VITALS  There were no vitals filed for this visit.  TREATMENT:    Self-care/home management  Pt emotional  at beginning of session and husband reports weekends are tough for her, but pt will not state why. Husband states pt has really high highs and really low lows and this weekend was a low. Encouraged pt to seek out counseling, as her mental health directly affects her physical health. Pt verbalized understanding.  Informed pt that progress w/CVA is not always linear and reminded her of how far she has already came. Pt in better spirits by end of conversation.  Educated pt and husband on exercises to work on at home/gym following session. Emphasis on elliptical and dead lift at this time   Ther Act  Elliptical level 1 for 9 minutes (5 fwd, 4 retro) for improved cardiovascular endurance, reciprocal coordination and functional BLE strength. Pt more challenged w/retro direction but performed well.  At staircase w/red resistance band around L ankle (held by [name]), toe taps to first and then second step, x10 reps, for improved functional hip strength and LE coordination. Pt challenged by activity but performed well.  B-stance RDLs w/20# KB, x10 reps per side, for improved single leg stability, facilitation of hip hinge and hamstring strength. Max multimodal cues provided to facilitate hip hinge and reduce strain on back, but pt tends to round forward rather than hinge hips. Regressed to performing without weight and having pt slide her hands down her thighs to imitate hinge, which pt performed well. Had pt perform toe sweeps to feel strain on HS and prime hip hinge, but pt w/poor carryover to RDL. Educated pt's husband on working on this at gym w/Smith machine or barbell and perform regular RDL or traditional deadlift rather than B-stance   PATIENT EDUCATION: Education details: Production assistant, radio, work on Hydrologist at gym  Person educated: Patient and Spouse Education method: Programmer, multimedia, Facilities manager, and Verbal cues Education comprehension: verbalized understanding, returned demonstration, verbal  cues required, and needs further education  HOME EXERCISE PROGRAM: To be established   GOALS: Goals reviewed with patient? Yes  SHORT TERM GOALS: Target date: 01/26/2024   Pt will be independent with initial HEP for improved strength, balance, transfers and gait.  Baseline: not established on eval   Goal status: INITIAL  2.  Pt will improve 5 x STS to less than or equal to 60 seconds w/no UE support to demonstrate improved functional strength and transfer efficiency.   Baseline: 92.28s Goal status: INITIAL  3.  Pt will improve gait velocity to at least 0.5 m/s no AD for improved gait efficiency and independence  Baseline: 0.27 m/s no AD (5/12)  Goal status: REVISED   4.  FGA to be assessed and LTG updated  Baseline:  Goal status: INITIAL    LONG TERM GOALS: Target date: 02/09/2024   Pt will be independent with final gym-based HEP for improved strength, balance, transfers and gait.  Baseline:  Goal status: INITIAL  2.  Pt will improve 5 x STS to less than or equal to 30 seconds w/no UE support to demonstrate improved functional strength and transfer efficiency.   Baseline: 92.28s w/BUE support  Goal status: INITIAL  3.  Pt will improve gait velocity to at least 0.8 m/s for improved gait efficiency and reduced fall risk   Baseline: 0.27 m/s no AD (5/12)  Goal status: REVISED   4.  FGA goal  Baseline:  Goal status: INITIAL   ASSESSMENT:  CLINICAL IMPRESSION: Emphasis of skilled PT session on functional BLE strength, reciprocal coordination and  facilitation of hip hinge. Pt emotional at beginning of session, requiring therapeutic listening and encouragement regarding progress to continue w/session. Pt tolerated session well and was most challenged by facilitation of hip hinge to strengthen hamstrings, so encouraged pt to work on this at gym. Continue POC.    OBJECTIVE IMPAIRMENTS: Abnormal gait, cardiopulmonary status limiting activity, decreased activity  tolerance, decreased coordination, decreased knowledge of use of DME, decreased mobility, difficulty walking, decreased strength, impaired sensation, impaired tone, impaired UE functional use, improper body mechanics, and pain.   ACTIVITY LIMITATIONS: carrying, lifting, squatting, stairs, bathing, locomotion level, and caring for others  PARTICIPATION LIMITATIONS: meal prep, cleaning, laundry, interpersonal relationship, driving, shopping, community activity, occupation, and yard work  PERSONAL FACTORS: Fitness, Past/current experiences, and 1-2 comorbidities: Chiari I malformation and  are also affecting patient's functional outcome.   REHAB POTENTIAL: Good  CLINICAL DECISION MAKING: Evolving/moderate complexity  EVALUATION COMPLEXITY: Moderate  PLAN:  PT FREQUENCY: 1-2x/week (due to copay)  PT DURATION: 6 weeks  PLANNED INTERVENTIONS: 40981- PT Re-evaluation, 97750- Physical Performance Testing, 97110-Therapeutic exercises, 97530- Therapeutic activity, [id number]- Neuromuscular re-education, 97535- Self Care, [id number]- Manual therapy, [id number]- Gait training, [phone]- Orthotic Initial, [phone]- Orthotic/Prosthetic subsequent, [phone]- Electrical stimulation (manual), Patient/Family education, Balance training, Stair training, Dry Needling, Joint mobilization, Spinal mobilization, Vestibular training, and DME instructions  PLAN FOR NEXT SESSION: Monitor vitals, FGA and update goal. TM training, lifting, squats, functional fitness, hip flexion in shortened position    Correy Weidner E Marquez Ceesay, PT, DPT 01/16/2024, 2:12 PM

## 2024-01-18 ENCOUNTER — Ambulatory Visit: Admitting: Physical Therapy

## 2024-01-18 VITALS — BP 93/68 | HR 93

## 2024-01-18 DIAGNOSIS — R279 Unspecified lack of coordination: Secondary | ICD-10-CM

## 2024-01-18 DIAGNOSIS — M6281 Muscle weakness (generalized): Secondary | ICD-10-CM

## 2024-01-18 DIAGNOSIS — R2689 Other abnormalities of gait and mobility: Secondary | ICD-10-CM

## 2024-01-18 NOTE — Therapy (Signed)
 OUTPATIENT PHYSICAL THERAPY NEURO TREATMENT   Patient Name: Kelsey Friedman MRN: [id number] DOB:18-Jun-1981, 43 y.o., female Today's Date: 01/18/2024   PCP: Merl Star, MD REFERRING PROVIDER: Audrene Lease, NP  END OF SESSION:  PT End of Session - 01/18/24 0804     Visit Number 5    Number of Visits 13    Date for PT Re-Evaluation 02/16/24    Authorization Type [employer]et[employer]    PT Start Time 0802    PT Stop Time 0845    PT Time Calculation (min) 43 min    Equipment Utilized During Treatment Gait belt    Activity Tolerance Patient tolerated treatment well    Behavior During Therapy WFL for tasks assessed/performed                Past Medical History:  Diagnosis Date   Asthma    Cerebral aneurysm    Chiari I malformation (HCC)    HLD (hyperlipidemia)    HTN (hypertension)    Hypothyroidism    PE (pulmonary embolism)    SOB (shortness of breath)    Past Surgical History:  Procedure Laterality Date   CHOLECYSTECTOMY     TONSILLECTOMY     Patient Active Problem List   Diagnosis Date Noted   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   History of panic attacks 12/24/2023   PFO (patent foramen ovale) 12/24/2023   Essential hypertension 12/24/2023   Hyperlipidemia 12/24/2023   Stroke determined by clinical assessment (HCC) 12/22/2023   Mild persistent asthma without complication 06/16/2017   Seasonal and perennial allergic rhinitis 04/29/2014   Pulmonary embolism (HCC) 10/28/2010   Extrinsic asthma 05/05/2010    ONSET DATE: 12/24/2023  REFERRING DIAG: I10 (ICD-10-CM) - Essential hypertension  THERAPY DIAG:  Other abnormalities of gait and mobility  Muscle weakness (generalized)  Unspecified lack of coordination  Rationale for Evaluation and Treatment: Rehabilitation  SUBJECTIVE:                                                                                                                                                                                              SUBJECTIVE STATEMENT: Pt presents w/no AD. Reports doing well, yesterday was a good day. Has been walking more independently. Hips are a bit sore from last session but no pain.   Pt accompanied by: Husband, [name]   PERTINENT HISTORY: SVT, HTN, PE, asthma, hypothyroidism, PFO, stroke-like symptoms on 12/22/23   PAIN:  Are you having pain? No and pt reports occasional sinus pain and neck pain   PRECAUTIONS: Fall  RED FLAGS: None   WEIGHT BEARING RESTRICTIONS: No  FALLS:  Has patient fallen in last 6 months? 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   PATIENT GOALS: " To strengthen my L side back up and no walker and to go back to normal"   OBJECTIVE:  Note: Objective measures were completed at Evaluation unless otherwise noted.  DIAGNOSTIC FINDINGS: MRI of brain from 12/23/23  IMPRESSION: 1. No acute intracranial abnormality. 2. Single subcortical T2 hyperintensity in the left parietal lobe. The finding is nonspecific but can be seen in the setting of chronic microvascular ischemia, a demyelinating process such as multiple sclerosis, vasculitis, complicated migraine headaches, or as the sequelae of a prior infectious or inflammatory process. 3. 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 The tonsils are somewhat pointed and mild crowding of the foramen magnum is present.   COGNITION: Overall cognitive status: Difficulty word finding - delayed speech    SENSATION: Pt reports numbness in L face and L leg   COORDINATION: Heel to shin: WNL on RLE, unable to obtain full ROM on LLE but no ataxia noted  Finger to nose test: WNL on R, slowed  but accurate on L   MUSCLE TONE: RLE: Pt reports frequent "shaking" in R hemibody w/fatigue    POSTURE: No Significant postural limitations  LOWER EXTREMITY ROM:     Active  Right Eval Left Eval  Hip flexion    Hip extension    Hip abduction    Hip adduction    Hip internal rotation    Hip external rotation    Knee flexion    Knee extension    Ankle dorsiflexion    Ankle plantarflexion    Ankle inversion    Ankle eversion     (Blank rows = not tested)  LOWER EXTREMITY MMT:  Tested in seated position   MMT Right Eval Left Eval  Hip flexion 5 2+  Hip extension    Hip abduction 5 3-  Hip adduction 5 2+  Hip internal rotation    Hip external rotation    Knee flexion 5 3-  Knee extension 5 3-  Ankle dorsiflexion 5 2+  Ankle plantarflexion    Ankle inversion    Ankle eversion    (Blank rows = not tested)  BED MOBILITY:  Independent per pt   TRANSFERS: Sit to stand: SBA  Assistive device utilized: Environmental consultant - 2 wheeled     Stand to sit: SBA  Assistive device utilized: Environmental consultant - 2 wheeled     Very slow initiation to stand, poor eccentric control w/reliance on BUE support to descend, weight shift to R   RAMP:  Not tested  CURB:  Not tested  STAIRS: Not tested GAIT: Gait pattern: step to pattern, decreased step length- Left, decreased stride length, decreased hip/knee flexion- Left, decreased ankle dorsiflexion- Left, lateral hip instability, lateral lean- Right, and poor foot clearance- Left Distance walked: various clinic distances  Assistive device utilized: Walker - 2 wheeled Level of assistance: SBA Comments: Pt maintains LLE in extension w/heavy lean to R side    VITALS  Vitals:   01/18/24 0843  BP: 93/68  Pulse: 93  TREATMENT:    Ther Act  Elliptical level 1.5 for 8 minutes (4 fwd, 4 retro) for improved  cardiovascular endurance, reciprocal coordination and functional BLE strength. Pt w/improved posture and speed today compared to previous session. RPE of 7/10 following activity.  Farmer's carries x25' carrying 25# KB in single arm, x2 reps each UE. Pt initially demonstrating shuffling gait, so min cues to facilitate step-through pattern w/BLEs and pt able to demonstrate well on second set.  Per pt request, at staircase w/red resistance band around ankle (held by [name]), toe taps to first and then second step, x12 reps per side, for improved functional hip strength, LE coordination and single leg stability. Pt w/improved performance this date.  Quadruped banded bird dogs w/red resistance band, x8 reps per side w/CGA for lateral stability. Min cues for core activation throughout and to reduce lateral lean compensation.  Standing goodmornings w/black resistance band, x18 reps w/focus on slow eccentric and fast concentric. Pt used mirror for visual biofeedback on body position and facilitated proper hip hinge today.  Kang squats w/2# dowel, x10 reps, using mirror for visual biofeedback on body position. Pt performed very slowly and cautiously, but noted equal weight shift and proper body mechanics throughout.  RPE of 8.5/10 following activity.    Self-care Assessed vitals at end of session per pt request (see above) and BP on lower end but WNL. Pt reports her BP has been running in the 90/60 range and denies lightheadedness or dizziness   PATIENT EDUCATION: Education details: Focus more on walking next week following her surgery  Person educated: Patient and Spouse Education method: Explanation Education comprehension: verbalized understanding  HOME EXERCISE PROGRAM: To be established   GOALS: Goals reviewed with patient? Yes  SHORT TERM GOALS: Target date: 01/26/2024   Pt will be independent with initial HEP for improved strength, balance, transfers and gait.  Baseline: not established on  eval   Goal status: INITIAL  2.  Pt will improve 5 x STS to less than or equal to 60 seconds w/no UE support to demonstrate improved functional strength and transfer efficiency.   Baseline: 92.28s Goal status: INITIAL  3.  Pt will improve gait velocity to at least 0.5 m/s no AD for improved gait efficiency and independence  Baseline: 0.27 m/s no AD (5/12)  Goal status: REVISED   4.  FGA to be assessed and LTG updated  Baseline:  Goal status: INITIAL    LONG TERM GOALS: Target date: 02/09/2024   Pt will be independent with final gym-based HEP for improved strength, balance, transfers and gait.  Baseline:  Goal status: INITIAL  2.  Pt will improve 5 x STS to less than or equal to 30 seconds w/no UE support to demonstrate improved functional strength and transfer efficiency.   Baseline: 92.28s w/BUE support  Goal status: INITIAL  3.  Pt will improve gait velocity to at least 0.8 m/s for improved gait efficiency and reduced fall risk   Baseline: 0.27 m/s no AD (5/12)  Goal status: REVISED   4.  FGA goal  Baseline:  Goal status: INITIAL   ASSESSMENT:  CLINICAL IMPRESSION: Emphasis of skilled PT session on functional BLE strength, single leg stability and step clearance. Pt tolerated session well and demonstrated increased gait speed and endurance compared to previous session. Pt able to facilitate proper hip hinge this date w/equal weight shift on BLEs and no instability. Pt encouraged to focus on rest next week following her surgery and work on walking if  able. Continue POC.    OBJECTIVE IMPAIRMENTS: Abnormal gait, cardiopulmonary status limiting activity, decreased activity tolerance, decreased coordination, decreased knowledge of use of DME, decreased mobility, difficulty walking, decreased strength, impaired sensation, impaired tone, impaired UE functional use, improper body mechanics, and pain.   ACTIVITY LIMITATIONS: carrying, lifting, squatting, stairs, bathing,  locomotion level, and caring for others  PARTICIPATION LIMITATIONS: meal prep, cleaning, laundry, interpersonal relationship, driving, shopping, community activity, occupation, and yard work  PERSONAL FACTORS: Fitness, Past/current experiences, and 1-2 comorbidities: Chiari I malformation and  are also affecting patient's functional outcome.   REHAB POTENTIAL: Good  CLINICAL DECISION MAKING: Evolving/moderate complexity  EVALUATION COMPLEXITY: Moderate  PLAN:  PT FREQUENCY: 1-2x/week (due to copay)  PT DURATION: 6 weeks  PLANNED INTERVENTIONS: 16109- PT Re-evaluation, 97750- Physical Performance Testing, 97110-Therapeutic exercises, 97530- Therapeutic activity, [id number]- Neuromuscular re-education, 97535- Self Care, 60454- Manual therapy, [phone]- Gait training, [phone]- Orthotic Initial, [phone]- Orthotic/Prosthetic subsequent, [phone]- Electrical stimulation (manual), Patient/Family education, Balance training, Stair training, Dry Needling, Joint mobilization, Spinal mobilization, Vestibular training, and DME instructions  PLAN FOR NEXT SESSION: Monitor vitals, FGA and update goal. TM training, lifting, squats, functional fitness, hip flexion in shortened position    Kohan Azizi E Daren Yeagle, PT, DPT 01/18/2024, 8:48 AM

## 2024-01-19 ENCOUNTER — Telehealth: Payer: Self-pay | Admitting: *Deleted

## 2024-01-19 NOTE — Telephone Encounter (Signed)
 PFO Closure scheduled at [facility] for: Tuesday Jan 24, 2024 9 AM Arrival time [facility] Main Entrance A at: 7 AM  Nothing to eat after midnight prior to procedure, clear liquids until 5 AM day of procedure.  Medication instructions: -Hold:  Losartan/hydrochlorothiazide-AM of procedure  -Other usual morning medications can be taken with sips of water including aspirin  81 mg and Plavix  75 mg  Plan to go home the same day, you will only stay overnight if medically necessary.  You must have responsible adult to drive you home.  Someone must be with you the first 24 hours after you arrive home.  Reviewed procedure instructions with patient.

## 2024-01-24 ENCOUNTER — Ambulatory Visit (HOSPITAL_COMMUNITY)
Admission: RE | Admit: 2024-01-24 | Discharge: 2024-01-24 | Disposition: A | Discharge status: Home / Self Care | Attending: Cardiology | Admitting: Cardiology

## 2024-01-24 ENCOUNTER — Ambulatory Visit: Admitting: Physical Therapy

## 2024-01-24 ENCOUNTER — Encounter (HOSPITAL_COMMUNITY)
Admission: RE | Disposition: A | Discharge status: Home / Self Care | Payer: Self-pay | Source: Home / Self Care | Attending: Cardiology

## 2024-01-24 ENCOUNTER — Ambulatory Visit (HOSPITAL_COMMUNITY)

## 2024-01-24 DIAGNOSIS — Q2112 Patent foramen ovale: Secondary | ICD-10-CM | POA: Diagnosis present

## 2024-01-24 DIAGNOSIS — Z79899 Other long term (current) drug therapy: Secondary | ICD-10-CM | POA: Insufficient documentation

## 2024-01-24 DIAGNOSIS — Z7902 Long term (current) use of antithrombotics/antiplatelets: Secondary | ICD-10-CM | POA: Insufficient documentation

## 2024-01-24 DIAGNOSIS — J4520 Mild intermittent asthma, uncomplicated: Secondary | ICD-10-CM | POA: Insufficient documentation

## 2024-01-24 DIAGNOSIS — Q211 Atrial septal defect, unspecified: Secondary | ICD-10-CM | POA: Diagnosis not present

## 2024-01-24 DIAGNOSIS — I10 Essential (primary) hypertension: Secondary | ICD-10-CM | POA: Insufficient documentation

## 2024-01-24 DIAGNOSIS — Z8249 Family history of ischemic heart disease and other diseases of the circulatory system: Secondary | ICD-10-CM | POA: Diagnosis not present

## 2024-01-24 DIAGNOSIS — I69354 Hemiplegia and hemiparesis following cerebral infarction affecting left non-dominant side: Secondary | ICD-10-CM | POA: Insufficient documentation

## 2024-01-24 HISTORY — PX: PATENT FORAMEN OVALE(PFO) CLOSURE: CATH118300

## 2024-01-24 LAB — POCT ACTIVATED CLOTTING TIME
Activated Clotting Time: 199 s
Activated Clotting Time: 222 s

## 2024-01-24 LAB — PREGNANCY, URINE: Preg Test, Ur: NEGATIVE

## 2024-01-24 SURGERY — PATENT FORAMEN OVALE (PFO) CLOSURE
Anesthesia: LOCAL

## 2024-01-24 MED ORDER — FENTANYL CITRATE (PF) 100 MCG/2ML IJ SOLN
INTRAMUSCULAR | Status: DC | PRN
  Administered 2024-01-24 (×2): 25 ug via INTRAVENOUS

## 2024-01-24 MED ORDER — CEFAZOLIN SODIUM-DEXTROSE 2-4 GM/100ML-% IV SOLN
INTRAVENOUS | Status: AC
  Administered 2024-01-24: 2 g
  Filled 2024-01-24: qty 100

## 2024-01-24 MED ORDER — MIDAZOLAM HCL 2 MG/2ML IJ SOLN
INTRAMUSCULAR | Status: AC
  Filled 2024-01-24: qty 2

## 2024-01-24 MED ORDER — MIDAZOLAM HCL 2 MG/2ML IJ SOLN
INTRAMUSCULAR | Status: DC | PRN
  Administered 2024-01-24: 2 mg via INTRAVENOUS

## 2024-01-24 MED ORDER — LIDOCAINE HCL (PF) 1 % IJ SOLN
INTRAMUSCULAR | Status: AC
  Filled 2024-01-24: qty 30

## 2024-01-24 MED ORDER — FENTANYL CITRATE (PF) 100 MCG/2ML IJ SOLN
INTRAMUSCULAR | Status: AC
  Filled 2024-01-24: qty 2

## 2024-01-24 MED ORDER — SODIUM CHLORIDE 0.9 % WEIGHT BASED INFUSION: 3.0000 mL/kg/h | INTRAVENOUS | Status: AC

## 2024-01-24 MED ORDER — CEFAZOLIN SODIUM-DEXTROSE 2-4 GM/100ML-% IV SOLN: 2.0000 g | INTRAVENOUS | Status: DC

## 2024-01-24 MED ORDER — HEPARIN SODIUM (PORCINE) 1000 UNIT/ML IJ SOLN
INTRAMUSCULAR | Status: DC | PRN
  Administered 2024-01-24: 8000 [IU] via INTRAVENOUS
  Administered 2024-01-24 (×2): 3000 [IU] via INTRAVENOUS

## 2024-01-24 MED ORDER — LIDOCAINE HCL (PF) 1 % IJ SOLN
INTRAMUSCULAR | Status: DC | PRN
  Administered 2024-01-24: 20 mL

## 2024-01-24 MED ORDER — HEPARIN (PORCINE) IN NACL 1000-0.9 UT/500ML-% IV SOLN
INTRAVENOUS | Status: DC | PRN
Start: 2024-01-24 — End: 2024-01-24
  Administered 2024-01-24: 500 mL

## 2024-01-24 MED ORDER — SODIUM CHLORIDE 0.9 % IV SOLN: 250.0000 mL | INTRAVENOUS | Status: DC | PRN

## 2024-01-24 MED ORDER — SODIUM CHLORIDE 0.9 % WEIGHT BASED INFUSION: 1.0000 mL/kg/h | INTRAVENOUS | Status: DC

## 2024-01-24 MED ORDER — CLOPIDOGREL BISULFATE 75 MG PO TABS: 75.0000 mg | ORAL_TABLET | ORAL | Status: DC

## 2024-01-24 MED ORDER — SODIUM CHLORIDE 0.9% FLUSH: 3.0000 mL | INTRAVENOUS | Status: DC | PRN

## 2024-01-24 MED ORDER — ONDANSETRON HCL 4 MG/2ML IJ SOLN: 4.0000 mg | Freq: Four times a day (QID) | INTRAMUSCULAR | Status: DC | PRN

## 2024-01-24 MED ORDER — ASPIRIN 81 MG PO CHEW: 81.0000 mg | CHEWABLE_TABLET | ORAL | Status: DC

## 2024-01-24 MED ORDER — ACETAMINOPHEN 325 MG PO TABS
650.0000 mg | ORAL_TABLET | ORAL | Status: DC | PRN
  Administered 2024-01-24: 650 mg via ORAL
  Filled 2024-01-24: qty 2

## 2024-01-24 SURGICAL SUPPLY — 19 items
CATH 8FR ACUNAV REPROCESSED (CATHETERS) IMPLANT
CATH REPROCESSED 8FR ACUNAV (CATHETERS) ×1 IMPLANT
CATH SUPER TORQUE PLUS 6F MPA1 (CATHETERS) IMPLANT
CLOSURE PERCLOSE PROSTYLE (VASCULAR PRODUCTS) IMPLANT
GUIDEWIRE ANGLED .035 180CM (WIRE) IMPLANT
KIT HEART LEFT (KITS) ×1 IMPLANT
KIT MICROPUNCTURE NIT STIFF (SHEATH) IMPLANT
OCCLUDER CARDIOFORM SEPTAL 30 (Prosthesis & Implant Heart) IMPLANT
PACK CARDIAC CATHETERIZATION (CUSTOM PROCEDURE TRAY) ×1 IMPLANT
SHEATH FAST CATH 12F 12CM (SHEATH) IMPLANT
SHEATH INTROD W/O MIN 9FR 25CM (SHEATH) IMPLANT
SHEATH PINNACLE 8F 10CM (SHEATH) IMPLANT
SHEATH PROBE COVER 6X72 (BAG) IMPLANT
TRANSDUCER W/STOPCOCK (MISCELLANEOUS) ×1 IMPLANT
TUBING CIL FLEX 10 FLL-RA (TUBING) ×1 IMPLANT
WIRE EMERALD 3MM-J .035X150CM (WIRE) IMPLANT
WIRE EMERALD ST .035X150CM (WIRE) IMPLANT
WIRE MICROINTRODUCER 60CM (WIRE) IMPLANT
WIRE ROSEN-J .035X180CM (WIRE) IMPLANT

## 2024-01-24 NOTE — Interval H&P Note (Signed)
 History and Physical Interval Note:  01/24/2024 8:47 AM  Kelsey Friedman  has presented today for surgery, with the diagnosis of PFO.  The various methods of treatment have been discussed with the patient and family. After consideration of risks, benefits and other options for treatment, the patient has consented to  Procedure(s): PATENT FORAMEN OVALE(PFO) CLOSURE (N/A) as a surgical intervention.  The patient's history has been reviewed, patient examined, no change in status, stable for surgery.  I have reviewed the patient's chart and labs.  Questions were answered to the patient's satisfaction.     Kelsey Friedman

## 2024-01-25 ENCOUNTER — Encounter (HOSPITAL_COMMUNITY): Payer: Self-pay | Admitting: Cardiology

## 2024-01-26 ENCOUNTER — Ambulatory Visit: Admitting: Physical Therapy

## 2024-01-27 ENCOUNTER — Encounter: Payer: Self-pay | Admitting: Neurology

## 2024-01-30 ENCOUNTER — Ambulatory Visit: Attending: Neurology | Admitting: Physical Therapy

## 2024-01-30 ENCOUNTER — Encounter: Payer: Self-pay | Admitting: Cardiology

## 2024-01-30 VITALS — BP 101/76 | HR 97

## 2024-01-30 DIAGNOSIS — M6281 Muscle weakness (generalized): Secondary | ICD-10-CM | POA: Diagnosis present

## 2024-01-30 DIAGNOSIS — R2689 Other abnormalities of gait and mobility: Secondary | ICD-10-CM | POA: Diagnosis present

## 2024-01-30 DIAGNOSIS — R279 Unspecified lack of coordination: Secondary | ICD-10-CM | POA: Diagnosis present

## 2024-01-30 DIAGNOSIS — R2681 Unsteadiness on feet: Secondary | ICD-10-CM | POA: Insufficient documentation

## 2024-01-30 NOTE — Therapy (Signed)
 OUTPATIENT PHYSICAL THERAPY NEURO TREATMENT   Patient Name: Kelsey Friedman MRN: [id number] DOB:[birth year], 43 y.o., female Today's Date: 01/30/2024   PCP: Merl Star, MD REFERRING PROVIDER: Audrene Lease, NP  END OF SESSION:  PT End of Session - 01/30/24 0807     Visit Number 6    Number of Visits 13    Date for PT Re-Evaluation 02/16/24    Authorization Type [employer]    PT Start Time 0804    PT Stop Time 0845    PT Time Calculation (min) 41 min    Equipment Utilized During Treatment Gait belt    Activity Tolerance Patient tolerated treatment well    Behavior During Therapy WFL for tasks assessed/performed                 Past Medical History:  Diagnosis Date   Asthma    Cerebral aneurysm    Chiari I malformation (HCC)    HLD (hyperlipidemia)    HTN (hypertension)    Hypothyroidism    PE (pulmonary embolism)    SOB (shortness of breath)    Past Surgical History:  Procedure Laterality Date   CHOLECYSTECTOMY     PATENT FORAMEN OVALE(PFO) CLOSURE N/A 01/24/2024   Procedure: PATENT FORAMEN OVALE(PFO) CLOSURE;  Surgeon: Knox Perl, MD;  Location: [facility];  Service: Cardiovascular;  Laterality: N/A;   TONSILLECTOMY     Patient Active Problem List   Diagnosis Date Noted   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   History of panic attacks 12/24/2023   PFO (patent foramen ovale) 12/24/2023   Essential hypertension 12/24/2023   Hyperlipidemia 12/24/2023   Stroke determined by clinical assessment (HCC) 12/22/2023   Mild persistent asthma without complication 06/16/2017   Seasonal and perennial allergic rhinitis 04/29/2014   Pulmonary embolism (HCC) 10/28/2010   Extrinsic asthma 05/05/2010    ONSET DATE: 12/24/2023  REFERRING DIAG: I10 (ICD-10-CM) - Essential hypertension  THERAPY DIAG:  Other abnormalities of gait and mobility  Muscle weakness (generalized)  Unspecified lack of coordination  Rationale for Evaluation and Treatment:  Rehabilitation  SUBJECTIVE:                                                                                                                                                                                             SUBJECTIVE STATEMENT: Pt presents w/no AD. Reports doing well. Favoring her LLE due to incision on RLE. Reports she had a burning pain in her L thigh on Friday that lasted for about an hour. No falls.   Pt accompanied by: Husband, Kelsey Friedman   PERTINENT HISTORY: SVT, HTN, PE, asthma,  hypothyroidism, PFO, stroke-like symptoms on 12/22/23   PAIN:  Are you having pain? No and pt reports occasional sinus pain and neck pain   PRECAUTIONS: Fall  RED FLAGS: None   WEIGHT BEARING RESTRICTIONS: No  FALLS: Has patient fallen in last 6 months? 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   PATIENT GOALS: " To strengthen my L side back up and no walker and to go back to normal"   OBJECTIVE:  Note: Objective measures were completed at Evaluation unless otherwise noted.  DIAGNOSTIC FINDINGS: MRI of brain from 12/23/23  IMPRESSION: 1. No acute intracranial abnormality. 2. Single subcortical T2 hyperintensity in the left parietal lobe. The finding is nonspecific but can be seen in the setting of chronic microvascular ischemia, a demyelinating process such as multiple sclerosis, vasculitis, complicated migraine headaches, or as the sequelae of a prior infectious or inflammatory process. 3. 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 The tonsils are somewhat pointed and mild crowding of the foramen magnum is present.   COGNITION: Overall cognitive status:  Difficulty word finding - delayed speech    SENSATION: Pt reports numbness in L face and L leg   COORDINATION: Heel to shin: WNL on RLE, unable to obtain full ROM on LLE but no ataxia noted  Finger to nose test: WNL on R, slowed but accurate on L   MUSCLE TONE: RLE: Pt reports frequent "shaking" in R hemibody w/fatigue    POSTURE: No Significant postural limitations  LOWER EXTREMITY ROM:     Active  Right Eval Left Eval  Hip flexion    Hip extension    Hip abduction    Hip adduction    Hip internal rotation    Hip external rotation    Knee flexion    Knee extension    Ankle dorsiflexion    Ankle plantarflexion    Ankle inversion    Ankle eversion     (Blank rows = not tested)  LOWER EXTREMITY MMT:  Tested in seated position   MMT Right Eval Left Eval  Hip flexion 5 2+  Hip extension    Hip abduction 5 3-  Hip adduction 5 2+  Hip internal rotation    Hip external rotation    Knee flexion 5 3-  Knee extension 5 3-  Ankle dorsiflexion 5 2+  Ankle plantarflexion    Ankle inversion    Ankle eversion    (Blank rows = not tested)  BED MOBILITY:  Independent per pt   TRANSFERS: Sit to stand: SBA  Assistive device utilized: Environmental consultant - 2 wheeled     Stand to sit: SBA  Assistive device utilized: Environmental consultant - 2 wheeled     Very slow initiation to stand, poor eccentric control w/reliance on BUE support to descend, weight shift to R   RAMP:  Not tested  CURB:  Not tested  STAIRS: Not tested GAIT: Gait pattern: step to pattern, decreased step length- Left, decreased stride length, decreased hip/knee flexion- Left, decreased ankle dorsiflexion- Left, lateral hip instability, lateral lean- Right, and poor foot clearance- Left Distance walked: various clinic distances  Assistive device utilized: Walker - 2 wheeled Level of  assistance: SBA Comments: Pt maintains LLE in extension w/heavy lean to R side    VITALS  Vitals:   01/30/24 0810 01/30/24 0822  BP: 98/77  101/76 Comment: After SciFit  Pulse: 92 97                                                                                                                                TREATMENT:    Self-care Assessed vitals (see above) and BP on lower end but pt reports this is normal for her  Educated pt briefly on desensitization techniques to use on LLE, but pt reports she did these when the burning sensation occurred. Pt reports it did help a bit   Ther Act  SciFit multi-peaks level 6.5 for 8 minutes using BUE/BLEs for neural priming for reciprocal movement, dynamic cardiovascular warmup and increased amplitude of stepping. Cued pt to maintain steps/min >70. RPE of 7.5/10 following activity. Reassessed vitals (see above) and WNL  Physical performance - STG assessment   [facility] PT Assessment - 01/30/24 0824       Transfers   Five time sit to stand comments  22.34s   no UE support, wide BOS     Standardized Balance Assessment   10 Meter Walk 0.53 m/s   24m over 18.68s no AD     Functional Gait  Assessment   Gait assessed  Yes    Gait Level Surface Walks 20 ft, slow speed, abnormal gait pattern, evidence for imbalance or deviates 10-15 in outside of the 12 in walkway width. Requires more than 7 sec to ambulate 20 ft.   11.06s   Change in Gait Speed Makes only minor adjustments to walking speed, or accomplishes a change in speed with significant gait deviations, deviates 10-15 in outside the 12 in walkway width, or changes speed but loses balance but is able to recover and continue walking.    Gait with Horizontal Head Turns Performs head turns smoothly with slight change in gait velocity (eg, minor disruption to smooth gait path), deviates 6-10 in outside 12 in walkway width, or uses an assistive device.    Gait with Vertical Head Turns Performs task with slight change in gait velocity (eg, minor disruption to smooth gait path), deviates 6 - 10 in outside 12 in walkway width or uses assistive device     Gait and Pivot Turn Pivot turns safely in greater than 3 sec and stops with no loss of balance, or pivot turns safely within 3 sec and stops with mild imbalance, requires small steps to catch balance.    Step Over Obstacle Is able to step over one shoe box (4.5 in total height) but must slow down and adjust steps to clear box safely. May require verbal cueing.    Gait with Narrow Base of Support Is able to ambulate for 10 steps heel to toe with no staggering.    Gait with Eyes Closed Walks 20 ft,  slow speed, abnormal gait pattern, evidence for imbalance, deviates 10-15 in outside 12 in walkway width. Requires more than 9 sec to ambulate 20 ft.   lateral drift to R; 10.37s   Ambulating Backwards Walks 20 ft, slow speed, abnormal gait pattern, evidence for imbalance, deviates 10-15 in outside 12 in walkway width.   35.06s   Steps Two feet to a stair, must use rail.    Total Score 15    FGA comment: High fall risk               PATIENT EDUCATION: Education details: OM results, continue HEP, desensitization techniques  Person educated: Patient and Spouse Education method: Explanation Education comprehension: verbalized understanding  HOME EXERCISE PROGRAM: To be established   GOALS: Goals reviewed with patient? Yes  SHORT TERM GOALS: Target date: 01/26/2024   Pt will be independent with initial HEP for improved strength, balance, transfers and gait.  Baseline: not established on eval   Goal status: MET  2.  Pt will improve 5 x STS to less than or equal to 60 seconds w/no UE support to demonstrate improved functional strength and transfer efficiency.   Baseline: 92.28s; 22.34s no UE support  Goal status: MET  3.  Pt will improve gait velocity to at least 0.5 m/s no AD for improved gait efficiency and independence  Baseline: 0.27 m/s no AD (5/12); 0.53 m/s no AD (6/2) Goal status: MET   4.  FGA to be assessed and LTG updated  Baseline:  Goal status: MET    LONG TERM  GOALS: Target date: 02/09/2024   Pt will be independent with final gym-based HEP for improved strength, balance, transfers and gait.  Baseline:  Goal status: INITIAL  2.  Pt will improve 5 x STS to less than or equal to 12 seconds w/no UE support to demonstrate improved functional strength and transfer efficiency.   Baseline: 92.28s w/BUE support; 22.34s no UE support (6/2)  Goal status: REVISED   3.  Pt will improve gait velocity to at least 0.8 m/s for improved gait efficiency and reduced fall risk   Baseline: 0.27 m/s no AD (5/12)  Goal status: REVISED   4.  Pt will improve FGA to 24/30 for decreased fall risk   Baseline: 15/30 Goal status: REVISED    ASSESSMENT:  CLINICAL IMPRESSION: Emphasis of skilled PT session on STG assessment, functional endurance and assessing vitals. Pt favoring LLE this date due to soreness in RLE following heart procedure last week. Pt has met 4/4 STGs, verbalizing independence w/HEP and demonstrating significant improvement in gait speed and 5x STS compared to eval. Pt scored a 15/30 on FGA, indicative of high fall risk. Pt most challenged by slow speed and fear-avoidance behavior. Noted pt leading w/LLE on stair navigation despite reports of LLE weakness. Encouraged pt to work on ascending/descending stairs at home to boost confidence and work on eccentric control of BLEs. Pt continues to walk w/guarded pattern but overall demonstrates more symmetrical step length and weight shift w/BLEs compared to eval. Continue POC.    OBJECTIVE IMPAIRMENTS: Abnormal gait, cardiopulmonary status limiting activity, decreased activity tolerance, decreased coordination, decreased knowledge of use of DME, decreased mobility, difficulty walking, decreased strength, impaired sensation, impaired tone, impaired UE functional use, improper body mechanics, and pain.   ACTIVITY LIMITATIONS: carrying, lifting, squatting, stairs, bathing, locomotion level, and caring for  others  PARTICIPATION LIMITATIONS: meal prep, cleaning, laundry, interpersonal relationship, driving, shopping, community activity, occupation, and yard work  PERSONAL FACTORS: Fitness,  Past/current experiences, and 1-2 comorbidities: Chiari I malformation and  are also affecting patient's functional outcome.   REHAB POTENTIAL: Good  CLINICAL DECISION MAKING: Evolving/moderate complexity  EVALUATION COMPLEXITY: Moderate  PLAN:  PT FREQUENCY: 1-2x/week (due to copay)  PT DURATION: 6 weeks  PLANNED INTERVENTIONS: 09811- PT Re-evaluation, 97750- Physical Performance Testing, 97110-Therapeutic exercises, 97530- Therapeutic activity, [id number]- Neuromuscular re-education, 97535- Self Care, 91478- Manual therapy, [id number]- Gait training, [phone]- Orthotic Initial, [phone]- Orthotic/Prosthetic subsequent, [phone]- Electrical stimulation (manual), Patient/Family education, Balance training, Stair training, Dry Needling, Joint mobilization, Spinal mobilization, Vestibular training, and DME instructions  PLAN FOR NEXT SESSION: Monitor vitals. TM training, lifting, squats, functional fitness, hip flexion in shortened position. Jumping, plyometrics, jump rope    Camilah Spillman E Briley Sulton, PT, DPT 01/30/2024, 8:46 AM

## 2024-01-31 ENCOUNTER — Ambulatory Visit: Admitting: Cardiovascular Disease

## 2024-01-31 NOTE — Telephone Encounter (Signed)
 Spoke w/Pt regarding burning sensation on left thigh. Stated it lasted about 1.5 hours. Burning sensation in intermittent. Pt had a cardiac procedure 01/24/24 but the procedure involved right side entry at groin area. Pt states the sensation is not bothering her when doing therapy but feels it more when legs are elevated at sitting or in bed. States the burning sensation occurs in different locations on left leg. The pain in her left foot is different and occurs when standing or sitting. Recommended Pt also contact cardiology office and informed her will send a message to Dr. Janett Medin for any recommendations he may have as well. Pt voiced understanding and thankful for call.

## 2024-01-31 NOTE — Telephone Encounter (Signed)
 FYI

## 2024-02-01 ENCOUNTER — Ambulatory Visit: Admitting: Physical Therapy

## 2024-02-01 VITALS — BP 111/81 | HR 97

## 2024-02-01 DIAGNOSIS — R279 Unspecified lack of coordination: Secondary | ICD-10-CM

## 2024-02-01 DIAGNOSIS — R2689 Other abnormalities of gait and mobility: Secondary | ICD-10-CM | POA: Diagnosis not present

## 2024-02-01 DIAGNOSIS — R2681 Unsteadiness on feet: Secondary | ICD-10-CM

## 2024-02-01 DIAGNOSIS — M6281 Muscle weakness (generalized): Secondary | ICD-10-CM

## 2024-02-01 NOTE — Therapy (Signed)
 OUTPATIENT PHYSICAL THERAPY NEURO TREATMENT   Patient Name: Kelsey Friedman MRN: [id number] DOB:1980-10-25, 43 y.o., female Today's Date: 02/01/2024   PCP: Merl Star, MD REFERRING PROVIDER: Audrene Lease, NP  END OF SESSION:  PT End of Session - 02/01/24 0806     Visit Number 7    Number of Visits 13    Date for PT Re-Evaluation 02/16/24    Authorization Type [employer]    PT Start Time 0804   Pt arrived late   PT Stop Time 0844    PT Time Calculation (min) 40 min    Equipment Utilized During Treatment Gait belt    Activity Tolerance Patient tolerated treatment well    Behavior During Therapy WFL for tasks assessed/performed                 Past Medical History:  Diagnosis Date   Asthma    Cerebral aneurysm    Chiari I malformation (HCC)    HLD (hyperlipidemia)    HTN (hypertension)    Hypothyroidism    PE (pulmonary embolism)    SOB (shortness of breath)    Past Surgical History:  Procedure Laterality Date   CHOLECYSTECTOMY     PATENT FORAMEN OVALE(PFO) CLOSURE N/A 01/24/2024   Procedure: PATENT FORAMEN OVALE(PFO) CLOSURE;  Surgeon: Knox Perl, MD;  Location: [facility];  Service: Cardiovascular;  Laterality: N/A;   TONSILLECTOMY     Patient Active Problem List   Diagnosis Date Noted   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   History of panic attacks 12/24/2023   PFO (patent foramen ovale) 12/24/2023   Essential hypertension 12/24/2023   Hyperlipidemia 12/24/2023   Stroke determined by clinical assessment (HCC) 12/22/2023   Mild persistent asthma without complication 06/16/2017   Seasonal and perennial allergic rhinitis 04/29/2014   Pulmonary embolism (HCC) 10/28/2010   Extrinsic asthma 05/05/2010    ONSET DATE: 12/24/2023  REFERRING DIAG: I10 (ICD-10-CM) - Essential hypertension  THERAPY DIAG:  Other abnormalities of gait and mobility  Muscle weakness (generalized)  Unsteadiness on feet  Unspecified lack of  coordination  Rationale for Evaluation and Treatment: Rehabilitation  SUBJECTIVE:                                                                                                                                                                                             SUBJECTIVE STATEMENT: Pt presents w/no AD. Reports ongoing pain in RLE, rating as a 6.5/10. No falls or acute changes. Did not work on stairs at home due to [facility] not being available to help.   Pt accompanied by: Husband, [name]  PERTINENT HISTORY: SVT, HTN, PE, asthma, hypothyroidism, PFO, stroke-like symptoms on 12/22/23   PAIN:  Are you having pain? Yes, in RLE incision site rating as 6.5/10  PRECAUTIONS: Fall  RED FLAGS: None   WEIGHT BEARING RESTRICTIONS: No  FALLS: Has patient fallen in last 6 months? 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   PATIENT GOALS: " To strengthen my L side back up and no walker and to go back to normal"   OBJECTIVE:  Note: Objective measures were completed at Evaluation unless otherwise noted.  DIAGNOSTIC FINDINGS: MRI of brain from 12/23/23  IMPRESSION: 1. No acute intracranial abnormality. 2. Single subcortical T2 hyperintensity in the left parietal lobe. The finding is nonspecific but can be seen in the setting of chronic microvascular ischemia, a demyelinating process such as multiple sclerosis, vasculitis, complicated migraine headaches, or as the sequelae of a prior infectious or inflammatory process. 3. 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 The tonsils are somewhat pointed and mild crowding of the foramen magnum is  present.   COGNITION: Overall cognitive status: Difficulty word finding - delayed speech    SENSATION: Pt reports numbness in L face and L leg   COORDINATION: Heel to shin: WNL on RLE, unable to obtain full ROM on LLE but no ataxia noted  Finger to nose test: WNL on R, slowed but accurate on L   MUSCLE TONE: RLE: Pt reports frequent "shaking" in R hemibody w/fatigue    POSTURE: No Significant postural limitations  LOWER EXTREMITY ROM:     Active  Right Eval Left Eval  Hip flexion    Hip extension    Hip abduction    Hip adduction    Hip internal rotation    Hip external rotation    Knee flexion    Knee extension    Ankle dorsiflexion    Ankle plantarflexion    Ankle inversion    Ankle eversion     (Blank rows = not tested)  LOWER EXTREMITY MMT:  Tested in seated position   MMT Right Eval Left Eval  Hip flexion 5 2+  Hip extension    Hip abduction 5 3-  Hip adduction 5 2+  Hip internal rotation    Hip external rotation    Knee flexion 5 3-  Knee extension 5 3-  Ankle dorsiflexion 5 2+  Ankle plantarflexion    Ankle inversion    Ankle eversion    (Blank rows = not tested)  BED MOBILITY:  Independent per pt   TRANSFERS: Sit to stand: SBA  Assistive device utilized: Environmental consultant - 2 wheeled     Stand to sit: SBA  Assistive device utilized: Environmental consultant - 2 wheeled     Very slow initiation to stand, poor eccentric control w/reliance on BUE support to descend, weight shift to R   RAMP:  Not tested  CURB:  Not tested  STAIRS: Not tested GAIT: Gait pattern: step to pattern, decreased step length- Left, decreased stride length, decreased hip/knee flexion- Left, decreased ankle dorsiflexion- Left, lateral hip instability, lateral lean- Right, and poor foot clearance- Left Distance walked: various clinic distances  Assistive device utilized: Environmental consultant - 2  wheeled Level of assistance: SBA Comments: Pt maintains LLE in extension w/heavy lean to R side    VITALS   Vitals:   02/01/24 0808  BP: 111/81  Pulse: 97                                                                                                                                 TREATMENT:    Self-care Assessed vitals (see above) and WNL  Ther Act  SciFit multi-peaks level 8 for 8 minutes using BUE/BLEs for neural priming for reciprocal movement, dynamic cardiovascular warmup and increased amplitude of stepping. Cued pt to maintain steps/min >70. RPE of 7.5/10 following activity.   NMR  At ballet bar for improved single leg stability, plyometrics and speed:  Lateral jumping over 2# dowel w/BUE support, x6 reps per side. Pt initially performing w/knees locked in extension, resulting in hitting dowel and requiring CGA for stability. Min cues to facilitate knee flexion w/movement and pt able to clear dowel well for remainder of reps. Also noted significant truncal flexion w/movement, so cued for upright posture.  Stance jacks w/BUE support, x10 reps. No difficulty or instability w/movement. Min cues to maintain upright posture throughout.  Mtn climbs, x10 reps per side slow and x3 reps per side fast, w/BUE support. Pt able to perform more quickly if cued to jump, but if self-selected speed, pt moves very slowly.   4 Blaze pods on random reach setting w/bungee around pt's pelvis anchored to ballet bar for improved power, stability and eccentric control.  Performed on 2 minute intervals with 60s rest periods.  Pt requires SBA guarding. Round 1:  4 pods placed from 9-3 o'clock on floor w/cues to tap pods w/hands. 14 hits. Round 2:  same setup.  10 hits. Notable errors/deficits:  RPE of 8.5/10  Gait x115' w/posterior resistance via bungee around pelvis w/CGA. Pt required min cues to maintain speed and use legs to propel forward rather than lean forward.     PATIENT EDUCATION: Education details: Continue HEP, work on stairs at home  Person educated: Patient and Spouse Education method:  Explanation Education comprehension: verbalized understanding  HOME EXERCISE PROGRAM: To be established   GOALS: Goals reviewed with patient? Yes  SHORT TERM GOALS: Target date: 01/26/2024   Pt will be independent with initial HEP for improved strength, balance, transfers and gait.  Baseline: not established on eval   Goal status: MET  2.  Pt will improve 5 x STS to less than or equal to 60 seconds w/no UE support to demonstrate improved functional strength and transfer efficiency.   Baseline: 92.28s; 22.34s no UE support  Goal status: MET  3.  Pt will improve gait velocity to at least 0.5 m/s no AD for improved gait efficiency and independence  Baseline: 0.27 m/s no AD (5/12); 0.53 m/s no AD (6/2) Goal status: MET   4.  FGA to be assessed and LTG updated  Baseline:  Goal status: MET    LONG TERM GOALS: Target date: 02/09/2024   Pt will be independent with final gym-based HEP for improved strength, balance, transfers and gait.  Baseline:  Goal status: INITIAL  2.  Pt will improve 5 x STS to less than or equal to 12 seconds w/no UE support to demonstrate improved functional strength and transfer efficiency.   Baseline: 92.28s w/BUE support; 22.34s no UE support (6/2)  Goal status: REVISED   3.  Pt will improve gait velocity to at least 0.8 m/s for improved gait efficiency and reduced fall risk   Baseline: 0.27 m/s no AD (5/12)  Goal status: REVISED   4.  Pt will improve FGA to 24/30 for decreased fall risk   Baseline: 15/30 Goal status: REVISED    ASSESSMENT:  CLINICAL IMPRESSION: Emphasis of skilled PT session on plyometrics, single leg stability and increased velocity of movement. Pt continues to report discomfort at incision site of RLE but tolerated session well w/no pain reported w/activity. Pt able to facilitate increased speed of movement if cued, but continues to self-select very slow, guarded speed despite no balance deficits. Continue POC.     OBJECTIVE IMPAIRMENTS: Abnormal gait, cardiopulmonary status limiting activity, decreased activity tolerance, decreased coordination, decreased knowledge of use of DME, decreased mobility, difficulty walking, decreased strength, impaired sensation, impaired tone, impaired UE functional use, improper body mechanics, and pain.   ACTIVITY LIMITATIONS: carrying, lifting, squatting, stairs, bathing, locomotion level, and caring for others  PARTICIPATION LIMITATIONS: meal prep, cleaning, laundry, interpersonal relationship, driving, shopping, community activity, occupation, and yard work  PERSONAL FACTORS: Fitness, Past/current experiences, and 1-2 comorbidities: Chiari I malformation and  are also affecting patient's functional outcome.   REHAB POTENTIAL: Good  CLINICAL DECISION MAKING: Evolving/moderate complexity  EVALUATION COMPLEXITY: Moderate  PLAN:  PT FREQUENCY: 1-2x/week (due to copay)  PT DURATION: 6 weeks  PLANNED INTERVENTIONS: 16109- PT Re-evaluation, 97750- Physical Performance Testing, 97110-Therapeutic exercises, 97530- Therapeutic activity, [id number]- Neuromuscular re-education, 97535- Self Care, [id number]- Manual therapy, [id number]- Gait training, [phone]- Orthotic Initial, [phone]- Orthotic/Prosthetic subsequent, [phone]- Electrical stimulation (manual), Patient/Family education, Balance training, Stair training, Dry Needling, Joint mobilization, Spinal mobilization, Vestibular training, and DME instructions  PLAN FOR NEXT SESSION: Monitor vitals. TM training, lifting, squats, functional fitness, hip flexion in shortened position. Jumping, plyometrics, jump rope    Myesha Stillion E Nayali Talerico, PT, DPT 02/01/2024, 8:44 AM

## 2024-02-06 ENCOUNTER — Ambulatory Visit: Admitting: Physical Therapy

## 2024-02-06 DIAGNOSIS — R2689 Other abnormalities of gait and mobility: Secondary | ICD-10-CM | POA: Diagnosis not present

## 2024-02-06 DIAGNOSIS — M6281 Muscle weakness (generalized): Secondary | ICD-10-CM

## 2024-02-06 DIAGNOSIS — R2681 Unsteadiness on feet: Secondary | ICD-10-CM

## 2024-02-06 DIAGNOSIS — R279 Unspecified lack of coordination: Secondary | ICD-10-CM

## 2024-02-06 NOTE — Therapy (Signed)
 OUTPATIENT PHYSICAL THERAPY NEURO TREATMENT   Patient Name: Kelsey Friedman MRN: [id number] DOB:September 07, 1980, 43 y.o., female Today's Date: 02/06/2024   PCP: Merl Star, MD REFERRING PROVIDER: Audrene Lease, NP  END OF SESSION:  PT End of Session - 02/06/24 1535     Visit Number 8    Number of Visits 13    Date for PT Re-Evaluation 02/16/24    Authorization Type [employer]    PT Start Time 1533    PT Stop Time 1614    PT Time Calculation (min) 41 min    Equipment Utilized During Treatment Gait belt    Activity Tolerance Patient tolerated treatment well    Behavior During Therapy WFL for tasks assessed/performed                  Past Medical History:  Diagnosis Date   Asthma    Cerebral aneurysm    Chiari I malformation (HCC)    HLD (hyperlipidemia)    HTN (hypertension)    Hypothyroidism    PE (pulmonary embolism)    SOB (shortness of breath)    Past Surgical History:  Procedure Laterality Date   CHOLECYSTECTOMY     PATENT FORAMEN OVALE(PFO) CLOSURE N/A 01/24/2024   Procedure: PATENT FORAMEN OVALE(PFO) CLOSURE;  Surgeon: Knox Perl, MD;  Location: [facility];  Service: Cardiovascular;  Laterality: N/A;   TONSILLECTOMY     Patient Active Problem List   Diagnosis Date Noted   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   History of panic attacks 12/24/2023   PFO (patent foramen ovale) 12/24/2023   Essential hypertension 12/24/2023   Hyperlipidemia 12/24/2023   Stroke determined by clinical assessment (HCC) 12/22/2023   Mild persistent asthma without complication 06/16/2017   Seasonal and perennial allergic rhinitis 04/29/2014   Pulmonary embolism (HCC) 10/28/2010   Extrinsic asthma 05/05/2010    ONSET DATE: 12/24/2023  REFERRING DIAG: I10 (ICD-10-CM) - Essential hypertension  THERAPY DIAG:  Other abnormalities of gait and mobility  Muscle weakness (generalized)  Unsteadiness on feet  Unspecified lack of coordination  Rationale for  Evaluation and Treatment: Rehabilitation  SUBJECTIVE:                                                                                                                                                                                             SUBJECTIVE STATEMENT: Pt presents w/no AD. Reports feeling like her RLE is now weaker than her LLE. Does not know why. Went out in public w/no AD yesterday for the first time, went well.   Pt accompanied by: Husband, [name] and daughter    PERTINENT  HISTORY: SVT, HTN, PE, asthma, hypothyroidism, PFO, stroke-like symptoms on 12/22/23   PAIN:  Are you having pain? Yes, in RLE incision site rating as 6.5/10  PRECAUTIONS: Fall  RED FLAGS: None   WEIGHT BEARING RESTRICTIONS: No  FALLS: Has patient fallen in last 6 months? 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   PATIENT GOALS: " To strengthen my L side back up and no walker and to go back to normal"   OBJECTIVE:  Note: Objective measures were completed at Evaluation unless otherwise noted.  DIAGNOSTIC FINDINGS: MRI of brain from 12/23/23  IMPRESSION: 1. No acute intracranial abnormality. 2. Single subcortical T2 hyperintensity in the left parietal lobe. The finding is nonspecific but can be seen in the setting of chronic microvascular ischemia, a demyelinating process such as multiple sclerosis, vasculitis, complicated migraine headaches, or as the sequelae of a prior infectious or inflammatory process. 3. 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 The tonsils are somewhat pointed and mild crowding of the foramen magnum is present.   COGNITION: Overall  cognitive status: Difficulty word finding - delayed speech    SENSATION: Pt reports numbness in L face and L leg   COORDINATION: Heel to shin: WNL on RLE, unable to obtain full ROM on LLE but no ataxia noted  Finger to nose test: WNL on R, slowed but accurate on L   MUSCLE TONE: RLE: Pt reports frequent "shaking" in R hemibody w/fatigue    POSTURE: No Significant postural limitations  LOWER EXTREMITY ROM:     Active  Right Eval Left Eval  Hip flexion    Hip extension    Hip abduction    Hip adduction    Hip internal rotation    Hip external rotation    Knee flexion    Knee extension    Ankle dorsiflexion    Ankle plantarflexion    Ankle inversion    Ankle eversion     (Blank rows = not tested)  LOWER EXTREMITY MMT:  Tested in seated position   MMT Right Eval Left Eval  Hip flexion 5 2+  Hip extension    Hip abduction 5 3-  Hip adduction 5 2+  Hip internal rotation    Hip external rotation    Knee flexion 5 3-  Knee extension 5 3-  Ankle dorsiflexion 5 2+  Ankle plantarflexion    Ankle inversion    Ankle eversion    (Blank rows = not tested)  BED MOBILITY:  Independent per pt   TRANSFERS: Sit to stand: SBA  Assistive device utilized: Environmental consultant - 2 wheeled     Stand to sit: SBA  Assistive device utilized: Environmental consultant - 2 wheeled     Very slow initiation to stand, poor eccentric control w/reliance on BUE support to descend, weight shift to R   RAMP:  Not tested  CURB:  Not tested  STAIRS: Not tested GAIT: Gait pattern: step to pattern, decreased step length- Left, decreased stride length, decreased hip/knee flexion- Left, decreased ankle dorsiflexion- Left, lateral hip instability, lateral lean- Right, and poor foot clearance- Left Distance walked: various clinic distances  Assistive device utilized: Environmental consultant - 2 wheeled  Level of assistance: SBA Comments: Pt maintains LLE in extension w/heavy lean to R side    VITALS  There were no vitals filed for this  visit.                                                                                                                                TREATMENT:    NMR  Ambulate 345' w/arm swing  Jogging over 30' x2 down and back  Agility ladder  Fast feet - two feet per square, x3 reps down and back  Fast feet- two squares forward, one square back x2 reps down and back Jumping  Lateral in/out  Jumping in/out      PATIENT EDUCATION: Education details: Continue HEP, work on stairs at home  Person educated: Patient and Spouse Education method: Explanation Education comprehension: verbalized understanding  HOME EXERCISE PROGRAM: To be established   GOALS: Goals reviewed with patient? Yes  SHORT TERM GOALS: Target date: 01/26/2024   Pt will be independent with initial HEP for improved strength, balance, transfers and gait.  Baseline: not established on eval   Goal status: MET  2.  Pt will improve 5 x STS to less than or equal to 60 seconds w/no UE support to demonstrate improved functional strength and transfer efficiency.   Baseline: 92.28s; 22.34s no UE support  Goal status: MET  3.  Pt will improve gait velocity to at least 0.5 m/s no AD for improved gait efficiency and independence  Baseline: 0.27 m/s no AD (5/12); 0.53 m/s no AD (6/2) Goal status: MET   4.  FGA to be assessed and LTG updated  Baseline:  Goal status: MET    LONG TERM GOALS: Target date: 02/09/2024   Pt will be independent with final gym-based HEP for improved strength, balance, transfers and gait.  Baseline:  Goal status: INITIAL  2.  Pt will improve 5 x STS to less than or equal to 12 seconds w/no UE support to demonstrate improved functional strength and transfer efficiency.   Baseline: 92.28s w/BUE support; 22.34s no UE support (6/2)  Goal status: REVISED   3.  Pt will improve gait velocity to at least 0.8 m/s for improved gait efficiency and reduced fall risk   Baseline: 0.27 m/s no AD (5/12)   Goal status: REVISED   4.  Pt will improve FGA to 24/30 for decreased fall risk   Baseline: 15/30 Goal status: REVISED    ASSESSMENT:  CLINICAL IMPRESSION: Emphasis of skilled PT session on plyometrics, single leg stability and increased velocity of movement. Pt continues to report discomfort at incision site of RLE but tolerated session well w/no pain reported w/activity. Pt able to facilitate increased speed of movement if cued, but continues to self-select very slow, guarded speed despite no balance deficits. Continue POC.    OBJECTIVE IMPAIRMENTS: Abnormal gait, cardiopulmonary status limiting activity, decreased activity tolerance, decreased coordination, decreased knowledge of use of DME, decreased  mobility, difficulty walking, decreased strength, impaired sensation, impaired tone, impaired UE functional use, improper body mechanics, and pain.   ACTIVITY LIMITATIONS: carrying, lifting, squatting, stairs, bathing, locomotion level, and caring for others  PARTICIPATION LIMITATIONS: meal prep, cleaning, laundry, interpersonal relationship, driving, shopping, community activity, occupation, and yard work  PERSONAL FACTORS: Fitness, Past/current experiences, and 1-2 comorbidities: Chiari I malformation and  are also affecting patient's functional outcome.   REHAB POTENTIAL: Good  CLINICAL DECISION MAKING: Evolving/moderate complexity  EVALUATION COMPLEXITY: Moderate  PLAN:  PT FREQUENCY: 1-2x/week (due to copay)  PT DURATION: 6 weeks  PLANNED INTERVENTIONS: 09811- PT Re-evaluation, 97750- Physical Performance Testing, 97110-Therapeutic exercises, 97530- Therapeutic activity, [id number]- Neuromuscular re-education, 97535- Self Care, 91478- Manual therapy, [id number]- Gait training, [phone]- Orthotic Initial, [phone]- Orthotic/Prosthetic subsequent, [phone]- Electrical stimulation (manual), Patient/Family education, Balance training, Stair training, Dry Needling, Joint mobilization, Spinal  mobilization, Vestibular training, and DME instructions  PLAN FOR NEXT SESSION: Monitor vitals. TM training, lifting, squats, functional fitness, hip flexion in shortened position. Jumping, plyometrics, jump rope    Kenyatta Gloeckner E Darcell Sabino, PT, DPT 02/06/2024, 4:14 PM

## 2024-02-08 ENCOUNTER — Ambulatory Visit: Admitting: Physical Therapy

## 2024-02-10 ENCOUNTER — Encounter: Payer: Self-pay | Admitting: Emergency Medicine

## 2024-02-10 ENCOUNTER — Ambulatory Visit: Attending: Emergency Medicine | Admitting: Emergency Medicine

## 2024-02-10 VITALS — BP 108/82 | HR 84 | Ht 64.0 in | Wt 169.4 lb

## 2024-02-10 DIAGNOSIS — J4520 Mild intermittent asthma, uncomplicated: Secondary | ICD-10-CM

## 2024-02-10 DIAGNOSIS — I471 Supraventricular tachycardia, unspecified: Secondary | ICD-10-CM

## 2024-02-10 DIAGNOSIS — G8194 Hemiplegia, unspecified affecting left nondominant side: Secondary | ICD-10-CM | POA: Diagnosis not present

## 2024-02-10 DIAGNOSIS — Q2112 Patent foramen ovale: Secondary | ICD-10-CM

## 2024-02-10 DIAGNOSIS — I10 Essential (primary) hypertension: Secondary | ICD-10-CM

## 2024-02-10 DIAGNOSIS — E785 Hyperlipidemia, unspecified: Secondary | ICD-10-CM

## 2024-02-10 MED ORDER — ATORVASTATIN CALCIUM 40 MG PO TABS: 40.0000 mg | ORAL_TABLET | Freq: Every day | ORAL | 3 refills | Status: AC

## 2024-02-10 NOTE — Patient Instructions (Addendum)
 Medication Instructions:  STOP TAKING PLAVIX  IN 1 MONTH. (ON OR AROUND March 11, 2024) CONTINUE TO TAKE ASPIRIN  81 MG DAILY. CONTINUE ALL OTHER CURRENT MEDICATION THERAPY.  Lab Work: NONE   Testing/Procedures: NONE  Follow-Up: At [facility], you and your health needs are our priority.  As part of our continuing mission to provide you with exceptional heart care, our providers are all part of one team.  This team includes your primary Cardiologist (physician) and Advanced Practice Providers or APPs (Physician Assistants and Nurse Practitioners) who all work together to provide you with the care you need, when you need it.  Your next appointment:   KEEP SCHEDULED APPOINTMENT WITH DR. GANJI IN [place].  Provider:   Knox Perl, MD

## 2024-02-10 NOTE — Progress Notes (Unsigned)
 Cardiology Office Note:    Date:  02/10/2024  ID:  Laurance Pollen, DOB [birth year], MRN [id number] PCP: Merl Star, MD   [facility] Providers Cardiologist:  Knox Perl, MD { Click to update primary MD,subspecialty MD or APP then REFRESH:1}    {Click to Open Review  :1}   Patient Profile:       Chief Complaint: Follow-up PFO closure History of Present Illness:  Kelsey Friedman is a 43 y.o. female with visit-pertinent history of hypertension  She presented to the emergency room 12/22/2023 with chest discomfort, headache and left facial droop that then progressed to left hemiparesis, numbness and dysarthria diagnosed with embolic stroke clinically although MRI was negative, received TNK with improvement in deficits.  She was started on aspirin  along with Plavix  and discharged home.  Echocardiogram revealed PFO with right-to-left shunting, transcranial bubble study with positive PFO as well as Spencer 3-4 shunting at rest.  She was referred to cardiology for consideration of atrial septal defect repair.  She established with Dr. Berry Bristol on 01/02/2024 for evaluation of PFO.  It was thought that the PFO was likely because of her embolic stroke.  She was felt to be an ideal candidate for PFO closure due to her recent stroke and shunting observed on echocardiogram.  She underwent PFO closure on 01/24/2024.   Discussed the use of AI scribe software for clinical note transcription with the patient, who gave verbal consent to proceed.  Today patient notes she is doing well overall.  States she has no new or acute cardiovascular concerns or complaints after her PFO.  States that she feels well overall.  She does experience some episodes of tachycardia that she monitors with her Apple Watch.  Her highest heart rate over the past month was 130 bpm.  At times she will get some chest discomfort described as funny feeling but denies any exertional chest pains or angina.  As she is still working with  PT for stroke deficit.  She also notes working out on an elliptical and treadmill without any exertional chest pains or shortness of breath.  She denies any orthopnea, PND, leg swelling.  She tells me she has had improvement in her endurance and fatigue since the procedure.  She tells me surgical site been healing well.   Review of systems:  Please see the history of present illness. All other systems are reviewed and otherwise negative.      Studies Reviewed:        PFO closure 01/24/2024 Interventional data:  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 Post-deployment double contrast study revealed NO residual shunting.   Recommendation: Patient will continue with dual antiplatelet therapy with aspirin  and Plavix  for a period of 30 days, THEN ASA FOR ADDITIONAL 5 MONTHS. Will also need endocarditis prophylaxis for repair of 6 months.   Echocardiogram 12/23/2023  1. Left ventricular ejection fraction, by estimation, is 60 to 65%. The  left ventricle has normal function. The left ventricle has no regional  wall motion abnormalities. Left ventricular diastolic parameters were  normal.   2. Right ventricular systolic function is normal. The right ventricular  size is normal. Tricuspid regurgitation signal is inadequate for assessing  PA pressure.   3. The mitral valve is normal in structure. Trivial mitral valve  regurgitation. No evidence of mitral stenosis.   4. The aortic valve is normal in structure. Aortic valve regurgitation is  not  visualized. No aortic stenosis is present.   5. Aortic dilatation noted. There is borderline dilatation of the  ascending aorta, measuring 39 mm.   6. The inferior vena cava is normal in size with greater than 50%  respiratory variability, suggesting right atrial pressure of 3 mmHg.   7. Evidence of atrial level shunting detected by color flow Doppler.   Agitated saline contrast bubble study was positive with shunting observed  within 3-6 cardiac cycles suggestive of interatrial shunt.   Risk Assessment/Calculations:              Physical Exam:   VS:  BP 108/82   Pulse 84   Ht 5' 4 (1.626 m)   Wt 169 lb 6.4 oz (76.8 kg)   LMP 01/19/2024 (Exact Date)   SpO2 97%   BMI 29.08 kg/m    Wt Readings from Last 3 Encounters:  02/10/24 169 lb 6.4 oz (76.8 kg)  01/24/24 158 lb (71.7 kg)  01/02/24 161 lb (73 kg)    GEN: Well nourished, well developed in no acute distress NECK: No JVD; No carotid bruits CARDIAC: RRR, no murmurs, rubs, gallops RESPIRATORY:  Clear to auscultation without rales, wheezing or rhonchi  ABDOMEN: Soft, non-tender, non-distended EXTREMITIES:  No edema; No acute deformity      Assessment and Plan:  Patent foreman ovale Echocardiogram revealed PFO with right-to-left shunting, transcranial bubble study was positive for PFO as well with Spencer 3-4 shunting at rest S/p successful closure of atrial septal defect with implantation of cardioform 30 mm septal occluder.  Post deployment double contrast study revealed no residual shunting. - Doing well today and without acute concerns.  Remains euvolemic without anginal symptoms.  Has been exercising without exertional symptoms.  No new neurological deficits - Plan for patient to continue dual antiplatelet therapy with aspirin  and Plavix  for period of 30 days.  After 30 days she can discontinue Plavix  and continue ASA for an additional 5 months.  She will also require endocarditis prophylaxis for 6 months. - F/u with Dr. 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  Her symptoms have resolved with TNK administration but some residual weakness does remain MRI brain 11/2023 showed no acute intracranial abnormality Her PFO was likely the cause of her embolic stroke - Currently working with PT - Managed by neurology -  Will discontinue Plavix  in 1 months and ASA and 5 months  Hypertension Blood pressure today is 108/82 and well-controlled - Continue diltiazem  120 mg daily and losartan-hydrochlorothiazide 50-12.5 mg daily  Asthma Denies any recent exacerbations and managed with albuterol   Hyperlipidemia, LDL goal <70 LDL 103 on 11/2023 and started on atorvastatin  at that time - Recommend repeat lipid panel/LFTs at follow-up visit - Continue atorvastatin  40 mg daily  SVT ZIO 07/2022 with 1 short burst of SVT approximately 20 seconds (150 bpm) - Well-controlled on diltiazem  120 mg daily     Dispo:  Return in about 1 month (around 03/11/2024).  Signed, Ava Boatman, NP

## 2024-02-13 ENCOUNTER — Ambulatory Visit: Admitting: Physical Therapy

## 2024-02-13 DIAGNOSIS — R2689 Other abnormalities of gait and mobility: Secondary | ICD-10-CM

## 2024-02-13 DIAGNOSIS — R2681 Unsteadiness on feet: Secondary | ICD-10-CM

## 2024-02-13 DIAGNOSIS — M6281 Muscle weakness (generalized): Secondary | ICD-10-CM

## 2024-02-13 NOTE — Therapy (Signed)
 OUTPATIENT PHYSICAL THERAPY NEURO TREATMENT   Patient Name: Kelsey Friedman MRN: [id number] DOB:November 05, 1980, 43 y.o., female Today's Date: 02/13/2024   PCP: Merl Star, MD REFERRING PROVIDER: Audrene Lease, NP  END OF SESSION:  PT End of Session - 02/13/24 0805     Visit Number 9    Number of Visits 13    Date for PT Re-Evaluation 02/16/24    Authorization Type [employer]    PT Start Time 0803    PT Stop Time 0843    PT Time Calculation (min) 40 min    Equipment Utilized During Treatment --    Activity Tolerance Patient tolerated treatment well    Behavior During Therapy WFL for tasks assessed/performed               Past Medical History:  Diagnosis Date   Asthma    Cerebral aneurysm    Chiari I malformation (HCC)    HLD (hyperlipidemia)    HTN (hypertension)    Hypothyroidism    PE (pulmonary embolism)    SOB (shortness of breath)    Past Surgical History:  Procedure Laterality Date   CHOLECYSTECTOMY     PATENT FORAMEN OVALE(PFO) CLOSURE N/A 01/24/2024   Procedure: PATENT FORAMEN OVALE(PFO) CLOSURE;  Surgeon: Knox Perl, MD;  Location: [facility];  Service: Cardiovascular;  Laterality: N/A;   TONSILLECTOMY     Patient Active Problem List   Diagnosis Date Noted   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   History of panic attacks 12/24/2023   PFO (patent foramen ovale) 12/24/2023   Essential hypertension 12/24/2023   Hyperlipidemia 12/24/2023   Stroke determined by clinical assessment (HCC) 12/22/2023   Mild persistent asthma without complication 06/16/2017   Seasonal and perennial allergic rhinitis 04/29/2014   Pulmonary embolism (HCC) 10/28/2010   Extrinsic asthma 05/05/2010    ONSET DATE: 12/24/2023  REFERRING DIAG: I10 (ICD-10-CM) - Essential hypertension  THERAPY DIAG:  Other abnormalities of gait and mobility  Muscle weakness (generalized)  Unsteadiness on feet  Rationale for Evaluation and Treatment:  Rehabilitation  SUBJECTIVE:                                                                                                                                                                                             SUBJECTIVE STATEMENT: Pt presents w/no AD. Reports she did a lot last week, went to the gym 4x and walked a lot. No longer using a cane. No falls. Has days where she feels like she is a bit wobbly but does not know if this is just mental.   Pt accompanied by: Husband, [name]  PERTINENT HISTORY: SVT, HTN, PE, asthma, hypothyroidism, PFO, stroke-like symptoms on 12/22/23   PAIN:  Are you having pain? No  PRECAUTIONS: Fall  RED FLAGS: None   WEIGHT BEARING RESTRICTIONS: No  FALLS: Has patient fallen in last 6 months? 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   PATIENT GOALS:  To strengthen my L side back up and no walker and to go back to normal   OBJECTIVE:  Note: Objective measures were completed at Evaluation unless otherwise noted.  DIAGNOSTIC FINDINGS: MRI of brain from 12/23/23  IMPRESSION: 1. No acute intracranial abnormality. 2. Single subcortical T2 hyperintensity in the left parietal lobe. The finding is nonspecific but can be seen in the setting of chronic microvascular ischemia, a demyelinating process such as multiple sclerosis, vasculitis, complicated migraine headaches, or as the sequelae of a prior infectious or inflammatory process. 3. 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 The tonsils are somewhat pointed and mild crowding of the foramen magnum is present.   COGNITION: Overall cognitive status: Difficulty word  finding - delayed speech    SENSATION: Pt reports numbness in L face and L leg   COORDINATION: Heel to shin: WNL on RLE, unable to obtain full ROM on LLE but no ataxia noted  Finger to nose test: WNL on R, slowed but accurate on L   MUSCLE TONE: RLE: Pt reports frequent shaking in R hemibody w/fatigue    POSTURE: No Significant postural limitations  LOWER EXTREMITY ROM:     Active  Right Eval Left Eval  Hip flexion    Hip extension    Hip abduction    Hip adduction    Hip internal rotation    Hip external rotation    Knee flexion    Knee extension    Ankle dorsiflexion    Ankle plantarflexion    Ankle inversion    Ankle eversion     (Blank rows = not tested)  LOWER EXTREMITY MMT:  Tested in seated position   MMT Right Eval Left Eval  Hip flexion 5 2+  Hip extension    Hip abduction 5 3-  Hip adduction 5 2+  Hip internal rotation    Hip external rotation    Knee flexion 5 3-  Knee extension 5 3-  Ankle dorsiflexion 5 2+  Ankle plantarflexion    Ankle inversion    Ankle eversion    (Blank rows = not tested)  BED MOBILITY:  Independent per pt   TRANSFERS: Sit to stand: SBA  Assistive device utilized: Environmental consultant - 2 wheeled     Stand to sit: SBA  Assistive device utilized: Environmental consultant - 2 wheeled     Very slow initiation to stand, poor eccentric control w/reliance on BUE support to descend, weight shift to R   RAMP:  Not tested  CURB:  Not tested  STAIRS: Not tested GAIT: Gait pattern: step to pattern, decreased step length- Left, decreased stride length, decreased hip/knee flexion- Left, decreased ankle dorsiflexion- Left, lateral hip instability, lateral lean- Right, and poor foot clearance- Left Distance walked: various clinic distances  Assistive device utilized: Walker - 2 wheeled Level of assistance: SBA Comments: Pt  maintains LLE in extension w/heavy lean to R side    VITALS  There were no vitals filed for this visit.                                                                                                                                 TREATMENT:    Ther Act  Discussed POC moving forward and pt reports she would like to add a few more appointments at a frequency of 1x/week. Added 2 more weeks to POC once pt is back from vacation. Will do recertification next visit.   LTG Assessment   [facility] PT Assessment - 02/13/24 0812       Standardized Balance Assessment   10 Meter Walk 0.9 m/s   7m over 11.03s        Jump rope for improved plyometrics and LE coordination  Double leg jumps x45s  Alt leg jumps, 2x30s  Turkish get ups w/5# DB, x2 reps per side, for improved stability and functional BLE strength. Max verbal cues required on first rep, but pt then able to perform independently. RPE of 8/10  Modified burpees, x5 reps. Pt performed slowly but no LOB noted.  Jogging fwd/retro x2 each direction down 25' hallway. Noted initiation of arm swing in fwd direction w/reciprocal pattern but more of a step-to pattern in retro direction.  Jogging laterally x2 reps per direction. Increased difficulty jogging to R side vs L.  Karaoke, x1 rep down and back for improved LE coordination and speed. Significant difficulty coordinating LLE but no LOB.  RPE of 8/10    PATIENT EDUCATION: Education details: continue HEP, LTG results thus far, POC moving forward.  Person educated: Patient and Spouse Education method: Medical illustrator Education comprehension: verbalized understanding  HOME EXERCISE PROGRAM: To be established   GOALS: Goals reviewed with patient? Yes  SHORT TERM GOALS: Target date: 01/26/2024   Pt will be independent with initial HEP for improved strength, balance, transfers and gait.  Baseline: not established on eval   Goal status: MET  2.  Pt will improve 5 x STS to less than or equal to 60 seconds w/no UE support to demonstrate improved functional strength and transfer efficiency.   Baseline:  92.28s; 22.34s no UE support  Goal status: MET  3.  Pt will improve gait velocity to at least 0.5 m/s no AD for improved gait efficiency and independence  Baseline: 0.27 m/s no AD (5/12); 0.53 m/s no AD (6/2) Goal status: MET   4.  FGA to be assessed and LTG updated  Baseline:  Goal status: MET    LONG TERM GOALS: Target date: 02/09/2024   Pt will be independent with final gym-based HEP for improved strength, balance, transfers and gait.  Baseline:  Goal status: INITIAL  2.  Pt will improve 5 x STS to less than or equal to 12 seconds w/no UE support to demonstrate improved functional strength and transfer efficiency.  Baseline: 92.28s w/BUE support; 22.34s no UE support (6/2)  Goal status: REVISED   3.  Pt will improve gait velocity to at least 0.8 m/s for improved gait efficiency and reduced fall risk   Baseline: 0.27 m/s no AD (5/12); 0.9 m/s no AD Goal status: MET   4.  Pt will improve FGA to 24/30 for decreased fall risk   Baseline: 15/30 Goal status: REVISED    ASSESSMENT:  CLINICAL IMPRESSION: Emphasis of skilled PT session on initiating LTG assessment, plyometrics and LE coordination. Pt has met 1 LTG thus far, significantly improving her gait speed since previous assessment 2 weeks ago. Pt reports she is doing more at home and is no longer using her cane in community. Pt went to gym 4x last week and used a variety of equipment machines. Overall, pt is doing well but would like to add a few more PT visits to work on improved agility and velocity of movement as well as confidence w/return to work. Will do recertification next visit once she returns from vacation. Continue POC.    OBJECTIVE IMPAIRMENTS: Abnormal gait, cardiopulmonary status limiting activity, decreased activity tolerance, decreased coordination, decreased knowledge of use of DME, decreased mobility, difficulty walking, decreased strength, impaired sensation, impaired tone, impaired UE functional use,  improper body mechanics, and pain.   ACTIVITY LIMITATIONS: carrying, lifting, squatting, stairs, bathing, locomotion level, and caring for others  PARTICIPATION LIMITATIONS: meal prep, cleaning, laundry, interpersonal relationship, driving, shopping, community activity, occupation, and yard work  PERSONAL FACTORS: Fitness, Past/current experiences, and 1-2 comorbidities: Chiari I malformation and  are also affecting patient's functional outcome.   REHAB POTENTIAL: Good  CLINICAL DECISION MAKING: Evolving/moderate complexity  EVALUATION COMPLEXITY: Moderate  PLAN:  PT FREQUENCY: 1-2x/week (due to copay)  PT DURATION: 6 weeks  PLANNED INTERVENTIONS: 57846- PT Re-evaluation, 97750- Physical Performance Testing, 97110-Therapeutic exercises, 97530- Therapeutic activity, [id number]- Neuromuscular re-education, 97535- Self Care, [id number]- Manual therapy, [id number]- Gait training, [phone]- Orthotic Initial, [phone]- Orthotic/Prosthetic subsequent, [phone]- Electrical stimulation (manual), Patient/Family education, Balance training, Stair training, Dry Needling, Joint mobilization, Spinal mobilization, Vestibular training, and DME instructions  PLAN FOR NEXT SESSION: 10th visit, Goals and recert. Monitor vitals. TM training, lifting, squats, functional fitness, hip flexion in shortened position. Jumping, plyometrics, jump rope    Curtistine Downer Maika Mcelveen, PT, DPT 02/13/2024, 8:44 AM

## 2024-02-15 ENCOUNTER — Ambulatory Visit: Admitting: Physical Therapy

## 2024-02-22 ENCOUNTER — Encounter: Payer: Self-pay | Admitting: Cardiology

## 2024-02-22 DIAGNOSIS — R002 Palpitations: Secondary | ICD-10-CM

## 2024-02-22 NOTE — Telephone Encounter (Signed)
 Event monitor 2 weeks for palpitations

## 2024-02-23 ENCOUNTER — Ambulatory Visit: Attending: Cardiology

## 2024-02-23 DIAGNOSIS — R002 Palpitations: Secondary | ICD-10-CM

## 2024-02-23 NOTE — Telephone Encounter (Signed)
 Reviewed with Dr Ladona and order is 2 week non live zio

## 2024-02-23 NOTE — Progress Notes (Unsigned)
 Enrolled for [employer] to mail a ZIO XT long term holter monitor to the patients address on file.

## 2024-02-27 ENCOUNTER — Ambulatory Visit: Payer: Self-pay | Admitting: Physical Therapy

## 2024-02-27 DIAGNOSIS — R2681 Unsteadiness on feet: Secondary | ICD-10-CM

## 2024-02-27 DIAGNOSIS — R2689 Other abnormalities of gait and mobility: Secondary | ICD-10-CM

## 2024-02-27 DIAGNOSIS — M6281 Muscle weakness (generalized): Secondary | ICD-10-CM

## 2024-02-27 NOTE — Therapy (Signed)
 OUTPATIENT PHYSICAL THERAPY NEURO TREATMENT - 10TH VISIT PROGRESS NOTE, RECERTIFICATION AND DISCHARGE SUMMARY    Patient Name: Kelsey Friedman MRN: [id number] DOB:[birth year], 43 y.o., female Today's Date: 02/27/2024   PCP: Rexanne Ingle, MD REFERRING PROVIDER: Judithe Rocky BROCKS, NP  Physical Therapy Progress Note   Dates of Reporting Period:12/29/23 - 02/27/24  See Note below for Objective Data and Assessment of Progress/Goals.  PHYSICAL THERAPY DISCHARGE SUMMARY  Visits from Start of Care: 10  Current functional level related to goals / functional outcomes: Independent w/all ADLs   Remaining deficits: Low fall risk, functional component to mobility    Education / Equipment: HEP   Patient agrees to discharge. Patient goals were met. Patient is being discharged due to meeting the stated rehab goals.     END OF SESSION:  PT End of Session - 02/27/24 0805     Visit Number 10    Number of Visits 13    Date for PT Re-Evaluation 02/27/24   Recert   Authorization Type [employer]    PT Start Time 0804   Pt arrived late   PT Stop Time 0837   DC   PT Time Calculation (min) 33 min    Activity Tolerance Patient tolerated treatment well    Behavior During Therapy WFL for tasks assessed/performed                Past Medical History:  Diagnosis Date   Asthma    Cerebral aneurysm    Chiari I malformation (HCC)    HLD (hyperlipidemia)    HTN (hypertension)    Hypothyroidism    PE (pulmonary embolism)    SOB (shortness of breath)    Past Surgical History:  Procedure Laterality Date   CHOLECYSTECTOMY     PATENT FORAMEN OVALE(PFO) CLOSURE N/A 01/24/2024   Procedure: PATENT FORAMEN OVALE(PFO) CLOSURE;  Surgeon: Ladona Heinz, MD;  Location: [facility];  Service: Cardiovascular;  Laterality: N/A;   TONSILLECTOMY     Patient Active Problem List   Diagnosis Date Noted   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   History of panic attacks 12/24/2023   PFO (patent  foramen ovale) 12/24/2023   Essential hypertension 12/24/2023   Hyperlipidemia 12/24/2023   Stroke determined by clinical assessment (HCC) 12/22/2023   Mild persistent asthma without complication 06/16/2017   Seasonal and perennial allergic rhinitis 04/29/2014   Pulmonary embolism (HCC) 10/28/2010   Extrinsic asthma 05/05/2010    ONSET DATE: 12/24/2023  REFERRING DIAG: I10 (ICD-10-CM) - Essential hypertension  THERAPY DIAG:  Other abnormalities of gait and mobility  Muscle weakness (generalized)  Unsteadiness on feet  Rationale for Evaluation and Treatment: Rehabilitation  SUBJECTIVE:  SUBJECTIVE STATEMENT: Pt presents w/no AD. States she had a good vacation, was very tired but did not have any issues with mobility. No falls. Is ready to DC today.   Pt accompanied by: Husband, [name]  PERTINENT HISTORY: SVT, HTN, PE, asthma, hypothyroidism, PFO, stroke-like symptoms on 12/22/23   PAIN:  Are you having pain? No  PRECAUTIONS: Fall  RED FLAGS: None   WEIGHT BEARING RESTRICTIONS: No  FALLS: Has patient fallen in last 6 months? 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   PATIENT GOALS:  To strengthen my L side back up and no walker and to go back to normal   OBJECTIVE:  Note: Objective measures were completed at Evaluation unless otherwise noted.  DIAGNOSTIC FINDINGS: MRI of brain from 12/23/23  IMPRESSION: 1. No acute intracranial abnormality. 2. Single subcortical T2 hyperintensity in the left parietal lobe. The  finding is nonspecific but can be seen in the setting of chronic microvascular ischemia, a demyelinating process such as multiple sclerosis, vasculitis, complicated migraine headaches, or as the sequelae of a prior infectious or inflammatory process. 3. 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 The tonsils are somewhat pointed and mild crowding of the foramen magnum is present.   COGNITION: Overall cognitive status: Difficulty word finding - delayed speech    SENSATION: Pt reports numbness in L face and L leg   COORDINATION: Heel to shin: WNL on RLE, unable to obtain full ROM on LLE but no ataxia noted  Finger to nose test: WNL on R, slowed but accurate on L   MUSCLE TONE: RLE: Pt reports frequent shaking in R hemibody w/fatigue    POSTURE: No Significant postural limitations  LOWER EXTREMITY ROM:     Active  Right Eval Left Eval  Hip flexion    Hip extension    Hip abduction    Hip adduction    Hip internal rotation    Hip external rotation    Knee flexion    Knee extension    Ankle dorsiflexion    Ankle plantarflexion    Ankle inversion    Ankle eversion     (Blank rows = not tested)  LOWER EXTREMITY MMT:  Tested in seated position   MMT Right Eval Left Eval  Hip flexion 5 2+  Hip extension    Hip abduction 5 3-  Hip adduction 5 2+  Hip internal rotation    Hip external rotation    Knee flexion 5 3-  Knee extension 5 3-  Ankle dorsiflexion 5 2+  Ankle plantarflexion    Ankle inversion    Ankle eversion    (Blank rows = not tested)  BED MOBILITY:  Independent per pt   TRANSFERS: Sit to stand: SBA  Assistive device utilized: Environmental consultant - 2 wheeled     Stand to sit: SBA  Assistive device utilized: Environmental consultant - 2 wheeled     Very slow initiation to stand, poor eccentric control w/reliance on BUE support to descend, weight shift to R   RAMP:  Not tested  CURB:  Not tested  STAIRS: Not  tested GAIT: Gait pattern: step to pattern, decreased step length- Left, decreased stride  length, decreased hip/knee flexion- Left, decreased ankle dorsiflexion- Left, lateral hip instability, lateral lean- Right, and poor foot clearance- Left Distance walked: various clinic distances  Assistive device utilized: Walker - 2 wheeled Level of assistance: SBA Comments: Pt maintains LLE in extension w/heavy lean to R side    VITALS  There were no vitals filed for this visit.                                                                                                               TREATMENT:    Ther Act - LTG Assessment   [facility] PT Assessment - 02/27/24 0810       Transfers   Five time sit to stand comments  17.47s no UE support      Standardized Balance Assessment   10 Meter Walk 0.98 m/s   77m over 10.25s  no AD     Functional Gait  Assessment   Gait assessed  Yes    Gait Level Surface Walks 20 ft, slow speed, abnormal gait pattern, evidence for imbalance or deviates 10-15 in outside of the 12 in walkway width. Requires more than 7 sec to ambulate 20 ft.   7.37s   Change in Gait Speed Able to smoothly change walking speed without loss of balance or gait deviation. Deviate no more than 6 in outside of the 12 in walkway width.    Gait with Horizontal Head Turns Performs head turns smoothly with no change in gait. Deviates no more than 6 in outside 12 in walkway width    Gait with Vertical Head Turns Performs head turns with no change in gait. Deviates no more than 6 in outside 12 in walkway width.    Gait and Pivot Turn Pivot turns safely within 3 sec and stops quickly with no loss of balance.    Step Over Obstacle Is able to step over 2 stacked shoe boxes taped together (9 in total height) without changing gait speed. No evidence of imbalance.    Gait with Narrow Base of Support Is able to ambulate for 10 steps heel to toe with no staggering.    Gait with Eyes Closed Walks 20 ft, no  assistive devices, good speed, no evidence of imbalance, normal gait pattern, deviates no more than 6 in outside 12 in walkway width. Ambulates 20 ft in less than 7 sec.   6.25s   Ambulating Backwards Walks 20 ft, uses assistive device, slower speed, mild gait deviations, deviates 6-10 in outside 12 in walkway width.    Steps Alternating feet, must use rail.    Total Score 26    FGA comment: low fall risk         Husband reports pt should have scored a 30/30 on FGA as she was moving better on vacation and at home this morning than she is here. Husband suspects the environment plays a role on pt's mobility and pt states she is just tired. Husband showed video of pt roller skating independently a few nights prior and pt  in agreement that she is capable of more than she thinks.   Emphasized how well pt is doing mobility-wise and encouraged her to continue going to gym and returning to activities she enjoyed before her stroke to work on improved confidence and endurance. Pt verbalized understanding.    PATIENT EDUCATION: Education details: Results of goals, how to return to PT in future if mobility needs change  Person educated: Patient and Spouse Education method: Medical illustrator Education comprehension: verbalized understanding  HOME EXERCISE PROGRAM: To be established   GOALS: Goals reviewed with patient? Yes  SHORT TERM GOALS: Target date: 01/26/2024   Pt will be independent with initial HEP for improved strength, balance, transfers and gait.  Baseline: not established on eval   Goal status: MET  2.  Pt will improve 5 x STS to less than or equal to 60 seconds w/no UE support to demonstrate improved functional strength and transfer efficiency.   Baseline: 92.28s; 22.34s no UE support  Goal status: MET  3.  Pt will improve gait velocity to at least 0.5 m/s no AD for improved gait efficiency and independence  Baseline: 0.27 m/s no AD (5/12); 0.53 m/s no AD  (6/2) Goal status: MET   4.  FGA to be assessed and LTG updated  Baseline:  Goal status: MET    LONG TERM GOALS: Target date: 02/09/2024   Pt will be independent with final gym-based HEP for improved strength, balance, transfers and gait.  Baseline:  Goal status: MET  2.  Pt will improve 5 x STS to less than or equal to 12 seconds w/no UE support to demonstrate improved functional strength and transfer efficiency.   Baseline: 92.28s w/BUE support; 22.34s no UE support (6/2); 17.47s no UE support  Goal status: PARTIALLY MET    3.  Pt will improve gait velocity to at least 0.8 m/s for improved gait efficiency and reduced fall risk   Baseline: 0.27 m/s no AD (5/12); 0.9 m/s no AD; 0.98 m/s no AD (6/30) Goal status: MET   4.  Pt will improve FGA to 24/30 for decreased fall risk   Baseline: 15/30; 27/30 (6/30)  Goal status: MET    ASSESSMENT:  CLINICAL IMPRESSION: Emphasis of skilled PT session on LTG assessment and DC from PT. Pt has met 3 of 4 LTGs and partially met 1 of 4. Pt demonstrates improved gait speed without AD and has returned to the gym 4-5x per week. Pt also significantly improved her score on FGA to 27/30, indicative of low fall risk. However, pt oddly ambulated more quickly w/EC compared to EO. Pt reports this is due to her focusing on her environment with her EO. Pt did improve her time on 5x STS but narrowly missed her goal time by a few seconds. Pt overall is independent w/all mobility and is limited by a functional component to mobility. Pt reports she has been experiencing heart palpitations, but none reported during session today. Pt and husband in agreement to DC from PT today as pt is independent with all mobility and has reached her goals.      OBJECTIVE IMPAIRMENTS: Abnormal gait, cardiopulmonary status limiting activity, decreased activity tolerance, decreased coordination, decreased knowledge of use of DME, decreased mobility, difficulty walking,  decreased strength, impaired sensation, impaired tone, impaired UE functional use, improper body mechanics, and pain.   ACTIVITY LIMITATIONS: carrying, lifting, squatting, stairs, bathing, locomotion level, and caring for others  PARTICIPATION LIMITATIONS: meal prep, cleaning, laundry, interpersonal relationship, driving, shopping, community  activity, occupation, and yard work  PERSONAL FACTORS: Fitness, Past/current experiences, and 1-2 comorbidities: Chiari I malformation and  are also affecting patient's functional outcome.   REHAB POTENTIAL: Good  CLINICAL DECISION MAKING: Evolving/moderate complexity  EVALUATION COMPLEXITY: Moderate  PLAN:  PT FREQUENCY: 1-2x/week (due to copay)  PT DURATION: 6 weeks  PLANNED INTERVENTIONS: 02835- PT Re-evaluation, 97750- Physical Performance Testing, 97110-Therapeutic exercises, 97530- Therapeutic activity, 97112- Neuromuscular re-education, 97535- Self Care, 02859- Manual therapy, [phone]- Gait training, [phone]- Orthotic Initial, [phone]- Orthotic/Prosthetic subsequent, [phone]- Electrical stimulation (manual), Patient/Family education, Balance training, Stair training, Dry Needling, Joint mobilization, Spinal mobilization, Vestibular training, and DME instructions    Marlon BRAVO Dalyah Pla, PT, DPT 02/27/2024, 8:39 AM

## 2024-03-05 ENCOUNTER — Ambulatory Visit: Payer: Self-pay | Admitting: Physical Therapy

## 2024-03-07 ENCOUNTER — Ambulatory Visit (INDEPENDENT_AMBULATORY_CARE_PROVIDER_SITE_OTHER): Admitting: Neurology

## 2024-03-07 ENCOUNTER — Encounter: Payer: Self-pay | Admitting: Neurology

## 2024-03-07 VITALS — BP 112/78 | HR 80 | Ht 64.0 in | Wt 170.0 lb

## 2024-03-07 DIAGNOSIS — G44209 Tension-type headache, unspecified, not intractable: Secondary | ICD-10-CM

## 2024-03-07 DIAGNOSIS — R2990 Unspecified symptoms and signs involving the nervous system: Secondary | ICD-10-CM

## 2024-03-07 DIAGNOSIS — Q2112 Patent foramen ovale: Secondary | ICD-10-CM | POA: Diagnosis not present

## 2024-03-07 NOTE — Patient Instructions (Signed)
 I had a long discussion with the patient regarding her stroke like episode, PFO and endovascular closure, Tension headaches,, risk for recurrent stroke/TIAs, personally independently reviewed imaging studies and stroke evaluation results and answered questions.Continue aspirin  81 mg daily and clopidogrel  75 mg daily  for 6 months and then aspirin  alone for secondary stroke prevention and maintain strict control of hypertension with blood pressure goal below 130/90, diabetes with hemoglobin A1c goal below 6.5% and lipids with LDL cholesterol goal below 70 mg/dL. I also advised the patient to eat a healthy diet with plenty of whole grains, cereals, fruits and vegetables, exercise regularly and maintain ideal body weight .  Check follow-up TCD bubble study to look for adequacy of PFO closure..I recommend she discontinue Tylenol  as she is taking too many which may be contributing to analgesic rebound headaches.  I advised her to increase participation in regular activities for stress relaxation like exercise, meditation and yoga.  Doing neck stretching exercises daily.  She may return to work and drive.  Followup in the future with me only as necessary.  Neck Exercises Ask your health care provider which exercises are safe for you. Do exercises exactly as told by your health care provider and adjust them as directed. It is normal to feel mild stretching, pulling, tightness, or discomfort as you do these exercises. Stop right away if you feel sudden pain or your pain gets worse. Do not begin these exercises until told by your health care provider. Neck exercises can be important for many reasons. They can improve strength and maintain flexibility in your neck, which will help your upper back and prevent neck pain. Stretching exercises Rotation neck stretching  Sit in a chair or stand up. Place your feet flat on the floor, shoulder-width apart. Slowly turn your head (rotate) to the right until a slight stretch  is felt. Turn it all the way to the right so you can look over your right shoulder. Do not tilt or tip your head. Hold this position for 10-30 seconds. Slowly turn your head (rotate) to the left until a slight stretch is felt. Turn it all the way to the left so you can look over your left shoulder. Do not tilt or tip your head. Hold this position for 10-30 seconds. Repeat __________ times. Complete this exercise __________ times a day. Neck retraction  Sit in a sturdy chair or stand up. Look straight ahead. Do not bend your neck. Use your fingers to push your chin backward (retraction). Do not bend your neck for this movement. Continue to face straight ahead. If you are doing the exercise properly, you will feel a slight sensation in your throat and a stretch at the back of your neck. Hold the stretch for 1-2 seconds. Repeat __________ times. Complete this exercise __________ times a day. Strengthening exercises Neck press  Lie on your back on a firm bed or on the floor with a pillow under your head. Use your neck muscles to push your head down on the pillow and straighten your spine. Hold the position as well as you can. Keep your head facing up (in a neutral position) and your chin tucked. Slowly count to 5 while holding this position. Repeat __________ times. Complete this exercise __________ times a day. Isometrics These are exercises in which you strengthen the muscles in your neck while keeping your neck still (isometrics). Sit in a supportive chair and place your hand on your forehead. Keep your head and face facing straight ahead.  Do not flex or extend your neck while doing isometrics. Push forward with your head and neck while pushing back with your hand. Hold for 10 seconds. Do the sequence again, this time putting your hand against the back of your head. Use your head and neck to push backward against the hand pressure. Finally, do the same exercise on either side of your head,  pushing sideways against the pressure of your hand. Repeat __________ times. Complete this exercise __________ times a day. Prone head lifts  Lie face-down (prone position), resting on your elbows so that your chest and upper back are raised. Start with your head facing downward, near your chest. Position your chin either on or near your chest. Slowly lift your head upward. Lift until you are looking straight ahead. Then continue lifting your head as far back as you can comfortably stretch. Hold your head up for 5 seconds. Then slowly lower it to your starting position. Repeat __________ times. Complete this exercise __________ times a day. Supine head lifts  Lie on your back (supine position), bending your knees to point to the ceiling and keeping your feet flat on the floor. Lift your head slowly off the floor, raising your chin toward your chest. Hold for 5 seconds. Repeat __________ times. Complete this exercise __________ times a day. Scapular retraction  Stand with your arms at your sides. Look straight ahead. Slowly pull both shoulders (scapulae) backward and downward (retraction) until you feel a stretch between your shoulder blades in your upper back. Hold for 10-30 seconds. Relax and repeat. Repeat __________ times. Complete this exercise __________ times a day. Contact a health care provider if: Your neck pain or discomfort gets worse when you do an exercise. Your neck pain or discomfort does not improve within 2 hours after you exercise. If you have any of these problems, stop exercising right away. Do not do the exercises again unless your health care provider says that you can. Get help right away if: You develop sudden, severe neck pain. If this happens, stop exercising right away. Do not do the exercises again unless your health care provider says that you can. This information is not intended to replace advice given to you by your health care provider. Make sure you  discuss any questions you have with your health care provider. Document Revised: 02/10/2021 Document Reviewed: 02/10/2021 Elsevier Patient Education  2024 [employer]  Stroke Prevention Some medical conditions and behaviors can lead to a higher chance of having a stroke. You can help prevent a stroke by eating healthy, exercising, not smoking, and managing any medical conditions you have. Stroke is a leading cause of functional impairment. Primary prevention is particularly important because a majority of strokes are first-time events. Stroke changes the lives of not only those who experience a stroke but also their family and other caregivers. How can this condition affect me? A stroke is a medical emergency and should be treated right away. A stroke can lead to brain damage and can sometimes be life-threatening. If a person gets medical treatment right away, there is a better chance of surviving and recovering from a stroke. What can increase my risk? The following medical conditions may increase your risk of a stroke: Cardiovascular disease. High blood pressure (hypertension). Diabetes. High cholesterol. Sickle cell disease. Blood clotting disorders (hypercoagulable state). Obesity. Sleep disorders (obstructive sleep apnea). Other risk factors include: Being older than age 35. Having a history of blood clots, stroke, or mini-stroke (transient ischemic attack, TIA).  Genetic factors, such as race, ethnicity, or a family history of stroke. Smoking cigarettes or using other tobacco products. Taking birth control pills, especially if you also use tobacco. Heavy use of alcohol or drugs, especially cocaine and methamphetamine. Physical inactivity. What actions can I take to prevent this? Manage your health conditions High cholesterol levels. Eating a healthy diet is important for preventing high cholesterol. If cholesterol cannot be managed through diet alone, you may need to take  medicines. Take any prescribed medicines to control your cholesterol as told by your health care provider. Hypertension. To reduce your risk of stroke, try to keep your blood pressure below 130/80. Eating a healthy diet and exercising regularly are important for controlling blood pressure. If these steps are not enough to manage your blood pressure, you may need to take medicines. Take any prescribed medicines to control hypertension as told by your health care provider. Ask your health care provider if you should monitor your blood pressure at home. Have your blood pressure checked every year, even if your blood pressure is normal. Blood pressure increases with age and some medical conditions. Diabetes. Eating a healthy diet and exercising regularly are important parts of managing your blood sugar (glucose). If your blood sugar cannot be managed through diet and exercise, you may need to take medicines. Take any prescribed medicines to control your diabetes as told by your health care provider. Get evaluated for obstructive sleep apnea. Talk to your health care provider about getting a sleep evaluation if you snore a lot or have excessive sleepiness. Make sure that any other medical conditions you have, such as atrial fibrillation or atherosclerosis, are managed. Nutrition Follow instructions from your health care provider about what to eat or drink to help manage your health condition. These instructions may include: Reducing your daily calorie intake. Limiting how much salt (sodium) you use to 1,500 milligrams (mg) each day. Using only healthy fats for cooking, such as olive oil, canola oil, or sunflower oil. Eating healthy foods. You can do this by: Choosing foods that are high in fiber, such as whole grains, and fresh fruits and vegetables. Eating at least 5 servings of fruits and vegetables a day. Try to fill one-half of your plate with fruits and vegetables at each meal. Choosing lean  protein foods, such as lean cuts of meat, poultry without skin, fish, tofu, beans, and nuts. Eating low-fat dairy products. Avoiding foods that are high in sodium. This can help lower blood pressure. Avoiding foods that have saturated fat, trans fat, and cholesterol. This can help prevent high cholesterol. Avoiding processed and prepared foods. Counting your daily carbohydrate intake.  Lifestyle If you drink alcohol: Limit how much you have to: 0-1 drink a day for women who are not pregnant. 0-2 drinks a day for men. Know how much alcohol is in your drink. In the [place] one drink equals one 12 oz bottle of beer ( ), one 5 oz glass of wine ( ), or one 1 oz glass of hard liquor (44mL). Do not use any products that contain nicotine or tobacco. These products include cigarettes, chewing tobacco, and vaping devices, such as e-cigarettes. If you need help quitting, ask your health care provider. Avoid secondhand smoke. Do not use drugs. Activity  Try to stay at a healthy weight. Get at least 30 minutes of exercise on most days, such as: Fast walking. Biking. Swimming. Medicines Take over-the-counter and prescription medicines only as told by your health care provider. Aspirin  or blood thinners (antiplatelets  or anticoagulants) may be recommended to reduce your risk of forming blood clots that can lead to stroke. Avoid taking birth control pills. Talk to your health care provider about the risks of taking birth control pills if: You are over 2 years old. You smoke. You get very bad headaches. You have had a blood clot. Where to find more information American Stroke Association: www.strokeassociation.org Get help right away if: You or a loved one has any symptoms of a stroke. BE FAST is an easy way to remember the main warning signs of a stroke: B - Balance. Signs are dizziness, sudden trouble walking, or loss of balance. E - Eyes. Signs are trouble seeing or a sudden change in  vision. F - Face. Signs are sudden weakness or numbness of the face, or the face or eyelid drooping on one side. A - Arms. Signs are weakness or numbness in an arm. This happens suddenly and usually on one side of the body. S - Speech. Signs are sudden trouble speaking, slurred speech, or trouble understanding what people say. T - Time. Time to call emergency services. Write down what time symptoms started. You or a loved one has other signs of a stroke, such as: A sudden, severe headache with no known cause. Nausea or vomiting. Seizure. These symptoms may represent a serious problem that is an emergency. Do not wait to see if the symptoms will go away. Get medical help right away. Call your local emergency services (911 in the [place] Do not drive yourself to the hospital. Summary You can help to prevent a stroke by eating healthy, exercising, not smoking, limiting alcohol intake, and managing any medical conditions you may have. Do not use any products that contain nicotine or tobacco. These include cigarettes, chewing tobacco, and vaping devices, such as e-cigarettes. If you need help quitting, ask your health care provider. Remember BE FAST for warning signs of a stroke. Get help right away if you or a loved one has any of these signs. This information is not intended to replace advice given to you by your health care provider. Make sure you discuss any questions you have with your health care provider. Document Revised: 07/19/2022 Document Reviewed: 07/19/2022 Elsevier Patient Education  2024 [employer].

## 2024-03-07 NOTE — Progress Notes (Signed)
 [facility] [address] [place]. [place] [address] [phone]       OFFICE FOLLOW-UP NOTE  [name] Date of Birth:  [birth year] Medical Record Number:  [id number]   HPI: Ms. Howson is a pleasant 43 year old African-American lady seen today for initial office follow-up visit following hospital admission for stroke in April 2025.  History is obtained from the patient and review of electronic medical records.  I personally reviewed pertinent available imaging films in PACS. She has past medical history of hypertension, hypothyroidism, supraventricular tachycardia.  She presented on 12/22/2023 for evaluation for sudden onset of numbness in the left side of the face and left upper extremity weakness.  She went to urgent care who activated a code stroke.  On arrival she was noted to have tingling on the left side of her face as well as facial asymmetry.  On exam she was found of sensory deficit on the left face arm and leg and facial droop.  After discussion of risk benefits and alternatives she was given IV TNK uneventfully.  She did well and symptoms resolved shortly thereafter.  She was monitored in the ICU as per post TNK protocol.  MRI scan of the brain was negative for acute stroke.  CTh showed no acute abnormalities but incidental 5 mm cerebellar tonsillar ectopia.  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  2D echo showed normal ejection fraction with positive right to left shunt.  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  This was felt to be incidental.  Patient however saw cardiology as outpatient and had endovascular PFO closure by Dr. Ladona on 01/24/2024.  LDL cholesterol was 93 mg percent.  Hemoglobin A1c was 4.9.  Lower extremity venous Dopplers were negative for DVT.  Patient was discharged home on aspirin  and Plavix  for 3 weeks and then aspirin  alone.   She has restarted Plavix  after PFO closure and back on dual antiplatelet therapy now for 6 months.  She states she is doing well.  She is having minor bruising and no bleeding.  She is tolerating Lipitor without side effects.  She has been complaining of new headaches last few months.  She states that headaches quite frequently and neck up to 3 times a week.  She takes Tylenol  which does help but she has been taking it almost every day.  Headaches are bitemporal and at times bifrontal throbbing usually moderate in intensity but occasionally severe.  There is no accompanying nausea or vomiting and there is occasional light central DVT and she feels better if she lies down closes her eyes.  She denies any prior history of migraine headaches.  She has not yet returned back to work.  She has had no recurrent stroke or TIA symptoms. ROS:   14 system review of systems is positive for headache, palpitations, numbness, weakness, speech difficulties all other systems negative  PMH:  Past Medical History:  Diagnosis Date   Asthma    Cerebral aneurysm    Chiari I malformation (HCC)    HLD (hyperlipidemia)    HTN (hypertension)    Hypothyroidism    PE (pulmonary embolism)    SOB (shortness of breath)     Social History:  Social History   Socioeconomic History   Marital status: Married    Spouse name: Not on file   Number of children: Not on file  Years of education: Not on file   Highest education level: Not on file  Occupational History   Not on file  Tobacco Use   Smoking status: Never   Smokeless tobacco: Never  Vaping Use   Vaping status: Never Used  Substance and Sexual Activity   Alcohol use: Not Currently    Comment: socially   Drug use: No   Sexual activity: Not on file  Other Topics Concern   Not on file  Social History Narrative   Not on file   Social Drivers of Health   Financial Resource Strain: Not on file  Food Insecurity: No Food Insecurity (01/05/2024)   Hunger Vital  Sign    Worried About Running Out of Food in the Last Year: Never true    Ran Out of Food in the Last Year: Never true  Transportation Needs: No Transportation Needs (01/05/2024)   PRAPARE - Administrator, Civil Service (Medical): No    Lack of Transportation (Non-Medical): No  Physical Activity: Not on file  Stress: Not on file  Social Connections: Unknown (12/22/2023)   Social Connection and Isolation Panel    Frequency of Communication with Friends and Family: More than three times a week    Frequency of Social Gatherings with Friends and Family: Once a week    Attends Religious Services: Not on Marketing executive or Organizations: Not on file    Attends Banker Meetings: Not on file    Marital Status: Married  Intimate Partner Violence: Not At Risk (01/05/2024)   Humiliation, Afraid, Rape, and Kick questionnaire    Fear of Current or Ex-Partner: No    Emotionally Abused: No    Physically Abused: No    Sexually Abused: No    Medications:   Current Outpatient Medications on File Prior to Visit  Medication Sig Dispense Refill   acetaminophen  (TYLENOL ) 500 MG tablet Take 1,000 mg by mouth every 6 (six) hours as needed for moderate pain (pain score 4-6).     albuterol  (PROAIR  HFA) 108 (90 Base) MCG/ACT inhaler Inhale 2 puffs into the lungs every 4 (four) hours as needed for wheezing or shortness of breath. 1 Inhaler 1   aspirin  EC 81 MG tablet Take 1 tablet (81 mg total) by mouth daily. Swallow whole. 30 tablet 12   atorvastatin  (LIPITOR) 40 MG tablet Take 1 tablet (40 mg total) by mouth daily. 90 tablet 3   clopidogrel  (PLAVIX ) 75 MG tablet Take 1 tablet (75 mg total) by mouth daily. 90 tablet 3   diltiazem  (CARDIZEM  CD) 120 MG 24 hr capsule Take 1 capsule (120 mg total) by mouth daily. 90 capsule 3   ferrous sulfate 325 (65 FE) MG tablet Take 325 mg by mouth daily.     FIBER GUMMIES PO Take 5 g by mouth daily.     levothyroxine  (SYNTHROID ) 75 MCG  tablet Take 75 mcg by mouth every morning.     loratadine (CLARITIN) 10 MG tablet Take 10 mg by mouth daily as needed for allergies.     losartan-hydrochlorothiazide (HYZAAR) 50-12.5 MG tablet Take 1 tablet by mouth daily.     Multiple Vitamin (MULTIVITAMIN) tablet Take 1 tablet by mouth daily.     Probiotic Product (CULTURELLE PRO & PREBIOTIC PO) Take 2 capsules by mouth daily.     zolpidem (AMBIEN CR) 12.5 MG CR tablet Take 12.5 mg by mouth at bedtime as needed for sleep.  No current facility-administered medications on file prior to visit.    Allergies:  No Known Allergies  Physical Exam General: well developed, well nourished, seated, in no evident distress Head: head normocephalic and atraumatic.  Neck: supple with no carotid or supraclavicular bruits Cardiovascular: regular rate and rhythm, no murmurs Musculoskeletal: no deformity tightness of posterior neck muscles. Skin:  no rash/petichiae Vascular:  Normal pulses all extremities Vitals:   03/07/24 0958  BP: 112/78  Pulse: 80   Neurologic Exam Mental Status: Awake and fully alert. Oriented to place and time. Recent and remote memory intact. Attention span, concentration and fund of knowledge appropriate. Mood and affect appropriate.  Cranial Nerves: Fundoscopic exam reveals sharp disc margins. Pupils equal, briskly reactive to light. Extraocular movements full without nystagmus. Visual fields full to confrontation. Hearing intact. Facial sensation intact. Face, tongue, palate moves normally and symmetrically.  Motor: Normal bulk and tone. Normal strength in all tested extremity muscles. Sensory.: intact to touch ,pinprick .position and vibratory sensation.  Coordination: Rapid alternating movements normal in all extremities. Finger-to-nose and heel-to-shin performed accurately bilaterally. Gait and Station: Arises from chair without difficulty. Stance is normal. Gait demonstrates normal stride length and balance . Able to  heel, toe and tandem walk without difficulty.  Reflexes: 1+ and symmetric. Toes downgoing.   NIHSS  0 Modified Rankin  1   ASSESSMENT: 43 year old African-American lady with strokelike episode in April 2025 presenting with headache and palpitation, dysarthria and left face and upper extremity numbness was treated with IV TNK was negative for any.  PFO closed in May 2025.  She is complaining of new headaches which sound like tension headaches     PLAN: I had a long discussion with the patient regarding her stroke like episode, PFO and endovascular closure, Tension headaches,, risk for recurrent stroke/TIAs, personally independently reviewed imaging studies and stroke evaluation results and answered questions.Continue aspirin  81 mg daily and clopidogrel  75 mg daily  for 6 months and then aspirin  alone for secondary stroke prevention and maintain strict control of hypertension with blood pressure goal below 130/90, diabetes with hemoglobin A1c goal below 6.5% and lipids with LDL cholesterol goal below 70 mg/dL. I also advised the patient to eat a healthy diet with plenty of whole grains, cereals, fruits and vegetables, exercise regularly and maintain ideal body weight .  Check follow-up TCD bubble study to look for adequacy of PFO closure..I recommend she discontinue Tylenol  as she is taking too many which may be contributing to analgesic rebound headaches.  I advised her to increase participation in regular activities for stress relaxation like exercise, meditation and yoga.  Doing neck stretching exercises daily.  She may return to work and drive.  Followup in the future with me only as necessary.  Greater than 50% of time during this visit was spent on counseling,explanation of diagnosis of strokelike episode, PFO, tension headaches discussion and planning of further management, discussion with patient and family and coordination of care Eather Popp, MD Note: This document was prepared with  digital dictation and possible smart phrase technology. Any transcriptional errors that result from this process are unintentional

## 2024-03-14 ENCOUNTER — Ambulatory Visit: Payer: Self-pay | Admitting: Cardiology

## 2024-03-14 DIAGNOSIS — R002 Palpitations: Secondary | ICD-10-CM

## 2024-03-14 NOTE — Progress Notes (Signed)
 Her symptoms of palpitations are related to brief episodes of PACs and atrial tachycardia but no atrial fibrillation.  This could be related to the device/ASD closure but will settle down.  I will discuss more on office visit soon Increase diltiazem  CD1 20 mg to twice daily

## 2024-03-16 MED ORDER — DILTIAZEM HCL ER COATED BEADS 120 MG PO CP24: 120.0000 mg | ORAL_CAPSULE | Freq: Two times a day (BID) | ORAL | 1 refills | Status: DC

## 2024-03-29 ENCOUNTER — Other Ambulatory Visit (HOSPITAL_COMMUNITY): Payer: Self-pay | Admitting: Neurology

## 2024-03-29 ENCOUNTER — Ambulatory Visit (HOSPITAL_COMMUNITY)
Admission: RE | Admit: 2024-03-29 | Discharge: 2024-03-29 | Disposition: A | Discharge status: Home / Self Care | Source: Ambulatory Visit | Attending: Neurology | Admitting: Neurology

## 2024-03-29 ENCOUNTER — Encounter (HOSPITAL_COMMUNITY): Payer: Self-pay | Admitting: Neurology

## 2024-03-29 DIAGNOSIS — Q2112 Patent foramen ovale: Secondary | ICD-10-CM | POA: Diagnosis present

## 2024-04-09 ENCOUNTER — Other Ambulatory Visit (HOSPITAL_COMMUNITY)

## 2024-04-11 ENCOUNTER — Ambulatory Visit (HOSPITAL_COMMUNITY)
Admission: RE | Admit: 2024-04-11 | Discharge: 2024-04-11 | Disposition: A | Discharge status: Home / Self Care | Source: Ambulatory Visit | Attending: Neurology | Admitting: Neurology

## 2024-04-11 ENCOUNTER — Encounter (HOSPITAL_COMMUNITY): Payer: Self-pay | Admitting: Neurology

## 2024-04-11 DIAGNOSIS — Z09 Encounter for follow-up examination after completed treatment for conditions other than malignant neoplasm: Secondary | ICD-10-CM | POA: Diagnosis not present

## 2024-04-11 DIAGNOSIS — Q2112 Patent foramen ovale: Secondary | ICD-10-CM | POA: Insufficient documentation

## 2024-04-11 NOTE — Progress Notes (Signed)
 TCD with bubbles has been completed. Preliminary results can be found in CV Proc through chart review.   04/11/24 2:00 PM Cathlyn Collet RVT

## 2024-04-13 ENCOUNTER — Ambulatory Visit: Payer: Self-pay | Admitting: Neurology

## 2024-04-16 ENCOUNTER — Ambulatory Visit: Attending: Cardiology | Admitting: Cardiology

## 2024-04-16 ENCOUNTER — Encounter: Payer: Self-pay | Admitting: Cardiology

## 2024-04-16 VITALS — BP 107/74 | Resp 16 | Ht 64.0 in | Wt 170.8 lb

## 2024-04-16 DIAGNOSIS — Z8774 Personal history of (corrected) congenital malformations of heart and circulatory system: Secondary | ICD-10-CM

## 2024-04-16 DIAGNOSIS — I10 Essential (primary) hypertension: Secondary | ICD-10-CM

## 2024-04-16 DIAGNOSIS — E7800 Pure hypercholesterolemia, unspecified: Secondary | ICD-10-CM

## 2024-04-16 DIAGNOSIS — Q2112 Patent foramen ovale: Secondary | ICD-10-CM | POA: Diagnosis not present

## 2024-04-16 NOTE — Progress Notes (Signed)
 Cardiology Office Note:  .   Date:  04/16/2024  ID:  Kelsey Friedman, DOB 20-Jan-1981, MRN [id number] PCP: Rexanne Ingle, MD  [facility] Providers Cardiologist:  Gordy Bergamo, MD   History of Present Illness: .   Kelsey Friedman is a 43 y.o. African-American female with hypertension presented to the emergency room on 12/22/2023 with chest discomfort (felt funny) followed by headache and left facial droop then then progressed tod left hemiparesis and numbness and dysarthria diagnosed withembolic  stroke clinically although MRI was negative, received TNK, with improvement in deficits.  She was noted to have PFO by echocardiogram on 12/23/2023.  Extended EKG monitoring to document atrial fibrillation.  She underwent PFO closure on 02-06-2024 with cardio from 30 mm septal occluder.  She now presents for follow-up.  She has had near complete recovery and resolution of neurologic deficits and essentially asymptomatic today.  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  Cardiac Studies relevent.    PFO closure Feb 06, 2024 Interventional data:  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 Post-deployment double contrast study revealed NO residual shunting.   Recommendation: Patient will continue with dual antiplatelet therapy with aspirin  and Plavix  for a period of 30 days, THEN ASA FOR ADDITIONAL 5 MONTHS. Will also need endocarditis prophylaxis for repair of 6 months.   TCD Bubble study 04/11/24: A vascular evaluation was performed. The right middle cerebral artery was  studied. An IV was inserted into the patient's right Cephalic. Verbal  informed consent was obtained.    Post-closure PFO. 15 - 20 HITS detected with valsalva maneuver. A small  residual right to left shunt is detected.      Discussed the use of AI scribe software for clinical note transcription with the patient, who gave verbal consent to proceed.  History of Present Illness Kelsey Friedman is a 43 year old female who presents for follow-up after patent foramen ovale (PFO) closure.  She underwent PFO closure on 2024/02/06, with a 30 mm cardioform device due to a large PFO and stroke-like symptoms. A transcranial Doppler bubble study on April 11, 2024, shows minimal residual shunting. She experiences occasional tingling and mild forgetfulness, which are improving.  She takes baby aspirin  daily and has discontinued Plavix . Aspirin  will continue until the end of the year. No recent dental work is noted.  Hypertension is managed with losartan HCT 50/12.5 mg and diltiazem  CD 120 mg daily. No episodes of heart racing are reported.  Cholesterol was previously 103 mg/dL, and she is due for a cholesterol check today. No migraines or episodes of heart racing are noted.   Labs   Lab Results  Component Value Date   CHOL 163 12/23/2023   HDL 45 12/23/2023   LDLCALC 103 (H) 12/23/2023   TRIG 75 12/23/2023   CHOLHDL 3.6 12/23/2023   No results found for: LIPOA  Recent Labs    12/22/23 1050 12/22/23 1054 12/24/23 0713 01/02/24 1233  NA 137 139 137 143  K 3.2* 3.3* 3.5 4.2  CL 105 106 106 106  CO2 20*  --  20* 22  GLUCOSE 96 93 95 79  BUN 10 10 11 11   CREATININE 0.85 0.90 0.76 0.80  CALCIUM  9.2  --  8.9 9.8  GFRNONAA >60  --  >60  --  Lab Results  Component Value Date   ALT 16 12/22/2023   AST 22 12/22/2023   ALKPHOS 52 12/22/2023   BILITOT 1.3 (H) 12/22/2023      Latest Ref Rng & Units 01/02/2024   12:35 PM 12/24/2023    2:02 PM 12/22/2023   10:54 AM  CBC  WBC 3.4 - 10.8 x10E3/uL 7.1  9.7    Hemoglobin 11.1 - 15.9 g/dL 88.8  89.7  88.7   Hematocrit 34.0 - 46.6 % 37.6  33.4  33.0   Platelets 150 - 450 x10E3/uL 543  431     Lab Results  Component Value Date   HGBA1C 4.9 12/22/2023     No results found for: TSH   ROS  Review of Systems  Cardiovascular:  Negative for chest pain, dyspnea on exertion and leg swelling.  Neurological:  Positive for paresthesias (occasional).   Physical Exam:   VS:  BP 107/74 (BP Location: Left Arm, Patient Position: Sitting, Cuff Size: Normal)   Resp 16   Ht 5' 4 (1.626 m)   Wt 170 lb 12.8 oz (77.5 kg)   SpO2 99%   BMI 29.32 kg/m    Wt Readings from Last 3 Encounters:  04/16/24 170 lb 12.8 oz (77.5 kg)  03/07/24 170 lb (77.1 kg)  02/10/24 169 lb 6.4 oz (76.8 kg)    BP Readings from Last 3 Encounters:  04/16/24 107/74  03/07/24 112/78  02/10/24 108/82   Physical Exam Neck:     Vascular: No carotid bruit or JVD.  Cardiovascular:     Rate and Rhythm: Normal rate and regular rhythm.     Pulses: Intact distal pulses.     Heart sounds: Normal heart sounds. No murmur heard.    No gallop.  Pulmonary:     Effort: Pulmonary effort is normal.     Breath sounds: Normal breath sounds.  Abdominal:     General: Bowel sounds are normal.     Palpations: Abdomen is soft.  Musculoskeletal:     Right lower leg: No edema.     Left lower leg: No edema.    EKG:    EKG Interpretation Date/Time:  Monday April 16 2024 09:59:44 EDT Ventricular Rate:  81 PR Interval:  158 QRS Duration:  80 QT Interval:  394 QTC Calculation: 457 R Axis:   10  Text Interpretation: EKG 04/16/2024: Normal sinus rhythm with rate of 81 bpm, low-voltage complexes otherwise normal EKG.  Compared to 01/02/2024, no significant change. Confirmed by Deshon Hsiao, Jagadeesh ([id number]) on 04/16/2024 10:05:35 AM    ASSESSMENT AND PLAN: .      ICD-10-CM   1. PFO (patent foramen ovale)  Q21.12 EKG 12-Lead    2. S/P patent foramen ovale closure  Z87.74     3. Primary hypertension  I10     4. Hypercholesteremia  E78.00 Lipid Profile     Assessment & Plan Patent foramen ovale, status post closure Status post PFO closure on Jan 24, 2024, with a 30 mm cardioform device.  Previously had strong right-to-left shunting and stroke-like symptoms. Recent TCD bubble study on April 11, 2024, shows minimal residual shunting, indicating successful closure. Healing is ongoing, expected to complete in six months. No current neurological deficits, though occasional tingling and mild memory lapses are noted, expected to resolve with time. - Discontinue clopidogrel  (Plavix ) immediately. - Continue aspirin  until the end of December 2025. - Advise antibiotic prophylaxis for dental procedures until the end of December 2025. - TCD bubble study postclosure  reveals trace distal PFO, this should also completely heal as well and close or time.  Unless recurrence of TIA-like symptoms, no further evaluation is indicated.  Hypertension Hypertension is well-controlled with current medication regimen. No symptoms of heart racing. Current medications include losartan HCT 50/12.5 mg and diltiazem  CD 120 mg once daily. Potential for medication adjustment if weight loss occurs. - Continue losartan HCT 50/12.5 mg daily. - Continue diltiazem  CD 120 mg daily.  Hyperlipidemia Cholesterol level previously recorded at 103 mg/dL, with a target of less than 70 mg/dL. Last checked on December 23, 2023. No recent blood work by primary care provider. - Order cholesterol test today. - Coordinate with primary care provider, Dr. Rexanne, to manage prescriptions and follow-up care.   Follow up: PRN  Signed,  Gordy Bergamo, MD, [facility] 04/16/2024, 10:17 AM [facility] [address] [place], [place] [address] Phone: [phone]. Fax:  [phone]

## 2024-04-16 NOTE — Patient Instructions (Signed)
 Medication Instructions:  Your physician recommends that you continue on your current medications as directed. Please refer to the Current Medication list given to you today.  *If you need a refill on your cardiac medications before your next appointment, please call your pharmacy*  Lab Work: Have lab work drawn today in the lab on the first floor If you have labs (blood work) drawn today and your tests are completely normal, you will receive your results only by: MyChart Message (if you have MyChart) OR A paper copy in the mail If you have any lab test that is abnormal or we need to change your treatment, we will call you to review the results.  Testing/Procedures: none  Follow-Up: At [facility], you and your health needs are our priority.  As part of our continuing mission to provide you with exceptional heart care, our providers are all part of one team.  This team includes your primary Cardiologist (physician) and Advanced Practice Providers or APPs (Physician Assistants and Nurse Practitioners) who all work together to provide you with the care you need, when you need it.  Your next appointment:   As needed  Provider:   Gordy Bergamo, MD    We recommend signing up for the patient portal called MyChart.  Sign up information is provided on this After Visit Summary.  MyChart is used to connect with patients for Virtual Visits (Telemedicine).  Patients are able to view lab/test results, encounter notes, upcoming appointments, etc.  Non-urgent messages can be sent to your provider as well.   To learn more about what you can do with MyChart, go to [email or web address].   Other Instructions

## 2024-04-17 ENCOUNTER — Ambulatory Visit: Payer: Self-pay | Admitting: Cardiology

## 2024-04-17 LAB — LIPID PANEL
Chol/HDL Ratio: 2.4 ratio (ref 0.0–4.4)
Cholesterol, Total: 144 mg/dL (ref 100–199)
HDL: 61 mg/dL (ref >39)
LDL Chol Calc (NIH): 61 mg/dL (ref 0–99)
Triglycerides: 126 mg/dL (ref 0–149)
VLDL Cholesterol Cal: 22 mg/dL (ref 5–40)

## 2024-04-17 NOTE — Progress Notes (Signed)
Normal lipids

## 2024-04-21 ENCOUNTER — Encounter: Payer: Self-pay | Admitting: Neurology

## 2024-04-23 ENCOUNTER — Encounter: Payer: Self-pay | Admitting: Neurology

## 2024-05-25 ENCOUNTER — Encounter: Payer: Self-pay | Admitting: Neurology

## 2024-06-18 ENCOUNTER — Telehealth: Payer: Self-pay | Admitting: Cardiology

## 2024-06-18 NOTE — Telephone Encounter (Signed)
 Paper Work Dropped Off:  physician statement of critical illness   Date:  06-18-24  Location of paper:  Dr Godfrey mailbox

## 2024-06-26 ENCOUNTER — Encounter: Payer: Self-pay | Admitting: Cardiology

## 2024-06-27 ENCOUNTER — Emergency Department (HOSPITAL_COMMUNITY): Admission: EM | Admit: 2024-06-27 | Discharge: 2024-06-27 | Disposition: A | Discharge status: Home / Self Care

## 2024-06-27 ENCOUNTER — Encounter (HOSPITAL_COMMUNITY): Payer: Self-pay

## 2024-06-27 ENCOUNTER — Emergency Department (HOSPITAL_COMMUNITY)

## 2024-06-27 ENCOUNTER — Other Ambulatory Visit: Payer: Self-pay

## 2024-06-27 DIAGNOSIS — R519 Headache, unspecified: Secondary | ICD-10-CM | POA: Diagnosis present

## 2024-06-27 DIAGNOSIS — Z79899 Other long term (current) drug therapy: Secondary | ICD-10-CM | POA: Insufficient documentation

## 2024-06-27 DIAGNOSIS — Z7982 Long term (current) use of aspirin: Secondary | ICD-10-CM | POA: Diagnosis not present

## 2024-06-27 DIAGNOSIS — G43109 Migraine with aura, not intractable, without status migrainosus: Secondary | ICD-10-CM | POA: Diagnosis not present

## 2024-06-27 DIAGNOSIS — R531 Weakness: Secondary | ICD-10-CM

## 2024-06-27 DIAGNOSIS — E876 Hypokalemia: Secondary | ICD-10-CM | POA: Insufficient documentation

## 2024-06-27 DIAGNOSIS — Z8673 Personal history of transient ischemic attack (TIA), and cerebral infarction without residual deficits: Secondary | ICD-10-CM | POA: Insufficient documentation

## 2024-06-27 LAB — COMPREHENSIVE METABOLIC PANEL WITH GFR
ALT: 29 U/L (ref 0–44)
AST: 19 U/L (ref 15–41)
Albumin: 3.7 g/dL (ref 3.5–5.0)
Alkaline Phosphatase: 58 U/L (ref 38–126)
Anion gap: 17 — ABNORMAL HIGH (ref 5–15)
BUN: 11 mg/dL (ref 6–20)
CO2: 24 mmol/L (ref 22–32)
Calcium: 8.8 mg/dL — ABNORMAL LOW (ref 8.9–10.3)
Chloride: 97 mmol/L — ABNORMAL LOW (ref 98–111)
Creatinine, Ser: 0.88 mg/dL (ref 0.44–1.00)
GFR, Estimated: >60 mL/min (ref >60)
Glucose, Bld: 100 mg/dL — ABNORMAL HIGH (ref 70–99)
Potassium: 2.9 mmol/L — ABNORMAL LOW (ref 3.5–5.1)
Sodium: 138 mmol/L (ref 135–145)
Total Bilirubin: 1.3 mg/dL — ABNORMAL HIGH (ref 0.0–1.2)
Total Protein: 6.6 g/dL (ref 6.5–8.1)

## 2024-06-27 LAB — CBC
HCT: 42.4 % (ref 36.0–46.0)
Hemoglobin: 14.5 g/dL (ref 12.0–15.0)
MCH: 30.7 pg (ref 26.0–34.0)
MCHC: 34.2 g/dL (ref 30.0–36.0)
MCV: 89.8 fL (ref 80.0–100.0)
Platelets: 355 K/uL (ref 150–400)
RBC: 4.72 MIL/uL (ref 3.87–5.11)
RDW: 12.9 % (ref 11.5–15.5)
WBC: 15.9 K/uL — ABNORMAL HIGH (ref 4.0–10.5)
nRBC: 0 % (ref 0.0–0.2)

## 2024-06-27 LAB — DIFFERENTIAL
Abs Immature Granulocytes: 0.09 K/uL — ABNORMAL HIGH (ref 0.00–0.07)
Basophils Absolute: 0.1 K/uL (ref 0.0–0.1)
Basophils Relative: 1 %
Eosinophils Absolute: 0.1 K/uL (ref 0.0–0.5)
Eosinophils Relative: 1 %
Immature Granulocytes: 1 %
Lymphocytes Relative: 24 %
Lymphs Abs: 3.7 K/uL (ref 0.7–4.0)
Monocytes Absolute: 1.1 K/uL — ABNORMAL HIGH (ref 0.1–1.0)
Monocytes Relative: 7 %
Neutro Abs: 10.8 K/uL — ABNORMAL HIGH (ref 1.7–7.7)
Neutrophils Relative %: 66 %

## 2024-06-27 LAB — I-STAT CHEM 8, ED
BUN: 11 mg/dL (ref 6–20)
Calcium, Ion: 1.2 mmol/L (ref 1.15–1.40)
Chloride: 102 mmol/L (ref 98–111)
Creatinine, Ser: 0.8 mg/dL (ref 0.44–1.00)
Glucose, Bld: 92 mg/dL (ref 70–99)
HCT: 45 % (ref 36.0–46.0)
Hemoglobin: 15.3 g/dL — ABNORMAL HIGH (ref 12.0–15.0)
Potassium: 3 mmol/L — ABNORMAL LOW (ref 3.5–5.1)
Sodium: 140 mmol/L (ref 135–145)
TCO2: 26 mmol/L (ref 22–32)

## 2024-06-27 LAB — PROTIME-INR
INR: 1 (ref 0.8–1.2)
Prothrombin Time: 14.1 s (ref 11.4–15.2)

## 2024-06-27 LAB — URINALYSIS, ROUTINE W REFLEX MICROSCOPIC
Bilirubin Urine: NEGATIVE
Glucose, UA: NEGATIVE mg/dL
Hgb urine dipstick: NEGATIVE
Ketones, ur: NEGATIVE mg/dL
Leukocytes,Ua: NEGATIVE
Nitrite: NEGATIVE
Protein, ur: NEGATIVE mg/dL
Specific Gravity, Urine: 1.003 — ABNORMAL LOW (ref 1.005–1.030)
pH: 6 (ref 5.0–8.0)

## 2024-06-27 LAB — APTT: aPTT: 25 s (ref 24–36)

## 2024-06-27 LAB — ETHANOL: Alcohol, Ethyl (B): <15 mg/dL (ref <15)

## 2024-06-27 LAB — CBG MONITORING, ED: Glucose-Capillary: 105 mg/dL — ABNORMAL HIGH (ref 70–99)

## 2024-06-27 LAB — HCG, SERUM, QUALITATIVE: Preg, Serum: NEGATIVE

## 2024-06-27 MED ORDER — METOCLOPRAMIDE HCL 5 MG/ML IJ SOLN
10.0000 mg | Freq: Once | INTRAMUSCULAR | Status: AC
  Administered 2024-06-27: 10 mg via INTRAVENOUS
  Filled 2024-06-27: qty 2

## 2024-06-27 MED ORDER — SODIUM CHLORIDE 0.9% FLUSH
3.0000 mL | Freq: Once | INTRAVENOUS | Status: AC
  Administered 2024-06-27: 3 mL via INTRAVENOUS

## 2024-06-27 MED ORDER — DIPHENHYDRAMINE HCL 50 MG/ML IJ SOLN
25.0000 mg | Freq: Once | INTRAMUSCULAR | Status: AC
  Administered 2024-06-27: 25 mg via INTRAVENOUS
  Filled 2024-06-27: qty 1

## 2024-06-27 MED ORDER — POTASSIUM CHLORIDE CRYS ER 20 MEQ PO TBCR: 20.0000 meq | EXTENDED_RELEASE_TABLET | Freq: Two times a day (BID) | ORAL | 0 refills | Status: DC

## 2024-06-27 MED ORDER — KETOROLAC TROMETHAMINE 15 MG/ML IJ SOLN
15.0000 mg | Freq: Once | INTRAMUSCULAR | Status: AC
  Administered 2024-06-27: 15 mg via INTRAVENOUS
  Filled 2024-06-27: qty 1

## 2024-06-27 MED ORDER — POTASSIUM CHLORIDE CRYS ER 20 MEQ PO TBCR
40.0000 meq | EXTENDED_RELEASE_TABLET | Freq: Once | ORAL | Status: AC
  Administered 2024-06-27: 40 meq via ORAL
  Filled 2024-06-27: qty 2

## 2024-06-27 NOTE — ED Notes (Signed)
 Pt to MRI

## 2024-06-27 NOTE — Consult Note (Signed)
 Stroke Neurology Consultation Note  Consult Requested by: Dr. Gennaro  Reason for Consult: code stroke  Consult Date: 06/27/24   The history was obtained from the pt.  During history and examination, all items were able to obtain unless otherwise noted.  History of Present Illness:  Kelsey Friedman is a 43 y.o. African American female with PMH of HTN, PFO s/p closure, SVT, HLD, stroke like symptoms in 11/2023 presented to ED for code stroke.  Pt was at work this morning, started to have HA around 7-8 am, with frontal and left temporal HA, burning sensation, brain fogginess. Around 10am, she felt left sided heaviness. She spoke with her manager and her husband was notified and she was sent to ER for evaluation. CT negative for acute finding. She also complained about asthma symptoms since Sunday. Yesterday she called her cardiologist Dr. Ladona office complaining of SOB and heart was beating out of chest.   Pt had stroke like symptoms in 11/2023 with HA, heart palpitation, left sided weakness. S/p TNK and MRI negative for stroke. CTA head and neck only showed 2 mm posteriorly projecting outpouching at junction of A1 and A2 segments of left ACA. EF 60-65%, LDL 103 and A1C 4.9. UDS neg. Found to have PFO. Discharged with DAPT and lipitor 40.   She followed with Dr. Ladona as outpt and had PFO closure in 12/2023 and continued DAPT for 6 months. She also followed with Dr. Rosemarie in 02/2024 and repeated TCD showed small residue PFO left. Pt continued to have frequent HAs, and Dr. Rosemarie considered more tension HA.   LSN: 10am TNK Given: No: nondisabling symptoms and likely not stroke IR Thrombectomy? No, nondisabling symptoms and likely not stroke Modified Rankin Scale: 1-No significant post stroke disability and can perform usual duties with stroke symptoms  Past Medical History:  Diagnosis Date   Asthma    Cerebral aneurysm    Chiari I malformation (HCC)    HLD (hyperlipidemia)    HTN  (hypertension)    Hypothyroidism    PE (pulmonary embolism)    SOB (shortness of breath)     Past Surgical History:  Procedure Laterality Date   CHOLECYSTECTOMY     PATENT FORAMEN OVALE(PFO) CLOSURE N/A 01/24/2024   Procedure: PATENT FORAMEN OVALE(PFO) CLOSURE;  Surgeon: Ladona Heinz, MD;  Location: [facility] INVASIVE CV LAB;  Service: Cardiovascular;  Laterality: N/A;   TONSILLECTOMY      Family History  Problem Relation Age of Onset   Heart attack Father 34   Cancer Sister        hodgkin   Stroke Sister    Diabetes Brother    Emphysema Maternal Uncle    Allergies Daughter    Allergic rhinitis Neg Hx    Angioedema Neg Hx    Asthma Neg Hx    Eczema Neg Hx    Urticaria Neg Hx     Social History:  reports that she has never smoked. She has never used smokeless tobacco. She reports that she does not currently use alcohol. She reports that she does not use drugs.  Allergies: No Known Allergies  No current facility-administered medications on file prior to encounter.   Current Outpatient Medications on File Prior to Encounter  Medication Sig Dispense Refill   acetaminophen  (TYLENOL ) 500 MG tablet Take 1,000 mg by mouth every 6 (six) hours as needed for moderate pain (pain score 4-6).     albuterol  (PROAIR  HFA) 108 (90 Base) MCG/ACT inhaler Inhale 2 puffs into the  lungs every 4 (four) hours as needed for wheezing or shortness of breath. 1 Inhaler 1   aspirin  EC 81 MG tablet Take 1 tablet (81 mg total) by mouth daily. Swallow whole. 30 tablet 12   atorvastatin  (LIPITOR) 40 MG tablet Take 1 tablet (40 mg total) by mouth daily. 90 tablet 3   diltiazem  (CARDIZEM  CD) 120 MG 24 hr capsule Take 1 capsule (120 mg total) by mouth 2 (two) times daily. 180 capsule 1   ferrous sulfate 325 (65 FE) MG tablet Take 325 mg by mouth daily.     FIBER GUMMIES PO Take 5 g by mouth daily.     levothyroxine  (SYNTHROID ) 75 MCG tablet Take 75 mcg by mouth every morning.     loratadine (CLARITIN) 10 MG tablet  Take 10 mg by mouth daily as needed for allergies.     losartan-hydrochlorothiazide (HYZAAR) 50-12.5 MG tablet Take 1 tablet by mouth daily.     Multiple Vitamin (MULTIVITAMIN) tablet Take 1 tablet by mouth daily.     Probiotic Product (CULTURELLE PRO & PREBIOTIC PO) Take 2 capsules by mouth daily.     zolpidem (AMBIEN CR) 12.5 MG CR tablet Take 12.5 mg by mouth at bedtime as needed for sleep.      Review of Systems: A full ROS was attempted today and was able to be performed.  Systems assessed include - Constitutional, Eyes, HENT, Respiratory, Cardiovascular, Gastrointestinal, Genitourinary, Integument/breast, Hematologic/lymphatic, Musculoskeletal, Neurological, Behavioral/Psych, Endocrine, Allergic/Immunologic - with pertinent responses as per HPI.  Physical Examination: Temp:  [98.3 F (36.8 C)] 98.3 F (36.8 C) (10/29 1217) Pulse Rate:  [75] 75 (10/29 1217) Resp:  [16] 16 (10/29 1217) BP: (119)/(78) 119/78 (10/29 1217) SpO2:  [100 %] 100 % (10/29 1217) Weight:  [80.2 kg] 80.2 kg (10/29 1200)  General - well nourished, well developed, anxious and in tears.    Ophthalmologic - fundi not visualized due to noncooperation.    Cardiovascular - regular rhythm and rate  Neuro - awake, alert, depressed mood, eyes open, orientated to age, place, time. No aphasia but slow talking and psychomotor slowing, paucity of speech, following all simple commands. Able to name and repeat. No gaze palsy, tracking bilaterally, visual field full. No facial droop. Tongue midline. Bilateral UEs no drift. RLE no drift. LLE inconsistent with giveaway weakness, with best effort no drift. Sensation symmetrical bilaterally, b/l FTN intact but slow, gait initially had limping on the left and then slowly walking without asymmetry  NIHSS = 1 for dysarthria    Data Reviewed: CT HEAD CODE STROKE WO CONTRAST Result Date: 06/27/2024 EXAM: CT HEAD WITHOUT 06/27/2024 12:02:39 PM TECHNIQUE: CT of the head was performed  without the administration of intravenous contrast. Automated exposure control, iterative reconstruction, and/or weight based adjustment of the mA/kV was utilized to reduce the radiation dose to as low as reasonably achievable. COMPARISON: MRI head 12/23/2023 and CT head 12/22/2023. CLINICAL HISTORY: Neuro deficit, acute, stroke suspected. Headache, head pressure, fogginess, left sided weakness. FINDINGS: BRAIN AND VENTRICLES: No acute intracranial hemorrhage. No mass effect or midline shift. No extra-axial fluid collection. No evidence of acute infarct. No hydrocephalus. 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 ORBITS: No acute abnormality. SINUSES AND MASTOIDS: Secretions within the right sphenoid sinus. There is mild mucosal thickening in the alveolar recesses of the maxillary sinuses. SOFT TISSUES AND SKULL: No acute skull fracture. No acute soft tissue abnormality. [place] stroke program early CT (aspect) score: Ganglionic (  caudate, ic, lentiform nucleus, insula, M1-m3): 7 Supraganglionic (m4-m6): 3 Total: 10 IMPRESSION: 1. No acute intracranial abnormality. 2. ASPECTS 10. 3. 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 No hydrocephalus. 4. Findings messaged to Dr. Jerri in the [facility] messaging system at 12:16PM on 06/27/24. Electronically signed by: Donnice Mania MD 06/27/2024 12:16 PM EDT RP Workstation: [id number]    Assessment: 43 y.o. female with PMH of HTN, PFO s/p closure, SVT, HLD, stroke like symptoms in 11/2023 presented to ED for HA and then left sided heaviness. She also complained about asthma symptoms since Sunday. Yesterday she called her cardiologist Dr. Ladona office complaining of SOB and heart was beating out of chest. CT no acute finding. NIHSS = 1 for dysarthria. Exam also showed inconsistent LLE with intermittent giveaway weakness. Pt had similar episode in 11/2023 s/p TNK  but MRI negative. Pt not TNK candidate this time due to nondisabling symptoms and likely not stroke. Pt symptoms could be complicated migraine. However, will do MRI to rule out stroke given risk factors. Recommend HA cocktail and may consider to start Topamax for HA prevention.     Stroke Risk Factors - hyperlipidemia, hypertension, and PFO s/p closure  Plan: - MRI brain routine - HA cocktail - May consider topamax for HA prevention on discharge - if MRI neg, and pt HA improves, able to ambulate in ER, then she may be discharged with close neuro follow up - she follows with Dr. Rosemarie and Dr. Ladona as outpt - discussed with EDP Dr. Gennaro - will follow  Ary Jerri, MD PhD Stroke Neurology 06/27/2024 12:50 PM   Thank you for this consultation and allowing us  to participate in the care of this patient.

## 2024-06-27 NOTE — ED Notes (Signed)
 Pt ambulated well to and from the RR

## 2024-06-27 NOTE — ED Provider Notes (Signed)
 [place] EMERGENCY DEPARTMENT AT [facility] Provider Note   CSN: [id number] Arrival date & time: 06/27/24  1155     Patient presents with: Weakness   Kelsey Friedman is a 43 y.o. female.   43 year old female presents for evaluation of left-sided heaviness that started around 10 AM today.  She has a history of stroke.  She states she also had a mild headache.  She has a stroke alert prehospital and on arrival to ED neuro is at bedside.  On my exam she states she still has some heaviness where she states she has some trouble with her left side at baseline.  Denies any other symptoms or concerns.   Weakness Associated symptoms: no abdominal pain, no arthralgias, no chest pain, no cough, no dysuria, no fever, no seizures, no shortness of breath and no vomiting        Prior to Admission medications   Medication Sig Start Date End Date Taking? Authorizing Provider  potassium chloride  SA (KLOR-CON  M) 20 MEQ tablet Take 1 tablet (20 mEq total) by mouth 2 (two) times daily for 4 days. 06/27/24 07/01/24 Yes Nyshawn Gowdy L, DO  acetaminophen  (TYLENOL ) 500 MG tablet Take 1,000 mg by mouth every 6 (six) hours as needed for moderate pain (pain score 4-6).    [provider]  albuterol  (PROAIR  HFA) 108 (90 Base) MCG/ACT inhaler Inhale 2 puffs into the lungs every 4 (four) hours as needed for wheezing or shortness of breath. 06/16/17   Cari Arlean HERO, FNP  aspirin  EC 81 MG tablet Take 1 tablet (81 mg total) by mouth daily. Swallow whole. 12/25/23   Judithe Rocky BROCKS, NP  atorvastatin  (LIPITOR) 40 MG tablet Take 1 tablet (40 mg total) by mouth daily. 02/10/24   Rana Lum LITTIE, NP  diltiazem  (CARDIZEM  CD) 120 MG 24 hr capsule Take 1 capsule (120 mg total) by mouth 2 (two) times daily. 03/16/24   Ladona Heinz, MD  ferrous sulfate 325 (65 FE) MG tablet Take 325 mg by mouth daily.    [provider]  FIBER GUMMIES PO Take 5 g by mouth daily.    [provider]   levothyroxine  (SYNTHROID ) 75 MCG tablet Take 75 mcg by mouth every morning. 05/10/22   [provider]  loratadine (CLARITIN) 10 MG tablet Take 10 mg by mouth daily as needed for allergies.    [provider]  losartan-hydrochlorothiazide (HYZAAR) 50-12.5 MG tablet Take 1 tablet by mouth daily. 05/05/22   [provider]  Multiple Vitamin (MULTIVITAMIN) tablet Take 1 tablet by mouth daily.    [provider]  Probiotic Product (CULTURELLE PRO & PREBIOTIC PO) Take 2 capsules by mouth daily.    [provider]  zolpidem (AMBIEN CR) 12.5 MG CR tablet Take 12.5 mg by mouth at bedtime as needed for sleep.    [provider]    Allergies: Patient has no known allergies.    Review of Systems  Constitutional:  Negative for chills and fever.  HENT:  Negative for ear pain and sore throat.   Eyes:  Negative for pain and visual disturbance.  Respiratory:  Negative for cough and shortness of breath.   Cardiovascular:  Negative for chest pain and palpitations.  Gastrointestinal:  Negative for abdominal pain and vomiting.  Genitourinary:  Negative for dysuria and hematuria.  Musculoskeletal:  Negative for arthralgias and back pain.  Skin:  Negative for color change and rash.  Neurological:  Positive for weakness. Negative for seizures  and syncope.  All other systems reviewed and are negative.   Updated Vital Signs BP 101/75   Pulse 75   Temp 98.2 F (36.8 C) (Oral)   Resp 16   Ht 5' 4 (1.626 m)   Wt 80.2 kg   SpO2 100%   BMI 30.35 kg/m   Physical Exam Vitals and nursing note reviewed.  Constitutional:      General: She is not in acute distress.    Appearance: She is well-developed.  HENT:     Head: Normocephalic and atraumatic.  Eyes:     Conjunctiva/sclera: Conjunctivae normal.  Cardiovascular:     Rate and Rhythm: Normal rate and regular rhythm.     Heart sounds: No murmur heard. Pulmonary:     Effort: Pulmonary effort is  normal. No respiratory distress.     Breath sounds: Normal breath sounds.  Abdominal:     Palpations: Abdomen is soft.     Tenderness: There is no abdominal tenderness.  Musculoskeletal:        General: No swelling.     Cervical back: Neck supple.  Skin:    General: Skin is warm and dry.     Capillary Refill: Capillary refill takes less than 2 seconds.  Neurological:     Mental Status: She is alert.     Comments: Patient seems globally weak, somewhat functional symptoms, left-sided leg is slightly more weak than the right, patient with no dysarthria but stutter, muscle strength 4 out of out of 5 in bilateral upper extremities  Psychiatric:        Mood and Affect: Mood normal.     (all labs ordered are listed, but only abnormal results are displayed) Labs Reviewed  CBC - Abnormal; Notable for the following components:      Result Value   WBC 15.9 (*)    All other components within normal limits  DIFFERENTIAL - Abnormal; Notable for the following components:   Neutro Abs 10.8 (*)    Monocytes Absolute 1.1 (*)    Abs Immature Granulocytes 0.09 (*)    All other components within normal limits  COMPREHENSIVE METABOLIC PANEL WITH GFR - Abnormal; Notable for the following components:   Potassium 2.9 (*)    Chloride 97 (*)    Glucose, Bld 100 (*)    Calcium  8.8 (*)    Total Bilirubin 1.3 (*)    Anion gap 17 (*)    All other components within normal limits  URINALYSIS, ROUTINE W REFLEX MICROSCOPIC - Abnormal; Notable for the following components:   Color, Urine STRAW (*)    Specific Gravity, Urine 1.003 (*)    All other components within normal limits  I-STAT CHEM 8, ED - Abnormal; Notable for the following components:   Potassium 3.0 (*)    Hemoglobin 15.3 (*)    All other components within normal limits  CBG MONITORING, ED - Abnormal; Notable for the following components:   Glucose-Capillary 105 (*)    All other components within normal limits  PROTIME-INR  APTT  ETHANOL   HCG, SERUM, QUALITATIVE    EKG: None  Radiology: MR BRAIN WO CONTRAST Result Date: 06/27/2024 EXAM: MRI BRAIN WITHOUT CONTRAST 06/27/2024 03:06:13 PM TECHNIQUE: Multiplanar multisequence MRI of the head/brain was performed without the administration of intravenous contrast. COMPARISON: Same day CT head. MRI head 12/23/2023. CLINICAL HISTORY: Stroke, follow up. FINDINGS: BRAIN AND VENTRICLES: No acute infarct. No intracranial hemorrhage. No mass. No midline shift. No hydrocephalus. 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 Unchanged nonspecific T2 hyperintensity in the left parietal lobe. Normal flow voids. ORBITS: No acute abnormality. SINUSES AND MASTOIDS: No acute abnormality. BONES AND SOFT TISSUES: Normal marrow signal. No acute soft tissue abnormality. IMPRESSION: 1. No acute intracranial abnormality. 2. Unchanged Chiari I. 3. Unchanged nonspecific T2 hyperintensity in the left parietal lobe. Electronically signed by: Gilmore Molt MD 06/27/2024 03:40 PM EDT RP Workstation: [id number]   CT HEAD CODE STROKE WO CONTRAST Result Date: 06/27/2024 EXAM: CT HEAD WITHOUT 06/27/2024 12:02:39 PM TECHNIQUE: CT of the head was performed without the administration of intravenous contrast. Automated exposure control, iterative reconstruction, and/or weight based adjustment of the mA/kV was utilized to reduce the radiation dose to as low as reasonably achievable. COMPARISON: MRI head 12/23/2023 and CT head 12/22/2023. CLINICAL HISTORY: Neuro deficit, acute, stroke suspected. Headache, head pressure, fogginess, left sided weakness. FINDINGS: BRAIN AND VENTRICLES: No acute intracranial hemorrhage. No mass effect or midline shift. No extra-axial fluid collection. No evidence of acute infarct. No hydrocephalus. 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 ORBITS: No acute  abnormality. SINUSES AND MASTOIDS: Secretions within the right sphenoid sinus. There is mild mucosal thickening in the alveolar recesses of the maxillary sinuses. SOFT TISSUES AND SKULL: No acute skull fracture. No acute soft tissue abnormality. [place] stroke program early CT (aspect) score: Ganglionic (caudate, ic, lentiform nucleus, insula, M1-m3): 7 Supraganglionic (m4-m6): 3 Total: 10 IMPRESSION: 1. No acute intracranial abnormality. 2. ASPECTS 10. 3. 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 No hydrocephalus. 4. Findings messaged to Dr. Jerri in the [facility] messaging system at 12:16PM on 06/27/24. Electronically signed by: Donnice Mania MD 06/27/2024 12:16 PM EDT RP Workstation: [id number]     Procedures   Medications Ordered in the ED  sodium chloride  flush (NS) 0.9 % injection 3 mL (3 mLs Intravenous Given 06/27/24 1322)  ketorolac  (TORADOL ) 15 MG/ML injection 15 mg (15 mg Intravenous Given 06/27/24 1322)  metoCLOPramide (REGLAN) injection 10 mg (10 mg Intravenous Given 06/27/24 1322)  diphenhydrAMINE (BENADRYL) injection 25 mg (25 mg Intravenous Given 06/27/24 1322)  potassium chloride  SA (KLOR-CON  M) CR tablet 40 mEq (40 mEq Oral Given 06/27/24 1328)                                    Medical Decision Making Cardiac monitor interpretation: Sinus rhythm, no ectopy  Patient arrived as a stroke alert and she was taken immediately to CT scan for left-sided weakness.  She has a history of stroke in the past.  She is not anticoagulated.  Neuro at bedside on her arrival.  They think this is likely not a stroke and recommended migraine cocktail but ordered an MRI.  On reevaluation patient is feeling better.  She does have some hypokalemia and I did replace her potassium as well as give her a migraine cocktail and fluids.  MRI is negative.  Per neurology recommendations patient is safe to be discharged home.  I advise close follow-up with primary  care and her specialist and otherwise return to the ER for new or worsening symptoms.  She feels comfortable to plan to be discharged home.  Problems Addressed: Hypokalemia: acute illness or injury Nonintractable headache, unspecified chronicity pattern, unspecified headache type: acute illness or injury Weakness: acute illness or injury  Amount and/or Complexity of Data Reviewed  Independent Historian: EMS    Details: EMS helps provide history to nature of patient's symptoms and last known normal which was an hour prior to arrival External Data Reviewed: notes.    Details: Previous hospital visits reviewed and patient had a stroke in April and was admitted to the hospital at that time Labs: ordered. Decision-making details documented in ED Course.    Details: Ordered and reviewed by me and patient has some hypokalemia but labs are otherwise fairly unremarkable Radiology: ordered and independent interpretation performed. Decision-making details documented in ED Course.    Details: Ordered and interpreted by me independently of radiology CT head: Shows no acute intracranial process  MRI: Reviewed by me and shows no evidence of acute stroke but does show evidence of old stroke ECG/medicine tests: ordered and independent interpretation performed. Decision-making details documented in ED Course.    Details: Ordered and interpreted by me in the absence of cardiology and shows sinus rhythm, no STEMI or significant change when compared to prior Discussion of management or test interpretation with external provider(s): Dr. Jerri -allergy -evaluate the patient I spoke with him regarding the patient's case and he recommended outpatient follow-up since her MRI was negative  Risk OTC drugs. Prescription drug management. Drug therapy requiring intensive monitoring for toxicity.     Final diagnoses:  Nonintractable headache, unspecified chronicity pattern, unspecified headache type  Weakness   Hypokalemia    ED Discharge Orders          Ordered    potassium chloride  SA (KLOR-CON  M) 20 MEQ tablet  2 times daily        06/27/24 1557               Jessicaann Overbaugh L, DO 06/27/24 1627

## 2024-06-27 NOTE — ED Triage Notes (Signed)
 Pt to ED via EMS with c/o left-sided heaviness that started around 1000. Pt reports HA that started around 0700 this am. Pt A&Ox4. Pt breathing even and unlabored.

## 2024-06-27 NOTE — Code Documentation (Signed)
 Stroke Response Nurse Documentation Code Documentation  TAMBERLYN MIDGLEY is a 43 y.o. female arriving to [facility]  via [facility] on 06/27/2024 with past medical hx of HTN, PFO, SVT, and Stroke like symptoms in April of this year.. On aspirin  81 mg daily. Code stroke was activated by EMS.   Patient from work where she was LKW at 10:00 and now complaining of Headache, feeling foggy and dysarthria.  Stroke team at the bedside on patient arrival. Labs drawn and patient cleared for CT by EDP. Patient to CT with team. NIHSS 1, see documentation for details and code stroke times. Patient with dysarthria  on exam. The following imaging was completed:  CT Head. Patient is not a candidate for IV Thrombolytic due to TMTT. Patient is not a candidate for IR due to VAN negative exam.   Care Plan:   In window monitoring: q8min NIHSS and VS until 14:30.  Bedside handoff with ED RN Geofm.    Baneza Bartoszek Livengood  Stroke Response RN

## 2024-06-28 ENCOUNTER — Encounter: Payer: Self-pay | Admitting: Physician Assistant

## 2024-06-28 ENCOUNTER — Emergency Department: Admitting: Physician Assistant

## 2024-06-28 VITALS — BP 114/82 | HR 87 | Ht 64.0 in | Wt 174.8 lb

## 2024-06-28 DIAGNOSIS — I639 Cerebral infarction, unspecified: Secondary | ICD-10-CM

## 2024-06-28 DIAGNOSIS — E876 Hypokalemia: Secondary | ICD-10-CM | POA: Diagnosis not present

## 2024-06-28 DIAGNOSIS — R002 Palpitations: Secondary | ICD-10-CM | POA: Diagnosis not present

## 2024-06-28 DIAGNOSIS — Z8774 Personal history of (corrected) congenital malformations of heart and circulatory system: Secondary | ICD-10-CM

## 2024-06-28 DIAGNOSIS — Q2112 Patent foramen ovale: Secondary | ICD-10-CM

## 2024-06-28 DIAGNOSIS — R079 Chest pain, unspecified: Secondary | ICD-10-CM | POA: Diagnosis not present

## 2024-06-28 DIAGNOSIS — I10 Essential (primary) hypertension: Secondary | ICD-10-CM

## 2024-06-28 DIAGNOSIS — R5383 Other fatigue: Secondary | ICD-10-CM | POA: Diagnosis not present

## 2024-06-28 DIAGNOSIS — G935 Compression of brain: Secondary | ICD-10-CM

## 2024-06-28 DIAGNOSIS — J45909 Unspecified asthma, uncomplicated: Secondary | ICD-10-CM

## 2024-06-28 MED ORDER — METOPROLOL TARTRATE 50 MG PO TABS
ORAL_TABLET | ORAL | 0 refills | Status: DC
Start: 2024-06-28 — End: 2024-07-05

## 2024-06-28 MED ORDER — NITROGLYCERIN 0.4 MG SL SUBL: 0.4000 mg | SUBLINGUAL_TABLET | SUBLINGUAL | 3 refills | Status: AC | PRN

## 2024-06-28 NOTE — Patient Instructions (Signed)
 Medication Instructions:  START Nitroglycerin 0.4mg  Take 1 as needed for emergency chest pain. Take first dose for emergency chest pain; WAIT 5 minutes and then take 2nd dose. IF still having pain if still having chest pain CALL 911 wait an additional 5 minutes before taking final dose. Do not take more than 3 doses in a day.  *If you need a refill on your cardiac medications before your next appointment, please call your pharmacy*  Lab Work: Mag, CBC, BMET, Fasting Lipid Panel on 07/06/24  If you have labs (blood work) drawn today and your tests are completely normal, you will receive your results only by: MyChart Message (if you have MyChart) OR A paper copy in the mail If you have any lab test that is abnormal or we need to change your treatment, we will call you to review the results.  Testing/Procedures:   Your cardiac CT will be scheduled at   [facility] [address]  [place], [place] [address]  At the [facility] at [address], please enter the parking lot using the [address] entrance and use the FREE valet service at the patient drop-off area. Enter the building and check-in with registration on the main floor.  Please follow these instructions carefully (unless otherwise directed):  An IV will be required for this test and Nitroglycerin will be given.   On the Night Before the Test: Be sure to Drink plenty of water. Do not consume any caffeinated/decaffeinated beverages or chocolate 12 hours prior to your test. Do not take any antihistamines 12 hours prior to your test.   On the Day of the Test: Drink plenty of water until 1 hour prior to the test. Do not eat any food 1 hour prior to test. You may take your regular medications prior to the test.  Take metoprolol (Lopressor) 50 mg two hours prior to test. If you take Furosemide/Hydrochlorothiazide/Spironolactone/Chlorthalidone, please HOLD on the morning of the  test. FEMALES- please wear underwire-free bra if available, avoid dresses & tight clothing     After the Test: Drink plenty of water. After receiving IV contrast, you may experience a mild flushed feeling. This is normal. On occasion, you may experience a mild rash up to 24 hours after the test. This is not dangerous. If this occurs, you can take Benadryl 25 mg, Zyrtec, Claritin, or Allegra and increase your fluid intake. (Patients taking Tikosyn should avoid Benadryl, and may take Zyrtec, Claritin, or Allegra) If you experience trouble breathing, this can be serious. If it is severe call 911 IMMEDIATELY. If it is mild, please call our office.  We will call to schedule your test 2-4 weeks out understanding that some insurance companies will need an authorization prior to the service being performed.   For more information and frequently asked questions, please visit our website : [email or web address]  For non-scheduling related questions, please contact the cardiac imaging nurse navigator should you have any questions/concerns: Cardiac Imaging Nurse Navigators Direct Office Dial: [phone]   For scheduling needs, including cancellations and rescheduling, please call Brittany, [phone].   Follow-Up: At [facility], you and your health needs are our priority.  As part of our continuing mission to provide you with exceptional heart care, our providers are all part of one team.  This team includes your primary Cardiologist (physician) and Advanced Practice Providers or APPs (Physician Assistants and Nurse Practitioners) who all work together to provide you with the care you need,  when you need it.  Your next appointment:   4 week(s) after your Coronary CTA   Provider:   Jon Hails, PA-C       or Gordy Bergamo, MD We recommend signing up for the patient portal called MyChart.  Sign up information is provided on this After Visit Summary.  MyChart is used to connect  with patients for Virtual Visits (Telemedicine).  Patients are able to view lab/test results, encounter notes, upcoming appointments, etc.  Non-urgent messages can be sent to your provider as well.   To learn more about what you can do with MyChart, go to [email or web address].

## 2024-06-28 NOTE — Progress Notes (Signed)
 Cardiology Office Note:    Date:  06/28/2024   ID:  LYVIA Friedman, DOB 1981/04/27, MRN [id number]  PCP:  Rexanne Ingle, MD   [facility] Providers Cardiologist:  Gordy Bergamo, MD     Referring MD: Rexanne Ingle, MD   Chief Complaint  Patient presents with   Follow-up    Chest pain    History of Present Illness:    Kelsey Friedman is a 43 y.o. female with a hx of HTN, recent CVA 11/2023, PFO on echo 12/23/23, and atrial tachycardia. She underwent PFO closure 01/24/24.  She remains on ASA monotherapy.  Follow-up heart monitor 02/2024 showed brief episodes of PACs and atrial tachycardia but no atrial fibrillation or flutter.  She presents today with heart pounding in the setting of asthma flair. PCP started prednisone  taper. The next day she had brain fog and stuttering prompting ER evaluation yesterday and CODE STROKE. Imaging negative for acute findings.  She now has chest tightness. CP occurred in the ER yesterday, I do not see CE trended. She also complained of headache. WBC 15.9, likely related to prednisone .   She has residual left sided weakness. 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  Does not sound like CP is exertional. She is typically more fatigued than usual since the CVA and PFO closure.   CP occurs 3-4 times per week - described as a tightness, lasts 6-7 minutes, no change with inspiration, no diaphoresis, no N/V.    Past Medical History:  Diagnosis Date   Asthma    Cerebral aneurysm    Chiari I malformation (HCC)    HLD (hyperlipidemia)    HTN (hypertension)    Hypothyroidism    PE (pulmonary embolism)    SOB (shortness of breath)     Past Surgical History:  Procedure Laterality Date   CHOLECYSTECTOMY     PATENT FORAMEN OVALE(PFO) CLOSURE N/A 01/24/2024   Procedure: PATENT FORAMEN OVALE(PFO) CLOSURE;  Surgeon: Bergamo Gordy, MD;  Location: [facility];  Service: Cardiovascular;  Laterality: N/A;   TONSILLECTOMY       Current Medications: Current Meds  Medication Sig   acetaminophen  (TYLENOL ) 500 MG tablet Take 1,000 mg by mouth every 6 (six) hours as needed for moderate pain (pain score 4-6).   albuterol  (PROAIR  HFA) 108 (90 Base) MCG/ACT inhaler Inhale 2 puffs into the lungs every 4 (four) hours as needed for wheezing or shortness of breath.   aspirin  EC 81 MG tablet Take 1 tablet (81 mg total) by mouth daily. Swallow whole.   atorvastatin  (LIPITOR) 40 MG tablet Take 1 tablet (40 mg total) by mouth daily.   diltiazem  (CARDIZEM  CD) 120 MG 24 hr capsule Take 1 capsule (120 mg total) by mouth 2 (two) times daily.   ferrous sulfate 325 (65 FE) MG tablet Take 325 mg by mouth daily.   FIBER GUMMIES PO Take 5 g by mouth daily.   levothyroxine  (SYNTHROID ) 75 MCG tablet Take 75 mcg by mouth every morning.   loratadine (CLARITIN) 10 MG tablet Take 10 mg by mouth daily as needed for allergies.   losartan-hydrochlorothiazide (HYZAAR) 50-12.5 MG tablet Take 1 tablet by mouth daily.   Magnesium 200 MG CHEW Chew 2 tablets by mouth daily.   metoprolol tartrate (LOPRESSOR) 50 MG tablet Take one (1) 50 mg tablet once, two (2) hours before your CT scan.   Multiple Vitamin (MULTIVITAMIN) tablet Take 1 tablet by mouth daily.   nitroGLYCERIN (NITROSTAT) 0.4 MG  SL tablet Place 1 tablet (0.4 mg total) under the tongue every 5 (five) minutes as needed for chest pain.   potassium chloride  SA (KLOR-CON  M) 20 MEQ tablet Take 1 tablet (20 mEq total) by mouth 2 (two) times daily for 4 days.   Probiotic Product (CULTURELLE PRO & PREBIOTIC PO) Take 2 capsules by mouth daily.   zolpidem (AMBIEN CR) 12.5 MG CR tablet Take 12.5 mg by mouth at bedtime as needed for sleep.   [DISCONTINUED] Magnesium 400 MG CAPS Take 2 tablets by mouth daily.     Allergies:   Patient has no known allergies.   Social History   Socioeconomic History   Marital status: Married    Spouse name: Not on file   Number of children: Not on file   Years of  education: Not on file   Highest education level: Not on file  Occupational History   Not on file  Tobacco Use   Smoking status: Never   Smokeless tobacco: Never  Vaping Use   Vaping status: Never Used  Substance and Sexual Activity   Alcohol use: Not Currently    Comment: socially   Drug use: No   Sexual activity: Not on file  Other Topics Concern   Not on file  Social History Narrative   Not on file   Social Drivers of Health   Financial Resource Strain: Not on file  Food Insecurity: No Food Insecurity (01/05/2024)   Hunger Vital Sign    Worried About Running Out of Food in the Last Year: Never true    Ran Out of Food in the Last Year: Never true  Transportation Needs: No Transportation Needs (01/05/2024)   PRAPARE - Administrator, Civil Service (Medical): No    Lack of Transportation (Non-Medical): No  Physical Activity: Not on file  Stress: Not on file  Social Connections: Unknown (12/22/2023)   Social Connection and Isolation Panel    Frequency of Communication with Friends and Family: More than three times a week    Frequency of Social Gatherings with Friends and Family: Once a week    Attends Religious Services: Not on Marketing Executive or Organizations: Not on file    Attends Banker Meetings: Not on file    Marital Status: Married     Family History: The patient's family history includes Allergies in her daughter; Cancer in her sister; Diabetes in her brother; Emphysema in her maternal uncle; Heart attack (age of onset: 26) in her father; Stroke in her sister. There is no history of Allergic rhinitis, Angioedema, Asthma, Eczema, or Urticaria.  ROS:   Please see the history of present illness.     All other systems reviewed and are negative.  EKGs/Labs/Other Studies Reviewed:    The following studies were reviewed today:  EKG Interpretation Date/Time:  Thursday June 28 2024 08:30:45 EDT Ventricular Rate:  87 PR  Interval:  154 QRS Duration:  66 QT Interval:  368 QTC Calculation: 442 R Axis:   50  Text Interpretation: Normal sinus rhythm Low voltage QRS When compared with ECG of 16-Apr-2024 09:59, No significant change was found Confirmed by Madie Slough ([id number]) on 06/28/2024 8:38:29 AM    Recent Labs: 12/22/2023: Magnesium 1.9 06/27/2024: ALT 29; BUN 11; Creatinine, Ser 0.88; Hemoglobin 14.5; Platelets 355; Potassium 2.9; Sodium 138  Recent Lipid Panel    Component Value Date/Time   CHOL 144 04/16/2024 1111   TRIG 126 04/16/2024 1111  HDL 61 04/16/2024 1111   CHOLHDL 2.4 04/16/2024 1111   CHOLHDL 3.6 12/23/2023 0624   VLDL 15 12/23/2023 0624   LDLCALC 61 04/16/2024 1111     Risk Assessment/Calculations:               Physical Exam:    VS:  BP 114/82 (BP Location: Left Arm)   Pulse 87   Ht 5' 4 (1.626 m)   Wt 174 lb 12.8 oz (79.3 kg)   SpO2 95%   BMI 30.00 kg/m     Wt Readings from Last 3 Encounters:  06/28/24 174 lb 12.8 oz (79.3 kg)  06/27/24 176 lb 12.9 oz (80.2 kg)  04/16/24 170 lb 12.8 oz (77.5 kg)     GEN:  Well nourished, well developed in no acute distress HEENT: Normal NECK: No JVD; No carotid bruits LYMPHATICS: No lymphadenopathy CARDIAC: RRR, no murmurs, rubs, gallops RESPIRATORY:  Clear to auscultation without rales, wheezing or rhonchi  ABDOMEN: Soft, non-tender, non-distended MUSCULOSKELETAL:  No edema; No deformity  SKIN: Warm and dry NEUROLOGIC:  Alert and oriented x 3 PSYCHIATRIC:  Normal affect   ASSESSMENT:    1. Palpitations   2. Chest pain of uncertain etiology   3. Other fatigue   4. Hypokalemia   5. PFO (patent foramen ovale)   6. S/P patent foramen ovale closure   7. Primary hypertension   8. Cerebrovascular accident (CVA), unspecified mechanism (HCC)   9. Chiari I malformation (HCC)   10. Chest pain, unspecified type   11. Asthma, unspecified asthma severity, unspecified whether complicated, unspecified whether persistent     PLAN:    In order of problems listed above:  Chest tightness Increased fatigue - no cardiac enzymes during ER visit yesterday - she reports intermittent chest tightness, unclear etiology - will obtain CT coronary with 50 mg lopressor - will given PRN NTG   Hypokalemia - she is taking 4 days of potassium supplements, I will recheck BMP on 11/7 after being finished with potassium supplement, will also check a Mg   PFO s/p closure 01/24/2024 -On aspirin  monotherapy, needs antibiotic PPx for dental procedures until end of December 2025   Atrial tachycardia - she is on 120 mg cardizem  BID - HR controlled today - may need to reduce BP medications to add additional BB   Hypertension - continue cardizem  120 mg BID and losartan-HCTZ - well controlled   History of CVA - residual left sided weakness - remains fatigued   Asthma - finished recent prednisone  taper - has inhalers - using rescue inhaler several times daily - she is a nonsmoker - will refer to pulmonology     Follow up in 1 month after labs and CT coronary.        Medication Adjustments/Labs and Tests Ordered: Current medicines are reviewed at length with the patient today.  Concerns regarding medicines are outlined above.  Orders Placed This Encounter  Procedures   CT CORONARY MORPH W/CTA COR W/SCORE W/CA W/CM &/OR WO/CM   Magnesium   Basic Metabolic Panel (BMET)   CBC   Lipid panel   Ambulatory referral to Pulmonology   EKG 12-Lead   Meds ordered this encounter  Medications   nitroGLYCERIN (NITROSTAT) 0.4 MG SL tablet    Sig: Place 1 tablet (0.4 mg total) under the tongue every 5 (five) minutes as needed for chest pain.    Dispense:  25 tablet    Refill:  3   metoprolol tartrate (LOPRESSOR) 50 MG tablet  Sig: Take one (1) 50 mg tablet once, two (2) hours before your CT scan.    Dispense:  1 tablet    Refill:  0    Patient Instructions  Medication Instructions:  START Nitroglycerin 0.4mg   Take 1 as needed for emergency chest pain. Take first dose for emergency chest pain; WAIT 5 minutes and then take 2nd dose. IF still having pain if still having chest pain CALL 911 wait an additional 5 minutes before taking final dose. Do not take more than 3 doses in a day.  *If you need a refill on your cardiac medications before your next appointment, please call your pharmacy*  Lab Work: Mag, CBC, BMET, Fasting Lipid Panel on 07/06/24  If you have labs (blood work) drawn today and your tests are completely normal, you will receive your results only by: MyChart Message (if you have MyChart) OR A paper copy in the mail If you have any lab test that is abnormal or we need to change your treatment, we will call you to review the results.  Testing/Procedures:   Your cardiac CT will be scheduled at   [facility] [address]  [place], [place] [address]  At the [facility] at [address], please enter the parking lot using the [address] entrance and use the FREE valet service at the patient drop-off area. Enter the building and check-in with registration on the main floor.  Please follow these instructions carefully (unless otherwise directed):  An IV will be required for this test and Nitroglycerin will be given.   On the Night Before the Test: Be sure to Drink plenty of water. Do not consume any caffeinated/decaffeinated beverages or chocolate 12 hours prior to your test. Do not take any antihistamines 12 hours prior to your test.   On the Day of the Test: Drink plenty of water until 1 hour prior to the test. Do not eat any food 1 hour prior to test. You may take your regular medications prior to the test.  Take metoprolol (Lopressor) 50 mg two hours prior to test. If you take Furosemide/Hydrochlorothiazide/Spironolactone/Chlorthalidone, please HOLD on the morning of the test. FEMALES- please wear underwire-free bra if  available, avoid dresses & tight clothing     After the Test: Drink plenty of water. After receiving IV contrast, you may experience a mild flushed feeling. This is normal. On occasion, you may experience a mild rash up to 24 hours after the test. This is not dangerous. If this occurs, you can take Benadryl 25 mg, Zyrtec, Claritin, or Allegra and increase your fluid intake. (Patients taking Tikosyn should avoid Benadryl, and may take Zyrtec, Claritin, or Allegra) If you experience trouble breathing, this can be serious. If it is severe call 911 IMMEDIATELY. If it is mild, please call our office.  We will call to schedule your test 2-4 weeks out understanding that some insurance companies will need an authorization prior to the service being performed.   For more information and frequently asked questions, please visit our website : [email or web address]  For non-scheduling related questions, please contact the cardiac imaging nurse navigator should you have any questions/concerns: Cardiac Imaging Nurse Navigators Direct Office Dial: [phone]   For scheduling needs, including cancellations and rescheduling, please call Brittany, [phone].   Follow-Up: At [facility], you and your health needs are our priority.  As part of our continuing mission to provide you with exceptional heart care, our providers are all  part of one team.  This team includes your primary Cardiologist (physician) and Advanced Practice Providers or APPs (Physician Assistants and Nurse Practitioners) who all work together to provide you with the care you need, when you need it.  Your next appointment:   4 week(s) after your Coronary CTA   Provider:   Jon Hails, PA-C       or Gordy Bergamo, MD We recommend signing up for the patient portal called MyChart.  Sign up information is provided on this After Visit Summary.  MyChart is used to connect with patients for Virtual Visits (Telemedicine).   Patients are able to view lab/test results, encounter notes, upcoming appointments, etc.  Non-urgent messages can be sent to your provider as well.   To learn more about what you can do with MyChart, go to [email or web address].             Signed, Jon Nat Hails, PA  06/28/2024 9:24 AM    [facility]

## 2024-07-05 ENCOUNTER — Telehealth: Payer: Self-pay

## 2024-07-05 DIAGNOSIS — R072 Precordial pain: Secondary | ICD-10-CM

## 2024-07-05 NOTE — Telephone Encounter (Signed)
 Call has been addressed. Spoke with pt.

## 2024-07-05 NOTE — Telephone Encounter (Signed)
 Pt returned call and is aware that the cardiac ct was cancelled per Jon Hails PA. Pt is aware that a cardiac pet stress test will be ordered. If this testing will show artifact due to PFO closure, another test will be ordered in its place.

## 2024-07-05 NOTE — Addendum Note (Signed)
 Addended by: Murphy Duzan on: 07/05/2024 05:00 PM   Modules accepted: Orders

## 2024-07-05 NOTE — Telephone Encounter (Signed)
 Lmom to discuss Cardiac CT needing to be canceled per Jon Hails PA. Procedure was canceled today due to the PFO closure will cause artifacts on the Cardiac CT. Waiting on a return call.

## 2024-07-06 NOTE — Telephone Encounter (Signed)
 Spoke with pt this morning. Jon Hails PA wants to  proceed with Cardiac PET Stress test as discussed yesterday. Order was placed yesterday and pt is aware that someone will contact her to schedule the procedure. Instructions to prepare for cardiac testing was sent to mychart. Pt voiced understanding.

## 2024-07-10 ENCOUNTER — Ambulatory Visit (HOSPITAL_COMMUNITY)

## 2024-07-18 NOTE — Progress Notes (Signed)
 Cardiology Office Note:    Date:  07/24/2024   ID:  Kelsey Friedman, DOB 29-Nov-1980, MRN [id number]  PCP:  Rexanne Ingle, MD   [facility] Providers Cardiologist:  Gordy Bergamo, MD Cardiology APP:  Madie Jon Garre, PA     Referring MD: Rexanne Ingle, MD   Chief Complaint  Patient presents with   Follow-up    Chest pain concerning for unstable angina    History of Present Illness:    Kelsey Friedman is a 43 y.o. female with a hx of HTN, recent CVA 11/2023, PFO on echo 12/23/23, and atrial tachycardia. She underwent PFO closure 01/24/24.  She remains on ASA monotherapy.  Follow-up heart monitor 02/2024 showed brief episodes of PACs and atrial tachycardia but no atrial fibrillation or flutter.   I saw her in clinic 05/2024 for heart pounding in the setting of asthma flair. PCP started prednisone  taper. The next day she had brain fog and stuttering prompting ER evaluation 06/27/24 and CODE STROKE. Imaging negative for acute findings.  She now has chest tightness. CP occurred in the ER, I do not see CE trended. She also complained of headache. WBC 15.9, likely related to prednisone .    She has residual left sided weakness. 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  Does not sound like CP is exertional. She is typically more fatigued than usual since the CVA and PFO closure.    To evaluate CP, I entertained CT coronary, but may be limited by artifact from PFO closure. PET stress test was ordered.   She presents today for follow up, has not had PET stress yet. She has been having frequent, almost daily, chest pain at rest relieved with NTG. She has 8 NTG tabs left. In less than 1 month she has taken 17 NTG tablets. She tries to walk, but had to stop due to chest pain. She also reports extreme fatigue with work that has been occurring after the last month. She has had at least 2 episodes in which she couldn't sleep due to chest pain, relieved with NTG. She continues  to use her inhaler, but this has not helped chest pain. CP not changed with deep inspiration or position. She states she thought she was improving since her stroke, but over the last month she feels much worse.     Past Medical History:  Diagnosis Date   Asthma    Cerebral aneurysm    Chiari I malformation (HCC)    HLD (hyperlipidemia)    HTN (hypertension)    Hypothyroidism    PE (pulmonary embolism)    SOB (shortness of breath)     Past Surgical History:  Procedure Laterality Date   CHOLECYSTECTOMY     PATENT FORAMEN OVALE(PFO) CLOSURE N/A 01/24/2024   Procedure: PATENT FORAMEN OVALE(PFO) CLOSURE;  Surgeon: Bergamo Gordy, MD;  Location: [facility];  Service: Cardiovascular;  Laterality: N/A;   TONSILLECTOMY      Current Medications: Current Meds  Medication Sig   acetaminophen  (TYLENOL ) 500 MG tablet Take 1,000 mg by mouth every 6 (six) hours as needed for moderate pain (pain score 4-6).   albuterol  (PROAIR  HFA) 108 (90 Base) MCG/ACT inhaler Inhale 2 puffs into the lungs every 4 (four) hours as needed for wheezing or shortness of breath.   aspirin  EC 81 MG tablet Take 1 tablet (81 mg total) by mouth daily. Swallow whole.   atorvastatin  (LIPITOR) 40 MG tablet Take 1 tablet (40 mg total)  by mouth daily.   diltiazem  (CARDIZEM  CD) 120 MG 24 hr capsule Take 1 capsule (120 mg total) by mouth 2 (two) times daily.   ferrous sulfate 325 (65 FE) MG tablet Take 325 mg by mouth daily.   FIBER GUMMIES PO Take 5 g by mouth daily.   levothyroxine  (SYNTHROID ) 75 MCG tablet Take 75 mcg by mouth every morning.   loratadine (CLARITIN) 10 MG tablet Take 10 mg by mouth daily as needed for allergies.   losartan-hydrochlorothiazide (HYZAAR) 50-12.5 MG tablet Take 1 tablet by mouth daily.   Magnesium 200 MG CHEW Chew 2 tablets by mouth daily.   Multiple Vitamin (MULTIVITAMIN) tablet Take 1 tablet by mouth daily.   nitroGLYCERIN  (NITROSTAT ) 0.4 MG SL tablet Place 1 tablet (0.4 mg total) under  the tongue every 5 (five) minutes as needed for chest pain.   Probiotic Product (CULTURELLE PRO & PREBIOTIC PO) Take 2 capsules by mouth daily. (Patient taking differently: Take 2 capsules by mouth daily as needed.)   zolpidem (AMBIEN CR) 12.5 MG CR tablet Take 12.5 mg by mouth at bedtime as needed for sleep.     Allergies:   Patient has no known allergies.   Social History   Socioeconomic History   Marital status: Married    Spouse name: Not on file   Number of children: Not on file   Years of education: Not on file   Highest education level: Not on file  Occupational History   Not on file  Tobacco Use   Smoking status: Never   Smokeless tobacco: Never  Vaping Use   Vaping status: Never Used  Substance and Sexual Activity   Alcohol use: Not Currently    Comment: socially   Drug use: No   Sexual activity: Not on file  Other Topics Concern   Not on file  Social History Narrative   Not on file   Social Drivers of Health   Financial Resource Strain: Not on file  Food Insecurity: No Food Insecurity (01/05/2024)   Hunger Vital Sign    Worried About Running Out of Food in the Last Year: Never true    Ran Out of Food in the Last Year: Never true  Transportation Needs: No Transportation Needs (01/05/2024)   PRAPARE - Administrator, Civil Service (Medical): No    Lack of Transportation (Non-Medical): No  Physical Activity: Not on file  Stress: Not on file  Social Connections: Unknown (12/22/2023)   Social Connection and Isolation Panel    Frequency of Communication with Friends and Family: More than three times a week    Frequency of Social Gatherings with Friends and Family: Once a week    Attends Religious Services: Not on Marketing Executive or Organizations: Not on file    Attends Banker Meetings: Not on file    Marital Status: Married     Family History: The patient's family history includes Allergies in her daughter; Cancer in her  sister; Diabetes in her brother; Emphysema in her maternal uncle; Heart attack (age of onset: 35) in her father; Stroke in her sister. There is no history of Allergic rhinitis, Angioedema, Asthma, Eczema, or Urticaria.  ROS:   Please see the history of present illness.     All other systems reviewed and are negative.  EKGs/Labs/Other Studies Reviewed:    The following studies were reviewed today:  EKG Interpretation Date/Time:  Tuesday July 24 2024 08:10:29 EST Ventricular Rate:  76 PR Interval:  160 QRS Duration:  76 QT Interval:  404 QTC Calculation: 454 R Axis:   39  Text Interpretation: Normal sinus rhythm Low voltage QRS When compared with ECG of 28-Jun-2024 08:30, No significant change was found Confirmed by Madie Slough ([id number]) on 07/24/2024 8:14:47 AM    Recent Labs: 12/22/2023: Magnesium 1.9 06/27/2024: ALT 29; BUN 11; Creatinine, Ser 0.88; Hemoglobin 14.5; Platelets 355; Potassium 2.9; Sodium 138  Recent Lipid Panel    Component Value Date/Time   CHOL 144 04/16/2024 1111   TRIG 126 04/16/2024 1111   HDL 61 04/16/2024 1111   CHOLHDL 2.4 04/16/2024 1111   CHOLHDL 3.6 12/23/2023 0624   VLDL 15 12/23/2023 0624   LDLCALC 61 04/16/2024 1111     Risk Assessment/Calculations:                Physical Exam:    VS:  BP 110/80 (BP Location: Left Arm, Patient Position: Sitting, Cuff Size: Normal)   Pulse 74   Ht 5' 4 (1.626 m)   Wt 173 lb (78.5 kg)   BMI 29.70 kg/m     Wt Readings from Last 3 Encounters:  07/24/24 173 lb (78.5 kg)  06/28/24 174 lb 12.8 oz (79.3 kg)  06/27/24 176 lb 12.9 oz (80.2 kg)     GEN:  Well nourished, well developed in no acute distress HEENT: Normal NECK: No JVD; No carotid bruits LYMPHATICS: No lymphadenopathy CARDIAC: RRR, no murmurs, rubs, gallops RESPIRATORY:  Clear to auscultation without rales, wheezing or rhonchi  ABDOMEN: Soft, non-tender, non-distended MUSCULOSKELETAL:  No edema; No deformity  SKIN: Warm and  dry NEUROLOGIC:  Alert and oriented x 3 PSYCHIATRIC:  Normal affect   ASSESSMENT:    1. Chest pain of uncertain etiology   2. Hypokalemia   3. Stroke determined by clinical assessment (HCC)   4. S/P patent foramen ovale closure   5. PFO (patent foramen ovale)   6. Mild persistent asthma without complication   7. Essential hypertension    PLAN:    In order of problems listed above:  Chest tightness More fatigue since PFO closure - she initially was recovering from stroke, but over the last month has developed chest pain concerning for unstable angina. Chest pain occurring nearly daily and has been relieved by NTG. She does not smoke cigarettes, has asthma moderately controlled with PRN albuterol , awaiting pulmonology appt. She has a strong family history of CAD.  I am concerned about unstable angina. Other possibilities include microvascular disease vs vasospasm vs chest pain related to asthma. She states her inhaler had not improved CP like the NTG tablets.    Hypokalemia - has not had repeat labs since supplementation - needs BMP today   PFO s/p closure 01/24/24 - ASA monotherapy - needs SBE PPX until end of Dec 2025   Atrial tachycardia - on 120 mg cardizem    Hypertension - continue 120 mg cardizem  and losartan-hydrochlorothiazide    Hx of CVA - residual left sided weakness, remains fatigued   Asthma - I referred to pulmonology for frequent use of rescue inhaler   Given ongoing chest pain relieved with NTG nearly daily, I think we should consider direct admission vs outpatient heart catheterization today. EKG does not appear ischemic, she is currently not having chest pain. Due to bed availability, she will proceed to [facility] short stay and await outpatient heart cath. She may require femoral stick given radial pulse, will defer to Dr. Ladona.  Informed Consent   Shared Decision Making/Informed Consent The risks [stroke (1 in 1000), death (1 in 1000), kidney  failure [usually temporary] (1 in 500), bleeding (1 in 200), allergic reaction [possibly serious] (1 in 200)], benefits (diagnostic support and management of coronary artery disease) and alternatives of a cardiac catheterization were discussed in detail with Ms. Machnik and she is willing to proceed.       Medication Adjustments/Labs and Tests Ordered: Current medicines are reviewed at length with the patient today.  Concerns regarding medicines are outlined above.  Orders Placed This Encounter  Procedures   EKG 12-Lead   No orders of the defined types were placed in this encounter.   Patient Instructions  Medication Instructions:  Your physician recommends that you continue on your current medications as directed. Please refer to the Current Medication list given to you today.  *If you need a refill on your cardiac medications before your next appointment, please call your pharmacy*  Lab Work: NONE ordered at this time of appointment   Testing/Procedures: NONE ordered at this time of appointment   Follow-Up: At [facility], you and your health needs are our priority.  As part of our continuing mission to provide you with exceptional heart care, our providers are all part of one team.  This team includes your primary Cardiologist (physician) and Advanced Practice Providers or APPs (Physician Assistants and Nurse Practitioners) who all work together to provide you with the care you need, when you need it.  Your next appointment:    2 weeks post CATH   Provider:   Jon Hails, PA-C          We recommend signing up for the patient portal called MyChart.  Sign up information is provided on this After Visit Summary.  MyChart is used to connect with patients for Virtual Visits (Telemedicine).  Patients are able to view lab/test results, encounter notes, upcoming appointments, etc.  Non-urgent messages can be sent to your provider as well.   To learn more about what you can do  with MyChart, go to [email or web address].   Other Instructions Proceed to the [facility] as directed           Signed, Jon Nat Hails, [place]  07/24/2024 9:36 AM    [facility]

## 2024-07-24 ENCOUNTER — Ambulatory Visit: Attending: Physician Assistant | Admitting: Physician Assistant

## 2024-07-24 ENCOUNTER — Ambulatory Visit (HOSPITAL_COMMUNITY)
Admission: AD | Admit: 2024-07-24 | Discharge: 2024-07-24 | Disposition: A | Discharge status: Home / Self Care | Source: Ambulatory Visit | Attending: Cardiology | Admitting: Cardiology

## 2024-07-24 ENCOUNTER — Encounter (HOSPITAL_COMMUNITY)
Admission: AD | Disposition: A | Discharge status: Home / Self Care | Payer: Self-pay | Source: Ambulatory Visit | Attending: Cardiology

## 2024-07-24 ENCOUNTER — Other Ambulatory Visit: Payer: Self-pay

## 2024-07-24 ENCOUNTER — Encounter: Payer: Self-pay | Admitting: Physician Assistant

## 2024-07-24 VITALS — BP 110/80 | HR 74 | Ht 64.0 in | Wt 173.0 lb

## 2024-07-24 DIAGNOSIS — Q2112 Patent foramen ovale: Secondary | ICD-10-CM

## 2024-07-24 DIAGNOSIS — R079 Chest pain, unspecified: Secondary | ICD-10-CM | POA: Diagnosis not present

## 2024-07-24 DIAGNOSIS — I10 Essential (primary) hypertension: Secondary | ICD-10-CM

## 2024-07-24 DIAGNOSIS — Z7982 Long term (current) use of aspirin: Secondary | ICD-10-CM | POA: Insufficient documentation

## 2024-07-24 DIAGNOSIS — J4489 Other specified chronic obstructive pulmonary disease: Secondary | ICD-10-CM | POA: Insufficient documentation

## 2024-07-24 DIAGNOSIS — E876 Hypokalemia: Secondary | ICD-10-CM | POA: Diagnosis not present

## 2024-07-24 DIAGNOSIS — I200 Unstable angina: Secondary | ICD-10-CM | POA: Diagnosis present

## 2024-07-24 DIAGNOSIS — J4530 Mild persistent asthma, uncomplicated: Secondary | ICD-10-CM

## 2024-07-24 DIAGNOSIS — Z8249 Family history of ischemic heart disease and other diseases of the circulatory system: Secondary | ICD-10-CM | POA: Diagnosis not present

## 2024-07-24 DIAGNOSIS — Z8673 Personal history of transient ischemic attack (TIA), and cerebral infarction without residual deficits: Secondary | ICD-10-CM | POA: Insufficient documentation

## 2024-07-24 DIAGNOSIS — E785 Hyperlipidemia, unspecified: Secondary | ICD-10-CM | POA: Insufficient documentation

## 2024-07-24 DIAGNOSIS — Z8774 Personal history of (corrected) congenital malformations of heart and circulatory system: Secondary | ICD-10-CM

## 2024-07-24 DIAGNOSIS — I639 Cerebral infarction, unspecified: Secondary | ICD-10-CM

## 2024-07-24 HISTORY — PX: LEFT HEART CATH AND CORONARY ANGIOGRAPHY: CATH118249

## 2024-07-24 LAB — BASIC METABOLIC PANEL WITH GFR
Anion gap: 10 (ref 5–15)
BUN: 10 mg/dL (ref 6–20)
CO2: 22 mmol/L (ref 22–32)
Calcium: 9.1 mg/dL (ref 8.9–10.3)
Chloride: 105 mmol/L (ref 98–111)
Creatinine, Ser: 0.87 mg/dL (ref 0.44–1.00)
GFR, Estimated: >60 mL/min (ref >60)
Glucose, Bld: 86 mg/dL (ref 70–99)
Potassium: 3.1 mmol/L — ABNORMAL LOW (ref 3.5–5.1)
Sodium: 137 mmol/L (ref 135–145)

## 2024-07-24 LAB — CBC
HCT: 41 % (ref 36.0–46.0)
Hemoglobin: 14 g/dL (ref 12.0–15.0)
MCH: 30.7 pg (ref 26.0–34.0)
MCHC: 34.1 g/dL (ref 30.0–36.0)
MCV: 89.9 fL (ref 80.0–100.0)
Platelets: 363 K/uL (ref 150–400)
RBC: 4.56 MIL/uL (ref 3.87–5.11)
RDW: 12.3 % (ref 11.5–15.5)
WBC: 8.9 K/uL (ref 4.0–10.5)
nRBC: 0 % (ref 0.0–0.2)

## 2024-07-24 LAB — PREGNANCY, URINE: Preg Test, Ur: NEGATIVE

## 2024-07-24 SURGERY — LEFT HEART CATH AND CORONARY ANGIOGRAPHY
Anesthesia: LOCAL

## 2024-07-24 MED ORDER — ONDANSETRON HCL 4 MG/2ML IJ SOLN: 4.0000 mg | Freq: Four times a day (QID) | INTRAMUSCULAR | Status: DC | PRN

## 2024-07-24 MED ORDER — HEPARIN (PORCINE) IN NACL 1000-0.9 UT/500ML-% IV SOLN
INTRAVENOUS | Status: DC | PRN
  Administered 2024-07-24 (×2): 500 mL

## 2024-07-24 MED ORDER — FREE WATER: 500.0000 mL | Freq: Once | Status: DC

## 2024-07-24 MED ORDER — ASPIRIN 81 MG PO CHEW: 81.0000 mg | CHEWABLE_TABLET | ORAL | Status: DC

## 2024-07-24 MED ORDER — FENTANYL CITRATE (PF) 100 MCG/2ML IJ SOLN
INTRAMUSCULAR | Status: DC | PRN
  Administered 2024-07-24: 25 ug via INTRAVENOUS

## 2024-07-24 MED ORDER — LIDOCAINE HCL (PF) 1 % IJ SOLN
INTRAMUSCULAR | Status: DC | PRN
Start: 2024-07-24 — End: 2024-07-24
  Administered 2024-07-24: 2 mL via INTRADERMAL

## 2024-07-24 MED ORDER — FENTANYL CITRATE (PF) 100 MCG/2ML IJ SOLN
INTRAMUSCULAR | Status: AC
  Filled 2024-07-24: qty 2

## 2024-07-24 MED ORDER — SODIUM CHLORIDE 0.9 % IV SOLN: 250.0000 mL | INTRAVENOUS | Status: DC | PRN

## 2024-07-24 MED ORDER — VERAPAMIL HCL 2.5 MG/ML IV SOLN
INTRAVENOUS | Status: AC
  Filled 2024-07-24: qty 2

## 2024-07-24 MED ORDER — HEPARIN SODIUM (PORCINE) 1000 UNIT/ML IJ SOLN
INTRAMUSCULAR | Status: DC | PRN
  Administered 2024-07-24: 4000 [IU] via INTRAVENOUS

## 2024-07-24 MED ORDER — VERAPAMIL HCL 2.5 MG/ML IV SOLN
INTRAVENOUS | Status: DC | PRN
  Administered 2024-07-24: 10 mL via INTRA_ARTERIAL

## 2024-07-24 MED ORDER — SODIUM CHLORIDE 0.9 % IV SOLN
INTRAVENOUS | Status: DC | PRN
  Administered 2024-07-24: 10 mL/h via INTRAVENOUS

## 2024-07-24 MED ORDER — HEPARIN SODIUM (PORCINE) 1000 UNIT/ML IJ SOLN
INTRAMUSCULAR | Status: AC
  Filled 2024-07-24: qty 10

## 2024-07-24 MED ORDER — IOHEXOL 350 MG/ML SOLN
INTRAVENOUS | Status: DC | PRN
  Administered 2024-07-24: 20 mL

## 2024-07-24 MED ORDER — ACETAMINOPHEN 325 MG PO TABS: 650.0000 mg | ORAL_TABLET | ORAL | Status: DC | PRN

## 2024-07-24 MED ORDER — SODIUM CHLORIDE 0.9% FLUSH: 3.0000 mL | INTRAVENOUS | Status: DC | PRN

## 2024-07-24 MED ORDER — MIDAZOLAM HCL (PF) 2 MG/2ML IJ SOLN
INTRAMUSCULAR | Status: DC | PRN
  Administered 2024-07-24: 1 mg via INTRAVENOUS

## 2024-07-24 MED ORDER — SODIUM CHLORIDE 0.9% FLUSH: 3.0000 mL | Freq: Two times a day (BID) | INTRAVENOUS | Status: DC

## 2024-07-24 MED ORDER — MIDAZOLAM HCL 2 MG/2ML IJ SOLN
INTRAMUSCULAR | Status: AC
  Filled 2024-07-24: qty 2

## 2024-07-24 MED ORDER — LIDOCAINE HCL (PF) 1 % IJ SOLN
INTRAMUSCULAR | Status: AC
  Filled 2024-07-24: qty 30

## 2024-07-24 SURGICAL SUPPLY — 6 items
CATH INFINITI AMBI 5FR TG (CATHETERS) IMPLANT
DEVICE RAD COMP TR BAND LRG (VASCULAR PRODUCTS) IMPLANT
GLIDESHEATH SLEND A-KIT 6F 22G (SHEATH) IMPLANT
GUIDEWIRE INQWIRE 1.5J.035X260 (WIRE) IMPLANT
PACK CARDIAC CATHETERIZATION (CUSTOM PROCEDURE TRAY) ×1 IMPLANT
SET ATX-X65L (MISCELLANEOUS) IMPLANT

## 2024-07-24 NOTE — Discharge Instructions (Signed)

## 2024-07-24 NOTE — H&P (Signed)
 Cardiology Admission (Observation)   Patient ID: Kelsey Friedman; MRN: [id number]; DOB: 04-Nov-1980   Admission date: (Not on file)  Primary Care Provider: Rexanne Ingle, MD Primary Cardiologist: Dr. Ladona   Chief Complaint:  DOD: Chest pain  Patient Profile:   Kelsey Friedman is a 43 y.o. female family history of early CAD with chest pain requiring 19 nitroglycerin  tablets.  History of Present Illness:   Kelsey Friedman a history of PFO closure and stroke, presents with chest pain.  She has a history of patent foramen ovale (PFO) closure following a stroke in April. Despite the closure, she experienced another stroke and has had episodes of palpitations. No atrial fibrillation or flutter has been noted since the PFO closure. She is currently on aspirin  monotherapy for her PFO.  She experiences significant chest pain, which has been managed with nitroglycerin , having taken more than ten doses. Nitroglycerin  resolves her pain. She has a strong family history of coronary artery disease, with a relative who had a massive heart attack and required an intra-aortic balloon pump.  She has a history of asthma and has experienced shortness of breath, which she describes as an asthma flare. She has been treated with steroids for a COPD exacerbation in the past.  Her recent emergency department visit did not include troponin testing. She has a low potassium level. A CT from 2022 showed no coronary calcifications, and a bubble study was positive.  She was asked to f/u with cardiology after her asthma exacerbation  This AM ~ 7 ish, ate keto bread, avocado, and eggs.  Allergies:   No Known Allergies  Social History:   Social History   Socioeconomic History   Marital status: Married    Spouse name: Not on file   Number of children: Not on file   Years of education: Not on file   Highest education level: Not on file  Occupational History   Not on file  Tobacco Use   Smoking status:  Never   Smokeless tobacco: Never  Vaping Use   Vaping status: Never Used  Substance and Sexual Activity   Alcohol use: Not Currently    Comment: socially   Drug use: No   Sexual activity: Not on file  Other Topics Concern   Not on file  Social History Narrative   Not on file   Social Drivers of Health   Financial Resource Strain: Not on file  Food Insecurity: No Food Insecurity (01/05/2024)   Hunger Vital Sign    Worried About Running Out of Food in the Last Year: Never true    Ran Out of Food in the Last Year: Never true  Transportation Needs: No Transportation Needs (01/05/2024)   PRAPARE - Administrator, Civil Service (Medical): No    Lack of Transportation (Non-Medical): No  Physical Activity: Not on file  Stress: Not on file  Social Connections: Unknown (12/22/2023)   Social Connection and Isolation Panel    Frequency of Communication with Friends and Family: More than three times a week    Frequency of Social Gatherings with Friends and Family: Once a week    Attends Religious Services: Not on Insurance Claims Handler of Clubs or Organizations: Not on file    Attends Banker Meetings: Not on file    Marital Status: Married  Intimate Partner Violence: Not At Risk (01/05/2024)   Humiliation, Afraid, Rape, and Kick questionnaire    Fear of Current or  Ex-Partner: No    Emotionally Abused: No    Physically Abused: No    Sexually Abused: No    Family History:   The patient's family history includes Allergies in her daughter; Cancer in her sister; Diabetes in her brother; Emphysema in her maternal uncle; Heart attack (age of onset: 27) in her father; Stroke in her sister. There is no history of Allergic rhinitis, Angioedema, Asthma, Eczema, or Urticaria.    Father has IABP placed in the past and died from CAD  ROS:  Please see the history of present illness.   Physical Exam/Data:   07/24/24 06/28/24 04/16/24   Vitals  BP 110/80 114/82 107/74  Pulse  Rate 74 87 --  Resp -- -- 16  SpO2 -- 95 % 99 %  Height 5' 4 (1.626 m) 5' 4 (1.626 m) 5' 4 (1.626 m)  Weight 173 lb (78.5 kg) 174 lb 12.8 oz (79.3 kg) 170 lb 12.8 oz (77.5 kg)  BMI (Calculated) 29.68 29.99 29.3    Gen: no distress, Cardiac: No Rubs or Gallops, no Murmur, RRR, +1 bilateral ulnar and radial pulses +2 R Femoral pulse Respiratory: Clear to auscultation bilaterally, normal effort, normal  respiratory rate GI: Soft, nontender, non-distended  MS: No  edema;  moves all extremities Integument: Skin feels warm Neuro:  At time of evaluation, alert and oriented to person/place/time/situation  Psych: Normal affect, patient feels poorly    EKG:  The ECG that was done  was personally reviewed and demonstrates SR with no clear ischemic signs  Relevant CV Studies:  Cardiac Studies & Procedures   ______________________________________________________________________________________________ CARDIAC CATHETERIZATION  CARDIAC CATHETERIZATION 01/24/2024  Conclusion Interventional data: 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 Post-deployment double contrast study revealed NO residual shunting.  Recommendation: Patient will continue with dual antiplatelet therapy with aspirin  and Plavix  for a period of 30 days, THEN ASA FOR ADDITIONAL 5 MONTHS. Will also need endocarditis prophylaxis for repair of 6 months.     ECHOCARDIOGRAM  ECHOCARDIOGRAM COMPLETE BUBBLE STUDY 12/23/2023  Narrative ECHOCARDIOGRAM REPORT    Patient Name:   Kelsey Friedman [facility] Date of Exam: 12/23/2023 Medical Rec #:  [id number]        Height:       64.0 in Accession #:    [id number]       Weight:       158.0 lb Date of Birth:  Apr 13, 1981        BSA:          1.770 m Patient Age:    43 years         BP:           108/85 mmHg Patient Gender: F                HR:           84 bpm. Exam Location:   Inpatient  Procedure: 2D Echo, Cardiac Doppler, Color Doppler and Saline Contrast Bubble Study (Both Spectral and Color Flow Doppler were utilized during procedure).  Indications:    Stroke  History:        Patient has prior history of Echocardiogram examinations, most recent 02/18/2010. Stroke, Signs/Symptoms:Dyspnea and Shortness of Breath; Risk Factors:Hypertension. Pulmonary embolus.  Sonographer:    Ellouise Mose RDCS Referring Phys: [id number] DEVON SHAFER  IMPRESSIONS   1. Left ventricular ejection fraction, by estimation, is 60 to 65%. The left ventricle has normal function.  The left ventricle has no regional wall motion abnormalities. Left ventricular diastolic parameters were normal. 2. Right ventricular systolic function is normal. The right ventricular size is normal. Tricuspid regurgitation signal is inadequate for assessing PA pressure. 3. The mitral valve is normal in structure. Trivial mitral valve regurgitation. No evidence of mitral stenosis. 4. The aortic valve is normal in structure. Aortic valve regurgitation is not visualized. No aortic stenosis is present. 5. Aortic dilatation noted. There is borderline dilatation of the ascending aorta, measuring 39 mm. 6. The inferior vena cava is normal in size with greater than 50% respiratory variability, suggesting right atrial pressure of 3 mmHg. 7. Evidence of atrial level shunting detected by color flow Doppler. Agitated saline contrast bubble study was positive with shunting observed within 3-6 cardiac cycles suggestive of interatrial shunt.  FINDINGS Left Ventricle: Left ventricular ejection fraction, by estimation, is 60 to 65%. The left ventricle has normal function. The left ventricle has no regional wall motion abnormalities. The left ventricular internal cavity size was normal in size. There is no left ventricular hypertrophy. Left ventricular diastolic parameters were normal.  Right Ventricle: The right ventricular size  is normal. No increase in right ventricular wall thickness. Right ventricular systolic function is normal. Tricuspid regurgitation signal is inadequate for assessing PA pressure.  Left Atrium: Left atrial size was normal in size.  Right Atrium: Right atrial size was normal in size.  Pericardium: There is no evidence of pericardial effusion.  Mitral Valve: The mitral valve is normal in structure. Trivial mitral valve regurgitation. No evidence of mitral valve stenosis.  Tricuspid Valve: The tricuspid valve is normal in structure. Tricuspid valve regurgitation is trivial. No evidence of tricuspid stenosis.  Aortic Valve: The aortic valve is normal in structure. Aortic valve regurgitation is not visualized. No aortic stenosis is present.  Pulmonic Valve: The pulmonic valve was normal in structure. Pulmonic valve regurgitation is trivial. No evidence of pulmonic stenosis.  Aorta: Aortic dilatation noted. There is borderline dilatation of the ascending aorta, measuring 39 mm.  Venous: The inferior vena cava is normal in size with greater than 50% respiratory variability, suggesting right atrial pressure of 3 mmHg.  IAS/Shunts: Evidence of atrial level shunting detected by color flow Doppler. Agitated saline contrast was given intravenously to evaluate for intracardiac shunting. Agitated saline contrast bubble study was positive with shunting observed within 3-6 cardiac cycles suggestive of interatrial shunt.   LEFT VENTRICLE PLAX 2D LVIDd:         4.80 cm     Diastology LVIDs:         2.80 cm     LV e' medial:    10.80 cm/s LV PW:         0.90 cm     LV E/e' medial:  7.3 LV IVS:        1.00 cm     LV e' lateral:   10.90 cm/s LVOT diam:     2.30 cm     LV E/e' lateral: 7.2 LV SV:         90 LV SV Index:   51 LVOT Area:     4.15 cm  LV Volumes (MOD) LV vol d, MOD A2C: 79.5 ml LV vol d, MOD A4C: 81.4 ml LV vol s, MOD A2C: 34.5 ml LV vol s, MOD A4C: 30.2 ml LV SV MOD A2C:     45.0  ml LV SV MOD A4C:     81.4 ml LV SV MOD BP:  48.8 ml  RIGHT VENTRICLE             IVC RV S prime:     14.80 cm/s  IVC diam: 1.90 cm TAPSE (M-mode): 2.2 cm  LEFT ATRIUM             Index        RIGHT ATRIUM           Index LA diam:        2.90 cm 1.64 cm/m   RA Area:     11.20 cm LA Vol (A2C):   50.1 ml 28.31 ml/m  RA Volume:   23.00 ml  13.00 ml/m LA Vol (A4C):   20.6 ml 11.64 ml/m LA Biplane Vol: 33.0 ml 18.65 ml/m AORTIC VALVE LVOT Vmax:   115.00 cm/s LVOT Vmean:  78.100 cm/s LVOT VTI:    0.216 m  AORTA Ao Root diam: 3.10 cm Ao Asc diam:  3.90 cm  MITRAL VALVE MV Area (PHT): 4.49 cm    SHUNTS MV Decel Time: 169 msec    Systemic VTI:  0.22 m MV E velocity: 79.00 cm/s  Systemic Diam: 2.30 cm MV A velocity: 67.40 cm/s MV E/A ratio:  1.17  Toribio Fuel MD Electronically signed by Toribio Fuel MD Signature Date/Time: 12/23/2023/2:34:58 PM    Final    MONITORS  LONG TERM MONITOR (3-14 DAYS) 03/14/2024  Narrative Images from the original result were not included. Remote outpatient EKG monitoring for 9 days for palpitations starting 02/28/2024: Predominant rhythm was normal sinus rhythm.  Minimum heart rate was 56 bpm at 12:40 AM and maximum of 155 bpm at 5:29 PM with average heart rate of 90 bpm. There are rare frequent (476) SVT episodes, longest 20 seconds with average rate of 154 bpm.  EKG reveals atrial tachycardia.  These were symptomatic. Occasional PACs and atrial couplets and triplets were noted.  Rare PVCs.      Gordy Bergamo, MD, [facility] 03/14/2024, 10:07 PM [facility] [address] [place], [place] [address] Phone: [phone]. Fax:  [phone]       ______________________________________________________________________________________________       Assessment and Plan:        Chest pain, evaluation for coronary artery disease including possible coronary microvascular disease - Intermittent chest pain with significant  nitroglycerin  use, suggesting possible coronary artery disease or coronary microvascular disease. Strong family history of coronary artery disease. Differential includes coronary artery disease, coronary microvascular disease, and non-cardiac causes such as asthma exacerbation. Previous CT from 2022 showed no coronary calcifications, but she is young. EKG non-localizing, no troponins done in recent ED visit. Decision to proceed with invasive evaluation due to high nitroglycerin  use and family history. - Admitted for observation and heart catheterization to evaluate for coronary artery disease and coronary microvascular disease. - if normal, needs pulm f/u at DC  Risks and benefits of cardiac catheterization have been discussed with the patient.  These include bleeding, infection, kidney damage, stroke, heart attack, death.  The patient understands these risks and is willing to proceed.  Access recommendations: R Femoral vs R radial with US ; recommend groin preparation  Procedural considerations: no barriers to PCI  Patent foramen ovale, status post closure Status post PFO closure with no current evidence of pulmonary hypertension or significant residual PFO. Previous right heart catheterization did not indicate significant pulmonary hypertension. - symptoms are in-consistent with erosion, would get echocardiogram  Asthma and chronic obstructive pulmonary disease with recent exacerbation Recent exacerbation of asthma and COPD with  elevated white blood cell count, likely due to steroid use. Symptoms include shortness of breath and palpitations. Consideration of non-cardiac causes for chest pain and shortness of breath.  Hypokalemia Noted on recent labs, requiring further evaluation and management. - Will repeat BMP to assess potassium levels.   Full Code   Severity of Illness: The appropriate patient status for this patient is OBSERVATION. Observation status is judged to be reasonable and  necessary in order to provide the required intensity of service to ensure the patient's safety. The patient's presenting symptoms, physical exam findings, and initial radiographic and laboratory data in the context of their medical condition is felt to place them at decreased risk for further clinical deterioration. Furthermore, it is anticipated that the patient will be medically stable for discharge from the hospital within 2 midnights of admission.    For questions or updates, please contact [facility] Please consult [email or web address] for contact info under Cardiology/STEMI.   Stanly Leavens, MD FASE [facility] Cardiologist [facility]  [address] [place], [address] [place], [place] [address] [phone]  9:36 AM

## 2024-07-24 NOTE — Patient Instructions (Signed)
 Medication Instructions:  Your physician recommends that you continue on your current medications as directed. Please refer to the Current Medication list given to you today.  *If you need a refill on your cardiac medications before your next appointment, please call your pharmacy*  Lab Work: NONE ordered at this time of appointment   Testing/Procedures: NONE ordered at this time of appointment   Follow-Up: At [facility], you and your health needs are our priority.  As part of our continuing mission to provide you with exceptional heart care, our providers are all part of one team.  This team includes your primary Cardiologist (physician) and Advanced Practice Providers or APPs (Physician Assistants and Nurse Practitioners) who all work together to provide you with the care you need, when you need it.  Your next appointment:    2 weeks post CATH   Provider:   Jon Hails, PA-C          We recommend signing up for the patient portal called MyChart.  Sign up information is provided on this After Visit Summary.  MyChart is used to connect with patients for Virtual Visits (Telemedicine).  Patients are able to view lab/test results, encounter notes, upcoming appointments, etc.  Non-urgent messages can be sent to your provider as well.   To learn more about what you can do with MyChart, go to [email or web address].   Other Instructions Proceed to the [facility] as directed

## 2024-07-24 NOTE — Progress Notes (Signed)
 Discharge instructions reviewed with patient and spouse at bedside. Denies questions concerns. PT tolerated PO intake. Incision site remains clean dry and intact. No s/s of complications. PT escorted from the unit via wheel chair to personal vehicle.

## 2024-07-24 NOTE — Interval H&P Note (Signed)
 History and Physical Interval Note:  07/24/2024 1:52 PM  Kelsey Friedman  has presented today for surgery, with the diagnosis of unstable angina.  The various methods of treatment have been discussed with the patient and family. After consideration of risks, benefits and other options for treatment, the patient has consented to  Procedure(s): LEFT HEART CATH AND CORONARY ANGIOGRAPHY (N/A) and possible coronary angioplasty as a surgical intervention.  The patient's history has been reviewed, patient examined, no change in status, stable for surgery.  I have reviewed the patient's chart and labs.  Questions were answered to the patient's satisfaction.     Gordy Bergamo

## 2024-07-25 ENCOUNTER — Encounter: Payer: Self-pay | Admitting: Cardiology

## 2024-07-25 ENCOUNTER — Encounter (HOSPITAL_COMMUNITY): Payer: Self-pay | Admitting: Cardiology

## 2024-07-25 ENCOUNTER — Encounter (HOSPITAL_COMMUNITY): Payer: Self-pay

## 2024-07-25 NOTE — Telephone Encounter (Signed)
 Please cancel stress test.

## 2024-07-30 NOTE — Telephone Encounter (Signed)
 Pts Cardiac PET test looks like it was canceled. Pt didn't need to proceed with that procedure correct?

## 2024-07-31 ENCOUNTER — Encounter (HOSPITAL_COMMUNITY): Admission: RE | Admit: 2024-07-31 | Source: Ambulatory Visit

## 2024-07-31 NOTE — Telephone Encounter (Signed)
 Noted

## 2024-08-03 ENCOUNTER — Telehealth: Payer: Self-pay | Admitting: Cardiology

## 2024-08-03 NOTE — Telephone Encounter (Signed)
 Patient called to cancel her 2 week follow up for cath procedure. Patient stated when she had the procedure, Dr. Ladona stated her valves look good and would not need the follow up. This is just an FYI.

## 2024-08-06 ENCOUNTER — Encounter: Payer: Self-pay | Admitting: Neurology

## 2024-08-06 ENCOUNTER — Ambulatory Visit: Admitting: Neurology

## 2024-08-06 ENCOUNTER — Other Ambulatory Visit (HOSPITAL_COMMUNITY): Payer: Self-pay

## 2024-08-06 VITALS — BP 116/86 | HR 80 | Ht 64.0 in | Wt 172.8 lb

## 2024-08-06 DIAGNOSIS — G4440 Drug-induced headache, not elsewhere classified, not intractable: Secondary | ICD-10-CM

## 2024-08-06 DIAGNOSIS — R2990 Unspecified symptoms and signs involving the nervous system: Secondary | ICD-10-CM | POA: Diagnosis not present

## 2024-08-06 DIAGNOSIS — G43909 Migraine, unspecified, not intractable, without status migrainosus: Secondary | ICD-10-CM | POA: Diagnosis not present

## 2024-08-06 DIAGNOSIS — G44209 Tension-type headache, unspecified, not intractable: Secondary | ICD-10-CM

## 2024-08-06 MED ORDER — TOPIRAMATE 25 MG PO TABS: 25.0000 mg | ORAL_TABLET | Freq: Two times a day (BID) | ORAL | 3 refills | Status: AC

## 2024-08-06 MED ORDER — NURTEC 75 MG PO TBDP: 1.0000 | ORAL_TABLET | ORAL | 1 refills | Status: AC | PRN

## 2024-08-06 NOTE — Patient Instructions (Addendum)
 I had a long discussion with the patient regarding her stroke like episode, PFO and endovascular closure, migraine tension headaches, rebound headaches, risk for recurrent stroke/TIAs, personally independently reviewed imaging studies and stroke evaluation results and answered questions.Continue aspirin  81 mg daily  for secondary stroke prevention and maintain strict control of hypertension with blood pressure goal below 130/90, diabetes with hemoglobin A1c goal below 6.5% and lipids with LDL cholesterol goal below 70 mg/dL. I also advised the patient to eat a healthy diet with plenty of whole grains, cereals, fruits and vegetables, exercise regularly and maintain ideal body weight .  .I recommend she discontinue Tylenol  as she is taking too many which may be contributing to analgesic rebound headaches.  Trial of Nurtec 7.5 mg for symptomatic relief Topamax  25 mg twice daily for headache prophylaxis.  I advised her to increase participation in regular activities for stress relaxation like exercise, meditation and yoga.  I encouraged her to do regular  neck stretching exercises daily.   Return for follow-up in the future in 3 months with my nurse practitioner call earlier if necessary.  Analgesic Rebound Headache An analgesic rebound headache is a secondary disorder that is caused by the overuse of pain medicine (analgesic) to treat the original (primary) headache. It is sometimes called a medication overuse headache or a drug-induced headache. Any type of primary headache can return as a rebound headache if a person regularly takes pain medicine. The types of primary headaches that are commonly related to rebound headaches include: Migraines. Tension headaches. These are caused by tense muscles in the head and neck area. Cluster headaches. These happen on one side of the head and around the eye. If rebound headaches continue, they can become long-term, daily headaches. What are the causes? Rebound  headaches may be caused by frequent use of: Over-the-counter medicines, such as aspirin , ibuprofen , and acetaminophen . Sinus-relief medicines. Medicines that contain caffeine. Narcotic pain medicines, such as codeine and oxycodone . Some prescription migraine medicines. What are the signs or symptoms? The symptoms of a rebound headache are the same as the symptoms of the primary headache. Symptoms of specific types of headaches include: Migraine headache Pulsing or throbbing pain on one or both sides of the head. Severe pain that makes it hard to do daily activities. Pain that gets worse with physical activity. Nausea, vomiting, or both. Pain that may get worse around bright lights, loud noises, or smells. Vision changes. Numbness in one or both arms. Tension headache Pressure around the head. Dull, aching head pain. Pain felt over the front and sides of the head. Tenderness in the muscles of the head, neck, and shoulders. Cluster headache Severe pain that begins in or around one eye or temple. Droopy or swollen eyelid, or redness and tearing in the eye on the same side as the pain. One-sided head pain. Nausea. Runny nose. Sweaty, pale facial skin. Restlessness. How is this diagnosed? Rebound headaches are diagnosed by reviewing: Your medical history, including a description of your primary headaches. Your pain medicines for your primary headaches and how often you take them. How is this treated? Rebound headaches may be treated or managed by: Stopping frequent use of pain medicine. This may make your headaches worse at first, but the pain should then become less manageable and less frequent and severe. Seeing a headache specialist. They may be able to help you manage your headaches and help make sure there is not another cause of the headaches. Using methods of stress relief, such as acupuncture,  counseling, biofeedback, and massage. Follow these instructions at  home: Medicines  Take over-the-counter and prescription medicines only as told by your health care provider. Stop the repeated use of pain medicine as told by your provider. Stopping can be hard. Carefully follow instructions from your provider. Lifestyle Do not drink alcohol. Do not use any products that contain nicotine or tobacco. These products include cigarettes, chewing tobacco, and vaping devices, such as e-cigarettes. If you need help quitting, ask your provider. Get 7-9 hours of sleep each night, or the amount recommended by your provider. Find ways to manage stress, such as acupuncture, counseling, biofeedback, and massage. Exercise regularly. Exercise for at least 30 minutes, 5 times each week. General instructions Avoid things that may bring on (trigger) your primary headaches, such as certain foods. Contact a health care provider if: Medicine does not help your migraine. Your pain keeps coming back even with medicine. Get help right away if: You have new headache pain. You have headache pain that is different than what you have felt in the past. You have numbness or tingling in your arms or legs. You have changes in your speech or vision. This information is not intended to replace advice given to you by your health care provider. Make sure you discuss any questions you have with your health care provider. Document Revised: 04/12/2022 Document Reviewed: 04/12/2022 Elsevier Patient Education  2024 [employer].

## 2024-08-06 NOTE — Progress Notes (Signed)
 [facility] [address] [place]. [place] [address] [phone]       OFFICE FOLLOW-UP NOTE  [name] Date of Birth:  September 14, 1980 Medical Record Number:  [id number]   HPI:  Update 08/06/2024 : She returns for follow-up after last visit 5 months ago.  Patient was seen in the ER on 06/27/2024 by Dr. Jerri for headache.  Headache began in the morning in the left frontal and temporal region and she also describes some burning sensation and brain fogginess.  She also developed some heaviness subsequently a few hours later on the left side.  She spoke to her manager and has been going to the ER for workup.  MRI scan of the brain was obtained which showed no acute abnormality.  Patient was given migraine cocktail.  Patient was advised to take Topamax  for management but has not done.  She states she continues to have headaches around 4 to 5 days a week.  She states she takes 4-8 tablets of Tylenol  extra strength daily but yet the headaches does not completely go away.  Describes some light and sound sensitivity.  Remains on aspirin  for stroke prevention with Lipitor and is tolerating it well without side effects.  She states her blood pressure is under good control.  He had a follow-up TCD bubble study on 04/11/2024 with showed small residual right-to-left shunt despite endovascular PFO closure.  Cardiac catheterization by Dr. Ladona on 07/24/2024 showed no significant coronary artery disease. Office visit 03/07/2024 [name] is a pleasant 43 year old African-American lady seen today for initial office follow-up visit following hospital admission for stroke in April 2025.  History is obtained from the patient and review of electronic medical records.  I personally reviewed pertinent available imaging films in PACS. She has past medical history of hypertension, hypothyroidism, supraventricular tachycardia.  She presented on 12/22/2023 for evaluation for sudden onset of numbness in the  left side of the face and left upper extremity weakness.  She went to urgent care who activated a code stroke.  On arrival she was noted to have tingling on the left side of her face as well as facial asymmetry.  On exam she was found of sensory deficit on the left face arm and leg and facial droop.  After discussion of risk benefits and alternatives she was given IV TNK uneventfully.  She did well and symptoms resolved shortly thereafter.  She was monitored in the ICU as per post TNK protocol.  MRI scan of the brain was negative for acute stroke.  CTh showed no acute abnormalities but incidental 5 mm cerebellar tonsillar ectopia.  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  2D echo showed normal ejection fraction with positive right to left shunt.  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  This was felt to be incidental.  Patient however saw cardiology as outpatient and had endovascular PFO closure by Dr. Ladona on 01/24/2024.  LDL cholesterol was 93 mg percent.  Hemoglobin A1c was 4.9.  Lower extremity venous Dopplers were negative for DVT.  Patient was discharged home on aspirin  and Plavix  for 3 weeks and then aspirin  alone.  She has restarted Plavix  after PFO closure and back on dual antiplatelet therapy now for 6 months.  She states she is doing well.  She is having minor bruising and no bleeding.  She is tolerating Lipitor without side  effects.  She has been complaining of new headaches last few months.  She states that headaches quite frequently and neck up to 3 times a week.  She takes Tylenol  which does help but she has been taking it almost every day.  Headaches are bitemporal and at times bifrontal throbbing usually moderate in intensity but occasionally severe.  There is no accompanying nausea or vomiting and there is occasional light central DVT and she feels better if she lies down closes her  eyes.  She denies any prior history of migraine headaches.  She has not yet returned back to work.  She has had no recurrent stroke or TIA symptoms. ROS:   14 system review of systems is positive for headache, palpitations, numbness, weakness, speech difficulties all other systems negative  PMH:  Past Medical History:  Diagnosis Date   Asthma    Cerebral aneurysm    Chiari I malformation (HCC)    HLD (hyperlipidemia)    HTN (hypertension)    Hypothyroidism    PE (pulmonary embolism)    SOB (shortness of breath)     Social History:  Social History   Socioeconomic History   Marital status: Married    Spouse name: Not on file   Number of children: Not on file   Years of education: Not on file   Highest education level: Not on file  Occupational History   Not on file  Tobacco Use   Smoking status: Never   Smokeless tobacco: Never  Vaping Use   Vaping status: Never Used  Substance and Sexual Activity   Alcohol use: Not Currently    Comment: socially   Drug use: No   Sexual activity: Not on file  Other Topics Concern   Not on file  Social History Narrative   Not on file   Social Drivers of Health   Financial Resource Strain: Not on file  Food Insecurity: No Food Insecurity (01/05/2024)   Hunger Vital Sign    Worried About Running Out of Food in the Last Year: Never true    Ran Out of Food in the Last Year: Never true  Transportation Needs: No Transportation Needs (01/05/2024)   PRAPARE - Administrator, Civil Service (Medical): No    Lack of Transportation (Non-Medical): No  Physical Activity: Not on file  Stress: Not on file  Social Connections: Unknown (12/22/2023)   Social Connection and Isolation Panel    Frequency of Communication with Friends and Family: More than three times a week    Frequency of Social Gatherings with Friends and Family: Once a week    Attends Religious Services: Not on Marketing Executive or Organizations: Not on file     Attends Banker Meetings: Not on file    Marital Status: Married  Intimate Partner Violence: Not At Risk (01/05/2024)   Humiliation, Afraid, Rape, and Kick questionnaire    Fear of Current or Ex-Partner: No    Emotionally Abused: No    Physically Abused: No    Sexually Abused: No    Medications:   Current Outpatient Medications on File Prior to Visit  Medication Sig Dispense Refill   albuterol  (PROAIR  HFA) 108 (90 Base) MCG/ACT inhaler Inhale 2 puffs into the lungs every 4 (four) hours as needed for wheezing or shortness of breath. 1 Inhaler 1   aspirin  EC 81 MG tablet Take 1 tablet (81 mg total) by mouth daily. Swallow whole. 30 tablet 12  atorvastatin  (LIPITOR) 40 MG tablet Take 1 tablet (40 mg total) by mouth daily. 90 tablet 3   diltiazem  (CARDIZEM  CD) 120 MG 24 hr capsule Take 1 capsule (120 mg total) by mouth 2 (two) times daily. 180 capsule 1   ferrous sulfate 325 (65 FE) MG tablet Take 325 mg by mouth daily.     FIBER GUMMIES PO Take 5 g by mouth daily.     levothyroxine  (SYNTHROID ) 75 MCG tablet Take 75 mcg by mouth every morning.     loratadine (CLARITIN) 10 MG tablet Take 10 mg by mouth daily as needed for allergies.     losartan-hydrochlorothiazide (HYZAAR) 50-12.5 MG tablet Take 1 tablet by mouth daily.     Magnesium 200 MG CHEW Chew 2 tablets by mouth daily.     Multiple Vitamin (MULTIVITAMIN) tablet Take 1 tablet by mouth daily.     zolpidem (AMBIEN CR) 12.5 MG CR tablet Take 12.5 mg by mouth at bedtime as needed for sleep.     nitroGLYCERIN  (NITROSTAT ) 0.4 MG SL tablet Place 1 tablet (0.4 mg total) under the tongue every 5 (five) minutes as needed for chest pain. (Patient not taking: Reported on 08/06/2024) 25 tablet 3   potassium chloride  SA (KLOR-CON  M) 20 MEQ tablet Take 1 tablet (20 mEq total) by mouth 2 (two) times daily for 4 days. (Patient not taking: Reported on 08/06/2024) 8 tablet 0   Probiotic Product (CULTURELLE PRO & PREBIOTIC PO) Take 2 capsules  by mouth daily. (Patient not taking: Reported on 08/06/2024)     No current facility-administered medications on file prior to visit.    Allergies:  No Known Allergies  Physical Exam General: well developed, well nourished, seated, in no evident distress Head: head normocephalic and atraumatic.  Neck: supple with no carotid or supraclavicular bruits Cardiovascular: regular rate and rhythm, no murmurs Musculoskeletal: no deformity tightness of posterior neck muscles. Skin:  no rash/petichiae Vascular:  Normal pulses all extremities Vitals:   08/06/24 0906  BP: 116/86  Pulse: 80  SpO2: 98%   Neurologic Exam Mental Status: Awake and fully alert. Oriented to place and time. Recent and remote memory intact. Attention span, concentration and fund of knowledge appropriate. Mood and affect appropriate.  Cranial Nerves: Fundoscopic exam reveals sharp disc margins. Pupils equal, briskly reactive to light. Extraocular movements full without nystagmus. Visual fields full to confrontation. Hearing intact. Facial sensation intact. Face, tongue, palate moves normally and symmetrically.  Motor: Normal bulk and tone. Normal strength in all tested extremity muscles except for some giveaway weakness on the left.. Sensory.: intact to touch ,pinprick .position and vibratory sensation.  Coordination: Rapid alternating movements normal in all extremities. Finger-to-nose and heel-to-shin performed accurately bilaterally. Gait and Station: Arises from chair without difficulty. Stance is normal. Gait demonstrates normal stride length and balance with slight dragging of the left leg..  Tandem walking not attempted  reflexes: 1+ and symmetric. Toes downgoing.   NIHSS  0 Modified Rankin  1   ASSESSMENT: 43 year old African-American lady with strokelike episode in April 2025 presenting with headache and palpitation, dysarthria and left face and upper extremity numbness was treated with IV TNK but MRI was negative  for acute stroke.  PFO closed in May 2025.  She persistent almost daily headaches which appear to be mixed migraine and tension headaches with analgesic rebound.    PLAN: I had a long discussion with the patient regarding her stroke like episode, PFO and endovascular closure, Tension headaches,, risk for recurrent stroke/TIAs, personally  independently reviewed imaging studies and stroke evaluation results and answered questions.Continue aspirin  81 mg daily and clopidogrel  75 mg daily  for 6 months and then aspirin  alone for secondary stroke prevention and maintain strict control of hypertension with blood pressure goal below 130/90, diabetes with hemoglobin A1c goal below 6.5% and lipids with LDL cholesterol goal below 70 mg/dL. I also advised the patient to eat a healthy diet with plenty of whole grains, cereals, fruits and vegetables, exercise regularly and maintain ideal body weight .  Check follow-up TCD bubble study to look for adequacy of PFO closure..I recommend she discontinue Tylenol  as she is taking too many which may be contributing to analgesic rebound headaches.  I advised her to increase participation in regular activities for stress relaxation like exercise, meditation and yoga.  Doing neck stretching exercises daily.  She may return to work and drive.  Followup in the future with me only as necessary.    I personally spent a total of 40 minutes in the care of the patient today including getting/reviewing separately obtained history, performing a medically appropriate exam/evaluation, counseling and educating, placing orders, referring and communicating with other health care professionals, documenting clinical information in the EHR, independently interpreting results, and coordinating care.        Eather Popp, MD Note: This document was prepared with digital dictation and possible smart phrase technology. Any transcriptional errors that result from this process are unintentional

## 2024-08-08 ENCOUNTER — Other Ambulatory Visit (HOSPITAL_COMMUNITY): Payer: Self-pay

## 2024-08-08 ENCOUNTER — Telehealth: Payer: Self-pay

## 2024-08-08 NOTE — Telephone Encounter (Signed)
 Pharmacy Patient Advocate Encounter   Received notification from CoverMyMeds that prior authorization for Nurtec 75MG  dispersible tablets is required/requested.   Insurance verification completed.   The patient is insured through [employer].   Per test claim: PA required; PA started via CoverMyMeds. KEY [id number] . Waiting for clinical questions to populate.

## 2024-08-09 ENCOUNTER — Ambulatory Visit: Admitting: Physician Assistant

## 2024-08-09 ENCOUNTER — Other Ambulatory Visit (HOSPITAL_COMMUNITY): Payer: Self-pay

## 2024-08-09 NOTE — Telephone Encounter (Signed)
 Pharmacy Patient Advocate Encounter  Received notification from [employer] [facility] that Prior Authorization for Nurtec 75MG  dispersible tablets has been resolved, no additional PA is required.   Last filled 08-06-2024 and next fill is 08-31-2024. Insurance pays maximum of 16 tablets per 25 days  PA #/Case ID/Reference #: [id number]

## 2024-08-20 ENCOUNTER — Ambulatory Visit

## 2024-08-20 VITALS — BP 97/67 | HR 80 | Temp 98.8°F | Ht 64.0 in | Wt 171.0 lb

## 2024-08-20 DIAGNOSIS — J329 Chronic sinusitis, unspecified: Secondary | ICD-10-CM

## 2024-08-20 DIAGNOSIS — J4540 Moderate persistent asthma, uncomplicated: Secondary | ICD-10-CM

## 2024-08-20 DIAGNOSIS — Z86711 Personal history of pulmonary embolism: Secondary | ICD-10-CM

## 2024-08-20 LAB — CBC WITH DIFFERENTIAL/PLATELET
Basophils Absolute: 0.1 K/uL (ref 0.0–0.1)
Basophils Relative: 0.6 % (ref 0.0–3.0)
Eosinophils Absolute: 0.1 K/uL (ref 0.0–0.7)
Eosinophils Relative: 1.6 % (ref 0.0–5.0)
HCT: 43.8 % (ref 36.0–46.0)
Hemoglobin: 15 g/dL (ref 12.0–15.0)
Lymphocytes Relative: 26.2 % (ref 12.0–46.0)
Lymphs Abs: 2.4 K/uL (ref 0.7–4.0)
MCHC: 34.2 g/dL (ref 30.0–36.0)
MCV: 88.4 fl (ref 78.0–100.0)
Monocytes Absolute: 0.8 K/uL (ref 0.1–1.0)
Monocytes Relative: 8.3 % (ref 3.0–12.0)
Neutro Abs: 5.8 K/uL (ref 1.4–7.7)
Neutrophils Relative %: 63.3 % (ref 43.0–77.0)
Platelets: 362 K/uL (ref 150.0–400.0)
RBC: 4.95 Mil/uL (ref 3.87–5.11)
RDW: 12.8 % (ref 11.5–15.5)
WBC: 9.1 K/uL (ref 4.0–10.5)

## 2024-08-20 MED ORDER — BUDESONIDE-FORMOTEROL FUMARATE 160-4.5 MCG/ACT IN AERO: 2.0000 | INHALATION_SPRAY | Freq: Two times a day (BID) | RESPIRATORY_TRACT | 6 refills | Status: DC

## 2024-08-20 NOTE — Patient Instructions (Signed)
" °  VISIT SUMMARY: Today, we discussed your worsening asthma symptoms, including increased chest tightness and wheezing, especially at night. We have updated your treatment plan to better manage your asthma.  YOUR PLAN: MODERATE PERSISTENT ASTHMA: Your asthma symptoms have been worsening, with increased use of your rescue inhaler and nighttime symptoms. -Start using Symbicort  inhaler, two puffs in the morning and two puffs at night. -Rinse your mouth after using Symbicort  to prevent oral thrush. -Continue using albuterol  as a rescue inhaler as needed. -We have ordered a pulmonary function test to assess your asthma control. -We have ordered a chest x-ray to evaluate your lung condition. -Schedule a follow-up appointment in 4-6 weeks to assess your response to the new inhaler.       Contains text generated by Abridge.   "

## 2024-08-20 NOTE — Progress Notes (Signed)
 "  New Patient Pulmonology Office Visit   Subjective:  Patient ID: Kelsey Friedman, female    DOB: Jun 15, 1981  MRN: [id number]  Referred by: Madie Jon Garre, PA  CC:  Chief Complaint  Patient presents with   Consult    Pt was referred by cardio DR for asthma, Pt stated she has rescue inhaler  ut felt like it wasn't helping as much SOB occurs w/ any activity Pt has had some chest tightness When cold outside pt has a cough that occurs     HPI Kelsey Friedman is a 43 y.o. female who is referred to this clinic for evaluation of asthma.   Discussed the use of AI scribe software for clinical note transcription with the patient, who gave verbal consent to proceed.  History of Present Illness ABBAGALE GOGUEN is a 43 year old female with asthma who presents with worsening asthma symptoms. She was referred by her cardiologist for evaluation of asthma symptoms.  She reports chest tightness and wheezing, particularly at night, which sometimes wakes her from sleep. Colder days exacerbate her symptoms. She uses albuterol  more frequently, up to six times a day, due to feeling unable to catch her breath. She recalls using it eight times in one day, prompting her school nurse to suggest consulting her primary care provider. No recent hospital visits for asthma exacerbations.  Her asthma symptoms were previously controlled with an inhaler, specifically albuterol , and a medication that involved crushing pills. She has no history of childhood asthma and no significant environmental allergies, although she occasionally takes Claritin for pollen-related symptoms. She experiences nasal congestion at times but does not take allergy  medication daily.  She has a history of a stroke in April, attributed to a clot passing through a patent foramen ovale (PFO), which has since been closed. She is currently on aspirin  for this condition.  She has a history of pulmonary embolism on right upper lobe in 01/2020,  immediately postpartum.  Her family history includes a brother with a severe lung condition, possibly sarcoidosis, but no known family history of asthma. 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    ROS Review of system negative except above  Allergies: Patient has no known allergies. Current Medications[1] Past Medical History:  Diagnosis Date   Anxiety    Arrhythmia    Arthritis    Asthma    Cerebral aneurysm    CHF (congestive heart failure) (HCC)    PFO   Chiari I malformation (HCC)    HLD (hyperlipidemia)    HTN (hypertension)    Hypothyroidism    PE (pulmonary embolism)    SOB (shortness of breath)    Stroke [facility])    Past Surgical History:  Procedure Laterality Date   CHOLECYSTECTOMY     EYE SURGERY     LEFT HEART CATH AND CORONARY ANGIOGRAPHY N/A 07/24/2024   Procedure: LEFT HEART CATH AND CORONARY ANGIOGRAPHY;  Surgeon: Ladona Heinz, MD;  [facility];  Service: Cardiovascular;  Laterality: N/A;   PATENT FORAMEN OVALE(PFO) CLOSURE N/A 01/24/2024   Procedure: PATENT FORAMEN OVALE(PFO) CLOSURE;  Surgeon: Ladona Heinz, MD;  Location: [facility];  Service: Cardiovascular;  Laterality: N/A;   TONSILLECTOMY     Family History  Problem Relation Age of Onset   Hypertension Mother    Arthritis Mother    Heart attack Father        Decreased   Hyperlipidemia Father    Heart  disease Father    Cancer Sister        hodgkin   Stroke Sister    Diabetes Brother    Emphysema Maternal Uncle    Allergies Daughter    Allergic rhinitis Neg Hx    Angioedema Neg Hx    Asthma Neg Hx    Eczema Neg Hx    Urticaria Neg Hx    Social History   Socioeconomic History   Marital status: Married    Spouse name: Not on file   Number of children: Not on file   Years of education: Not on file   Highest education level: Not on file  Occupational History   Not on file  Tobacco Use   Smoking status: Never   Smokeless tobacco: Never   Vaping Use   Vaping status: Never Used  Substance and Sexual Activity   Alcohol use: Not Currently    Comment: socially   Drug use: No   Sexual activity: Not on file  Other Topics Concern   Not on file  Social History Narrative   Not on file   Social Drivers of Health   Tobacco Use: Low Risk (08/20/2024)   Patient History    Smoking Tobacco Use: Never    Smokeless Tobacco Use: Never    Passive Exposure: Not on file  Financial Resource Strain: Not on file  Food Insecurity: No Food Insecurity (01/05/2024)   Hunger Vital Sign    Worried About Running Out of Food in the Last Year: Never true    Ran Out of Food in the Last Year: Never true  Transportation Needs: No Transportation Needs (01/05/2024)   PRAPARE - Administrator, Civil Service (Medical): No    Lack of Transportation (Non-Medical): No  Physical Activity: Not on file  Stress: Not on file  Social Connections: Unknown (12/22/2023)   Social Connection and Isolation Panel    Frequency of Communication with Friends and Family: More than three times a week    Frequency of Social Gatherings with Friends and Family: Once a week    Attends Religious Services: Not on Marketing Executive or Organizations: Not on file    Attends Banker Meetings: Not on file    Marital Status: Married  Intimate Partner Violence: Not At Risk (01/05/2024)   Humiliation, Afraid, Rape, and Kick questionnaire    Fear of Current or Ex-Partner: No    Emotionally Abused: No    Physically Abused: No    Sexually Abused: No  Depression (PHQ2-9): Low Risk (01/05/2024)   Depression (PHQ2-9)    PHQ-2 Score: 0  Alcohol Screen: Not on file  Housing: Unknown (01/05/2024)   Housing Stability Vital Sign    Unable to Pay for Housing in the Last Year: No    Number of Times Moved in the Last Year: Not on file    Homeless in the Last Year: No  Utilities: Not At Risk (01/05/2024)   AHC Utilities    Threatened with loss of utilities:  No  Health Literacy: Not on file         Objective:  BP 97/67   Pulse 80   Temp 98.8 F (37.1 C) (Oral)   Ht 5' 4 (1.626 m) Comment: per patient  Wt 171 lb (77.6 kg)   LMP 08/06/2024 (Exact Date)   SpO2 97% Comment: on RA  BMI 29.35 kg/m    Physical Exam Constitutional:      General:  She is not in acute distress.    Appearance: Normal appearance.  HENT:     Mouth/Throat:     Mouth: Mucous membranes are moist.  Cardiovascular:     Rate and Rhythm: Normal rate.  Pulmonary:     Effort: No respiratory distress.     Breath sounds: No wheezing or rales.  Musculoskeletal:     Right lower leg: No edema.     Left lower leg: No edema.  Skin:    General: Skin is warm.  Neurological:     Mental Status: She is alert and oriented to person, place, and time.  Psychiatric:        Mood and Affect: Mood normal.     Diagnostic Review:    Pft    Latest Ref Rng & Units 08/13/2022    1:44 PM 04/29/2014    4:20 PM  PFT Results  FVC-Pre L 2.13  2.41   FVC-Predicted Pre % 57  75   FVC-Post L 2.41  2.49   FVC-Predicted Post % 65  78   Pre FEV1/FVC % % 86  84   Post FEV1/FCV % % 83  92   FEV1-Pre L 1.83  2.02   FEV1-Predicted Pre % 61  76   FEV1-Post L 2.01  2.30   DLCO uncorrected ml/min/mmHg 18.60  21.30   DLCO UNC% % 85  86   DLCO corrected ml/min/mmHg 18.60    DLCO COR %Predicted % 85    DLVA Predicted % 116  129   TLC L 4.19  3.54   TLC % Predicted % 82  69   RV % Predicted % 104  66    13% change on FVC and PFT 07/2019      Results LHC 06/2024 LVEDP 16 mmHg  Normal coronary arteries         Assessment & Plan:   Assessment & Plan Moderate persistent asthma without complication Discussed the symptoms, etiology, pathophysiology, diagnostic test, treatment, flare ups,  prognosis of asthma Discussed the difference between maintenance and rescue inhalers.  Patient has been using rescue inhaler quite frequently Will start her on Symbicort  160, 2 puffs  twice a day.  Advised patient to rinse mouth after use She will continue to use albuterol  as needed Orders:   Pulmonary function test; Future   CBC with Differential   IgE; Future   DG Chest 2 View; Future   RESPIRATORY ALLERGY  PANEL REGION II W/ RFLX: [place]   budesonide -formoterol  (SYMBICORT ) 160-4.5 MCG/ACT inhaler; Inhale 2 puffs into the lungs in the morning and at bedtime.  Chronic sinusitis, unspecified location Takes Claritin as needed Symptoms well-controlled currently Orders:   CBC with Differential   IgE; Future   RESPIRATORY ALLERGY  PANEL REGION II W/ RFLX: [place]  History of pulmonary embolism Provoked, post partum      Thank you for the opportunity to take part in the care of ATHEENA SPANO   Return in about 6 weeks (around 10/01/2024).   Ankit Degregorio Pleas, MD [facility] Office: [phone]     [1]  Current Outpatient Medications:    albuterol  (PROAIR  HFA) 108 (90 Base) MCG/ACT inhaler, Inhale 2 puffs into the lungs every 4 (four) hours as needed for wheezing or shortness of breath., Disp: 1 Inhaler, Rfl: 1   aspirin  EC 81 MG tablet, Take 1 tablet (81 mg total) by mouth daily. Swallow whole., Disp: 30 tablet, Rfl: 12  atorvastatin  (LIPITOR) 40 MG tablet, Take 1 tablet (40 mg total) by mouth daily., Disp: 90 tablet, Rfl: 3   budesonide -formoterol  (SYMBICORT ) 160-4.5 MCG/ACT inhaler, Inhale 2 puffs into the lungs in the morning and at bedtime., Disp: 1 each, Rfl: 6   diltiazem  (CARDIZEM  CD) 120 MG 24 hr capsule, Take 1 capsule (120 mg total) by mouth 2 (two) times daily., Disp: 180 capsule, Rfl: 1   ferrous sulfate 325 (65 FE) MG tablet, Take 325 mg by mouth daily., Disp: , Rfl:    FIBER GUMMIES PO, Take 5 g by mouth daily., Disp: , Rfl:    levothyroxine  (SYNTHROID ) 75 MCG tablet, Take 75 mcg by mouth every morning., Disp: , Rfl:    loratadine (CLARITIN) 10 MG tablet, Take 10 mg by mouth daily as needed for allergies., Disp: ,  Rfl:    losartan-hydrochlorothiazide (HYZAAR) 50-12.5 MG tablet, Take 1 tablet by mouth daily., Disp: , Rfl:    Magnesium 200 MG CHEW, Chew 2 tablets by mouth daily., Disp: , Rfl:    Multiple Vitamin (MULTIVITAMIN) tablet, Take 1 tablet by mouth daily., Disp: , Rfl:    Probiotic Product (CULTURELLE PRO & PREBIOTIC PO), Take 2 capsules by mouth daily., Disp: , Rfl:    Rimegepant Sulfate (NURTEC) 75 MG TBDP, Take 1 tablet (75 mg total) by mouth as needed., Disp: 30 tablet, Rfl: 1   topiramate  (TOPAMAX ) 25 MG tablet, Take 1 tablet (25 mg total) by mouth 2 (two) times daily., Disp: 120 tablet, Rfl: 3   zolpidem (AMBIEN CR) 12.5 MG CR tablet, Take 12.5 mg by mouth at bedtime as needed for sleep., Disp: , Rfl:    nitroGLYCERIN  (NITROSTAT ) 0.4 MG SL tablet, Place 1 tablet (0.4 mg total) under the tongue every 5 (five) minutes as needed for chest pain. (Patient not taking: Reported on 08/20/2024), Disp: 25 tablet, Rfl: 3   potassium chloride  SA (KLOR-CON  M) 20 MEQ tablet, Take 1 tablet (20 mEq total) by mouth 2 (two) times daily for 4 days. (Patient not taking: Reported on 08/20/2024), Disp: 8 tablet, Rfl: 0  "

## 2024-08-21 LAB — RESPIRATORY ALLERGY PANEL REGION II W/ RFLX: ~~LOC~~
Allergen, A. alternata, m6: 5.4 kU/L — ABNORMAL HIGH
Allergen, Cedar tree, t12: 0.57 kU/L — ABNORMAL HIGH
Allergen, Comm Silver Birch, t9: 0.37 kU/L — ABNORMAL HIGH
Allergen, Cottonwood, t14: 1.53 kU/L — ABNORMAL HIGH
Allergen, D pternoyssinus,d7: 0.48 kU/L — ABNORMAL HIGH
Allergen, Mouse Urine Protein, e78: <0.1 kU/L
Allergen, Mulberry, t76: <0.1 kU/L
Allergen, Oak,t7: 1.14 kU/L — ABNORMAL HIGH
Allergen, P. notatum, m1: 0.3 kU/L — ABNORMAL HIGH
Aspergillus fumigatus, m3: 0.31 kU/L — ABNORMAL HIGH
Bermuda Grass: 1.06 kU/L — ABNORMAL HIGH
Box Elder IgE: 1.4 kU/L — ABNORMAL HIGH
CLADOSPORIUM HERBARUM (M2) IGE: 0.26 kU/L — ABNORMAL HIGH
COMMON RAGWEED (SHORT) (W1) IGE: 0.7 kU/L — ABNORMAL HIGH
Cat Dander: <0.1 kU/L
Class: 0
Class: 0
Class: 0
Class: 0
Class: 0
Class: 1
Class: 1
Class: 1
Class: 1
Class: 1
Class: 2
Class: 2
Class: 2
Class: 2
Class: 2
Class: 2
Class: 2
Class: 2
Class: 2
Class: 3
Cockroach: <0.1 kU/L
D. farinae: 0.53 kU/L — ABNORMAL HIGH
Dog Dander: <0.1 kU/L
Elm IgE: 3.47 kU/L — ABNORMAL HIGH
IgE (Immunoglobulin E), Serum: 113 kU/L
Johnson Grass: 0.9 kU/L — ABNORMAL HIGH
Pecan/Hickory Tree IgE: 0.39 kU/L — ABNORMAL HIGH
Rough Pigweed  IgE: 0.34 kU/L — ABNORMAL HIGH
Sheep Sorrel IgE: 1.51 kU/L — ABNORMAL HIGH
Timothy Grass: 1.57 kU/L — ABNORMAL HIGH

## 2024-08-21 LAB — INTERPRETATION:

## 2024-08-21 LAB — IGE: IgE (Immunoglobulin E), Serum: 113 kU/L

## 2024-09-06 ENCOUNTER — Other Ambulatory Visit: Payer: Self-pay | Admitting: Cardiology

## 2024-10-03 ENCOUNTER — Ambulatory Visit

## 2024-10-03 VITALS — BP 102/68 | HR 90 | Temp 98.0°F | Ht 64.0 in | Wt 168.4 lb

## 2024-10-03 DIAGNOSIS — J4540 Moderate persistent asthma, uncomplicated: Secondary | ICD-10-CM

## 2024-10-03 DIAGNOSIS — J329 Chronic sinusitis, unspecified: Secondary | ICD-10-CM | POA: Diagnosis not present

## 2024-10-03 DIAGNOSIS — Z9109 Other allergy status, other than to drugs and biological substances: Secondary | ICD-10-CM

## 2024-10-03 DIAGNOSIS — Z86711 Personal history of pulmonary embolism: Secondary | ICD-10-CM

## 2024-10-03 LAB — PULMONARY FUNCTION TEST
FEF 25-75 Post: 3.53 L/s
FEF 25-75 Pre: 2.64 L/s
FEF2575-%Change-Post: 33 %
FEF2575-%Pred-Post: 119 %
FEF2575-%Pred-Pre: 89 %
FEV1-%Change-Post: 5 %
FEV1-%Pred-Post: 81 %
FEV1-%Pred-Pre: 76 %
FEV1-Post: 2.39 L
FEV1-Pre: 2.26 L
FEV1FVC-%Change-Post: 5 %
FEV1FVC-%Pred-Pre: 103 %
FEV6-%Change-Post: 0 %
FEV6-%Pred-Post: 75 %
FEV6-%Pred-Pre: 75 %
FEV6-Post: 2.71 L
FEV6-Pre: 2.7 L
FEV6FVC-%Pred-Post: 102 %
FEV6FVC-%Pred-Pre: 102 %
FVC-%Change-Post: 0 %
FVC-%Pred-Post: 74 %
FVC-%Pred-Pre: 73 %
FVC-Post: 2.71 L
FVC-Pre: 2.7 L
Post FEV1/FVC ratio: 88 %
Post FEV6/FVC ratio: 100 %
Pre FEV1/FVC ratio: 84 %
Pre FEV6/FVC Ratio: 100 %

## 2024-10-03 MED ORDER — BUDESONIDE-FORMOTEROL FUMARATE 160-4.5 MCG/ACT IN AERO: 2.0000 | INHALATION_SPRAY | Freq: Two times a day (BID) | RESPIRATORY_TRACT | 6 refills | Status: AC

## 2024-10-03 MED ORDER — ALBUTEROL SULFATE HFA 108 (90 BASE) MCG/ACT IN AERS: 2.0000 | INHALATION_SPRAY | RESPIRATORY_TRACT | 1 refills | Status: AC | PRN

## 2024-10-03 MED ORDER — MONTELUKAST SODIUM 10 MG PO TABS: 10.0000 mg | ORAL_TABLET | Freq: Every day | ORAL | 11 refills | Status: AC

## 2024-10-03 MED ORDER — LORATADINE 10 MG PO TABS: 10.0000 mg | ORAL_TABLET | Freq: Every day | ORAL | 5 refills | Status: AC | PRN

## 2024-10-03 NOTE — Patient Instructions (Signed)
 Pre/post spiro performed today

## 2024-10-03 NOTE — Patient Instructions (Signed)
" °  VISIT SUMMARY: Today we discussed your persistent cough and allergy  symptoms related to your asthma. We reviewed your current inhaler regimen and considered additional medication to help manage your symptoms.  YOUR PLAN: MODERATE PERSISTENT ASTHMA: Your asthma symptoms are well-managed with your current inhaler regimen, though you experience a cough initially with Symbicort  use. The cough may be due to the inhaler or allergy -related asthma. -Start taking Singulair  daily to manage allergy -related asthma and reduce your cough. -If the cough persists despite Singulair , try using Albuterol  before Symbicort . -Continue using Symbicort  twice a day and Albuterol  as needed. -Monitor for any mood changes with Singulair  and stop taking it if you notice any mood instability.  ENVIRONMENTAL ALLERGIES: You have tested positive for multiple environmental allergies, including trees like [place], [place], and [employer]. These allergies may contribute to your asthma symptoms and cough. -Continue taking Claritin  daily for allergy  management. -Singulair  will also help control your allergy  symptoms and improve asthma management.    Contains text generated by [employer].   "

## 2024-10-03 NOTE — Progress Notes (Signed)
 Pre/post spiro performed today

## 2024-10-03 NOTE — Progress Notes (Signed)
 "  New Patient Pulmonology Office Visit   Subjective:  Patient ID: Kelsey Friedman, female    DOB: 1981-03-13  MRN: [id number]  Referred by: Pleas Newborn, MD  CC:  Chief Complaint  Patient presents with   Follow-up    Generic Symbicort  helping lungs feel more clear but does cause cough with a lot of mucus right after using the inhaler.  Review PFT from today.    Initially referred for uncontrolled asthma. Initially Seen in clinic in 07/2025. She has a history of pulmonary embolism on right upper lobe in 01/2020, immediately postpartum. Her family history includes a brother with a severe lung condition, possibly sarcoidosis. She works as a midwife.    Kelsey Friedman is a 44 y.o. female who is here for follow up.  Discussed the use of AI scribe software for clinical note transcription with the patient, who gave verbal consent to proceed.  History of Present Illness Kelsey Friedman is a 44 year old female with asthma who presents with persistent cough and allergy  symptoms.  She experiences a persistent cough, particularly after using her steroid inhaler, Symbicort . The cough occurs initially but she feels relief after coughing it up. She has not tried using albuterol  before Symbicort  to see if it reduces the cough. Overall she feels like her breathing has improved with symbicort . Completed PFT today  She has tested positive for multiple environmental allergies, including trees like [place], [place], and [employer]. She is currently taking Claritin  daily for allergies.      ROS Review of symptoms negative except mentioned above  Allergies: Patient has no known allergies. Current Medications[1] Past Medical History:  Diagnosis Date   Anxiety    Arrhythmia    Arthritis    Asthma    Cerebral aneurysm    CHF (congestive heart failure) (HCC)    PFO   Chiari I malformation (HCC)    HLD (hyperlipidemia)    HTN (hypertension)    Hypothyroidism    PE (pulmonary  embolism)    SOB (shortness of breath)    Stroke [facility])    Past Surgical History:  Procedure Laterality Date   CHOLECYSTECTOMY     EYE SURGERY     LEFT HEART CATH AND CORONARY ANGIOGRAPHY N/A 07/24/2024   Procedure: LEFT HEART CATH AND CORONARY ANGIOGRAPHY;  Surgeon: Ladona Heinz, MD;  Location: [facility];  Service: Cardiovascular;  Laterality: N/A;   PATENT FORAMEN OVALE(PFO) CLOSURE N/A 01/24/2024   Procedure: PATENT FORAMEN OVALE(PFO) CLOSURE;  Surgeon: Ladona Heinz, MD;  Location: [facility];  Service: Cardiovascular;  Laterality: N/A;   TONSILLECTOMY     Family History  Problem Relation Age of Onset   Hypertension Mother    Arthritis Mother    Heart attack Father        Decreased   Hyperlipidemia Father    Heart disease Father    Cancer Sister        hodgkin   Stroke Sister    Diabetes Brother    Emphysema Maternal Uncle    Allergies Daughter    Allergic rhinitis Neg Hx    Angioedema Neg Hx    Asthma Neg Hx    Eczema Neg Hx    Urticaria Neg Hx    Social History   Socioeconomic History   Marital status: Married    Spouse name: Not on file   Number of children: Not on file   Years of education: Not on file   Highest  education level: Not on file  Occupational History   Not on file  Tobacco Use   Smoking status: Never   Smokeless tobacco: Never  Vaping Use   Vaping status: Never Used  Substance and Sexual Activity   Alcohol use: Not Currently    Comment: socially   Drug use: No   Sexual activity: Not on file  Other Topics Concern   Not on file  Social History Narrative   Not on file   Social Drivers of Health   Tobacco Use: Low Risk (10/03/2024)   Patient History    Smoking Tobacco Use: Never    Smokeless Tobacco Use: Never    Passive Exposure: Not on file  Financial Resource Strain: Not on file  Food Insecurity: No Food Insecurity (01/05/2024)   Hunger Vital Sign    Worried About Running Out of Food in the Last Year: Never true    Ran  Out of Food in the Last Year: Never true  Transportation Needs: No Transportation Needs (01/05/2024)   PRAPARE - Administrator, Civil Service (Medical): No    Lack of Transportation (Non-Medical): No  Physical Activity: Not on file  Stress: Not on file  Social Connections: Unknown (12/22/2023)   Social Connection and Isolation Panel    Frequency of Communication with Friends and Family: More than three times a week    Frequency of Social Gatherings with Friends and Family: Once a week    Attends Religious Services: Not on Marketing Executive or Organizations: Not on file    Attends Banker Meetings: Not on file    Marital Status: Married  Intimate Partner Violence: Not At Risk (01/05/2024)   Humiliation, Afraid, Rape, and Kick questionnaire    Fear of Current or Ex-Partner: No    Emotionally Abused: No    Physically Abused: No    Sexually Abused: No  Depression (PHQ2-9): Low Risk (01/05/2024)   Depression (PHQ2-9)    PHQ-2 Score: 0  Alcohol Screen: Not on file  Housing: Unknown (01/05/2024)   Housing Stability Vital Sign    Unable to Pay for Housing in the Last Year: No    Number of Times Moved in the Last Year: Not on file    Homeless in the Last Year: No  Utilities: Not At Risk (01/05/2024)   AHC Utilities    Threatened with loss of utilities: No  Health Literacy: Not on file         Objective:  BP 102/68   Pulse 90   Temp 98 F (36.7 C) (Temporal)   Ht 5' 4 (1.626 m)   Wt 168 lb 6.4 oz (76.4 kg)   SpO2 98% Comment: room air  BMI 28.91 kg/m    Physical Exam Constitutional:      General: She is not in acute distress.    Appearance: Normal appearance.  HENT:     Mouth/Throat:     Mouth: Mucous membranes are moist.  Cardiovascular:     Rate and Rhythm: Normal rate.  Pulmonary:     Effort: No respiratory distress.     Breath sounds: No wheezing or rales.  Musculoskeletal:     Right lower leg: No edema.     Left lower leg: No  edema.  Skin:    General: Skin is warm.  Neurological:     Mental Status: She is alert and oriented to person, place, and time.  Psychiatric:  Mood and Affect: Mood normal.     Diagnostic Review:    Pft    Latest Ref Rng & Units 10/03/2024    2:45 PM 08/13/2022    1:44 PM 04/29/2014    4:20 PM  PFT Results  FVC-Pre L 2.70  P 2.13  2.41   FVC-Predicted Pre % 73  P 57  75   FVC-Post L 2.71  P 2.41  2.49   FVC-Predicted Post % 74  P 65  78   Pre FEV1/FVC % % 84  P 86  84   Post FEV1/FCV % % 88  P 83  92   FEV1-Pre L 2.26  P 1.83  2.02   FEV1-Predicted Pre % 76  P 61  76   FEV1-Post L 2.39  P 2.01  2.30   DLCO uncorrected ml/min/mmHg  18.60  21.30   DLCO UNC% %  85  86   DLCO corrected ml/min/mmHg  18.60    DLCO COR %Predicted %  85    DLVA Predicted %  116  129   TLC L  4.19  3.54   TLC % Predicted %  82  69   RV % Predicted %  104  66     P Preliminary result  5% BD response No obstruction  Labs Allergy  panel: Positive for multiple environmental allergens including oak, [place]wood, and [place]ory trees  Radiology Jan 2026: Chest X-ray: Normal       [facility] 06/2024 LVEDP 16 mmHg  Normal coronary arteries Results       Assessment & Plan:   Assessment & Plan Moderate persistent asthma without complication Discussed the symptoms, etiology, pathophysiology, diagnostic test, treatment, flare ups,  prognosis of asthma Symptoms better but fully controlled Will start singulair  Continue symbicort  She will Try using albuterol  before symbicort  if cough persists despite singulair  Orders:   budesonide -formoterol  (SYMBICORT ) 160-4.5 MCG/ACT inhaler; Inhale 2 puffs into the lungs in the morning and at bedtime.  Chronic sinusitis, unspecified location Start singulair  I discussed FDA black box warning on singulair  including but not limited to risk of serious neuropsychiatric events like depression, anxiety, suicidal thoughts, hallucinations, memory problems. Patient is  aware and is willing to try. Advised to alert us  and to stop singulair   if those Adverse effects are noticed.   Orders:   loratadine  (CLARITIN ) 10 MG tablet; Take 1 tablet (10 mg total) by mouth daily as needed for allergies.   montelukast  (SINGULAIR ) 10 MG tablet; Take 1 tablet (10 mg total) by mouth at bedtime.  History of pulmonary embolism Post partum     Environmental allergies       Thank you for the opportunity to take part in the care of ANUSHREE DORSI   Return in about 6 months (around 04/02/2025).   Preet Mangano Pleas, MD [facility] Office: [phone]     [1]  Current Outpatient Medications:    aspirin  EC 81 MG tablet, Take 1 tablet (81 mg total) by mouth daily. Swallow whole., Disp: 30 tablet, Rfl: 12   atorvastatin  (LIPITOR) 40 MG tablet, Take 1 tablet (40 mg total) by mouth daily., Disp: 90 tablet, Rfl: 3   diltiazem  (CARDIZEM  CD) 120 MG 24 hr capsule, TAKE 1 CAPSULE BY MOUTH 2 TIMES DAILY., Disp: 180 capsule, Rfl: 1   ferrous sulfate 325 (65 FE) MG tablet, Take 325 mg by mouth daily., Disp: , Rfl:    FIBER GUMMIES PO, Take 5 g by mouth daily., Disp: , Rfl:  levothyroxine  (SYNTHROID ) 75 MCG tablet, Take 75 mcg by mouth every morning., Disp: , Rfl:    losartan-hydrochlorothiazide (HYZAAR) 50-12.5 MG tablet, Take 1 tablet by mouth daily., Disp: , Rfl:    Magnesium 200 MG CHEW, Chew 2 tablets by mouth daily., Disp: , Rfl:    montelukast  (SINGULAIR ) 10 MG tablet, Take 1 tablet (10 mg total) by mouth at bedtime., Disp: 30 tablet, Rfl: 11   Multiple Vitamin (MULTIVITAMIN) tablet, Take 1 tablet by mouth daily., Disp: , Rfl:    nitroGLYCERIN  (NITROSTAT ) 0.4 MG SL tablet, Place 1 tablet (0.4 mg total) under the tongue every 5 (five) minutes as needed for chest pain., Disp: 25 tablet, Rfl: 3   Probiotic Product (CULTURELLE PRO & PREBIOTIC PO), Take 2 capsules by mouth daily., Disp: , Rfl:    Rimegepant Sulfate (NURTEC) 75 MG TBDP, Take 1 tablet (75 mg  total) by mouth as needed., Disp: 30 tablet, Rfl: 1   topiramate  (TOPAMAX ) 25 MG tablet, Take 1 tablet (25 mg total) by mouth 2 (two) times daily., Disp: 120 tablet, Rfl: 3   zolpidem (AMBIEN CR) 12.5 MG CR tablet, Take 12.5 mg by mouth at bedtime as needed for sleep., Disp: , Rfl:    albuterol  (PROAIR  HFA) 108 (90 Base) MCG/ACT inhaler, Inhale 2 puffs into the lungs every 4 (four) hours as needed for wheezing or shortness of breath., Disp: 1 each, Rfl: 1   budesonide -formoterol  (SYMBICORT ) 160-4.5 MCG/ACT inhaler, Inhale 2 puffs into the lungs in the morning and at bedtime., Disp: 1 each, Rfl: 6   loratadine  (CLARITIN ) 10 MG tablet, Take 1 tablet (10 mg total) by mouth daily as needed for allergies., Disp: 30 tablet, Rfl: 5  "

## 2024-11-02 ENCOUNTER — Ambulatory Visit: Admitting: Adult Health
